# Patient Record
Sex: Female | Born: 1975
Health system: Southern US, Community
[De-identification: ages and names within clinical notes are randomized; demographics above are authoritative.]

## PROBLEM LIST (undated history)

## (undated) ENCOUNTER — Inpatient Hospital Stay (HOSPITAL_COMMUNITY): Payer: Self-pay

## (undated) DIAGNOSIS — K219 Gastro-esophageal reflux disease without esophagitis: Secondary | ICD-10-CM

## (undated) DIAGNOSIS — M797 Fibromyalgia: Secondary | ICD-10-CM

## (undated) DIAGNOSIS — D509 Iron deficiency anemia, unspecified: Secondary | ICD-10-CM

## (undated) DIAGNOSIS — K5903 Drug induced constipation: Secondary | ICD-10-CM

## (undated) DIAGNOSIS — E041 Nontoxic single thyroid nodule: Secondary | ICD-10-CM

## (undated) DIAGNOSIS — J45909 Unspecified asthma, uncomplicated: Secondary | ICD-10-CM

## (undated) DIAGNOSIS — Z8719 Personal history of other diseases of the digestive system: Secondary | ICD-10-CM

## (undated) DIAGNOSIS — G43009 Migraine without aura, not intractable, without status migrainosus: Secondary | ICD-10-CM

## (undated) DIAGNOSIS — M51369 Other intervertebral disc degeneration, lumbar region without mention of lumbar back pain or lower extremity pain: Secondary | ICD-10-CM

## (undated) DIAGNOSIS — G894 Chronic pain syndrome: Secondary | ICD-10-CM

## (undated) DIAGNOSIS — I10 Essential (primary) hypertension: Secondary | ICD-10-CM

## (undated) DIAGNOSIS — Z973 Presence of spectacles and contact lenses: Secondary | ICD-10-CM

## (undated) DIAGNOSIS — R102 Pelvic and perineal pain: Secondary | ICD-10-CM

## (undated) DIAGNOSIS — N939 Abnormal uterine and vaginal bleeding, unspecified: Secondary | ICD-10-CM

## (undated) DIAGNOSIS — M542 Cervicalgia: Secondary | ICD-10-CM

## (undated) DIAGNOSIS — M1990 Unspecified osteoarthritis, unspecified site: Secondary | ICD-10-CM

## (undated) DIAGNOSIS — G43909 Migraine, unspecified, not intractable, without status migrainosus: Secondary | ICD-10-CM

## (undated) DIAGNOSIS — D259 Leiomyoma of uterus, unspecified: Secondary | ICD-10-CM

## (undated) DIAGNOSIS — E782 Mixed hyperlipidemia: Secondary | ICD-10-CM

## (undated) DIAGNOSIS — F411 Generalized anxiety disorder: Secondary | ICD-10-CM

## (undated) DIAGNOSIS — N83201 Unspecified ovarian cyst, right side: Secondary | ICD-10-CM

## (undated) DIAGNOSIS — E119 Type 2 diabetes mellitus without complications: Secondary | ICD-10-CM

## (undated) DIAGNOSIS — F419 Anxiety disorder, unspecified: Secondary | ICD-10-CM

## (undated) HISTORY — DX: Type 2 diabetes mellitus without complications: E11.9

## (undated) HISTORY — PX: SPINE SURGERY: SHX786

## (undated) HISTORY — DX: Migraine, unspecified, not intractable, without status migrainosus: G43.909

---
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
## 1998-09-30 ENCOUNTER — Encounter: Admission: RE | Admit: 1998-09-30 | Discharge: 1998-12-29 | Payer: Self-pay | Admitting: Family Medicine

## 1998-10-15 ENCOUNTER — Ambulatory Visit (HOSPITAL_COMMUNITY): Admission: RE | Admit: 1998-10-15 | Discharge: 1998-10-15 | Payer: Self-pay | Admitting: Family Medicine

## 1998-10-15 ENCOUNTER — Encounter: Payer: Self-pay | Admitting: Family Medicine

## 1998-12-12 ENCOUNTER — Ambulatory Visit (HOSPITAL_COMMUNITY): Admission: RE | Admit: 1998-12-12 | Discharge: 1998-12-12 | Payer: Self-pay | Admitting: Neurosurgery

## 1998-12-24 ENCOUNTER — Ambulatory Visit (HOSPITAL_COMMUNITY): Admission: RE | Admit: 1998-12-24 | Discharge: 1998-12-24 | Payer: Self-pay | Admitting: Neurosurgery

## 1999-01-07 ENCOUNTER — Ambulatory Visit (HOSPITAL_COMMUNITY): Admission: RE | Admit: 1999-01-07 | Discharge: 1999-01-07 | Payer: Self-pay | Admitting: Neurosurgery

## 2002-09-10 ENCOUNTER — Encounter: Admission: RE | Admit: 2002-09-10 | Discharge: 2002-10-05 | Payer: Self-pay | Admitting: Family Medicine

## 2003-01-30 ENCOUNTER — Other Ambulatory Visit: Admission: RE | Admit: 2003-01-30 | Discharge: 2003-01-30 | Payer: Self-pay | Admitting: Obstetrics and Gynecology

## 2003-06-07 ENCOUNTER — Other Ambulatory Visit: Admission: RE | Admit: 2003-06-07 | Discharge: 2003-06-07 | Payer: Self-pay | Admitting: Obstetrics and Gynecology

## 2003-09-18 ENCOUNTER — Other Ambulatory Visit: Admission: RE | Admit: 2003-09-18 | Discharge: 2003-09-18 | Payer: Self-pay | Admitting: Obstetrics and Gynecology

## 2004-03-04 ENCOUNTER — Ambulatory Visit (HOSPITAL_COMMUNITY): Admission: RE | Admit: 2004-03-04 | Discharge: 2004-03-04 | Payer: Self-pay | Admitting: Family Medicine

## 2004-03-16 ENCOUNTER — Encounter: Admission: RE | Admit: 2004-03-16 | Discharge: 2004-06-14 | Payer: Self-pay | Admitting: Family Medicine

## 2004-03-30 ENCOUNTER — Other Ambulatory Visit: Admission: RE | Admit: 2004-03-30 | Discharge: 2004-03-30 | Payer: Self-pay | Admitting: Family Medicine

## 2004-05-07 ENCOUNTER — Ambulatory Visit (HOSPITAL_COMMUNITY): Admission: RE | Admit: 2004-05-07 | Discharge: 2004-05-07 | Payer: Self-pay | Admitting: Family Medicine

## 2004-05-26 ENCOUNTER — Ambulatory Visit (HOSPITAL_COMMUNITY): Admission: RE | Admit: 2004-05-26 | Discharge: 2004-05-26 | Payer: Self-pay | Admitting: Family Medicine

## 2004-07-15 ENCOUNTER — Encounter: Admission: RE | Admit: 2004-07-15 | Discharge: 2004-09-25 | Payer: Self-pay | Admitting: Family Medicine

## 2004-08-14 ENCOUNTER — Ambulatory Visit (HOSPITAL_COMMUNITY): Admission: RE | Admit: 2004-08-14 | Discharge: 2004-08-14 | Payer: Self-pay | Admitting: Family Medicine

## 2004-09-03 ENCOUNTER — Ambulatory Visit: Payer: Self-pay | Admitting: Obstetrics & Gynecology

## 2004-09-10 ENCOUNTER — Ambulatory Visit: Payer: Self-pay | Admitting: Obstetrics & Gynecology

## 2004-09-14 ENCOUNTER — Other Ambulatory Visit: Admission: RE | Admit: 2004-09-14 | Discharge: 2004-09-14 | Payer: Self-pay | Admitting: Family Medicine

## 2004-09-17 ENCOUNTER — Ambulatory Visit: Payer: Self-pay | Admitting: *Deleted

## 2004-09-24 ENCOUNTER — Ambulatory Visit: Payer: Self-pay | Admitting: *Deleted

## 2004-09-24 ENCOUNTER — Inpatient Hospital Stay (HOSPITAL_COMMUNITY): Admission: AD | Admit: 2004-09-24 | Discharge: 2004-09-27 | Payer: Self-pay | Admitting: Family Medicine

## 2004-09-25 DIAGNOSIS — O1493 Unspecified pre-eclampsia, third trimester: Secondary | ICD-10-CM

## 2004-11-10 ENCOUNTER — Other Ambulatory Visit: Admission: RE | Admit: 2004-11-10 | Discharge: 2004-11-10 | Payer: Self-pay | Admitting: Family Medicine

## 2005-11-08 ENCOUNTER — Encounter: Admission: RE | Admit: 2005-11-08 | Discharge: 2005-11-16 | Payer: Self-pay | Admitting: Neurosurgery

## 2005-12-08 ENCOUNTER — Encounter: Admission: RE | Admit: 2005-12-08 | Discharge: 2005-12-08 | Payer: Self-pay | Admitting: Neurosurgery

## 2006-02-17 ENCOUNTER — Other Ambulatory Visit: Admission: RE | Admit: 2006-02-17 | Discharge: 2006-02-17 | Payer: Self-pay | Admitting: Family Medicine

## 2006-07-10 ENCOUNTER — Emergency Department (HOSPITAL_COMMUNITY): Admission: EM | Admit: 2006-07-10 | Discharge: 2006-07-10 | Payer: Self-pay | Admitting: Emergency Medicine

## 2006-09-30 ENCOUNTER — Emergency Department (HOSPITAL_COMMUNITY): Admission: EM | Admit: 2006-09-30 | Discharge: 2006-09-30 | Payer: Self-pay | Admitting: Emergency Medicine

## 2006-11-22 ENCOUNTER — Inpatient Hospital Stay (HOSPITAL_COMMUNITY): Admission: RE | Admit: 2006-11-22 | Discharge: 2006-11-25 | Payer: Self-pay | Admitting: Orthopedic Surgery

## 2006-11-22 ENCOUNTER — Encounter (INDEPENDENT_AMBULATORY_CARE_PROVIDER_SITE_OTHER): Payer: Self-pay | Admitting: *Deleted

## 2006-11-22 HISTORY — PX: POSTERIOR FUSION LUMBAR SPINE: SUR632

## 2007-06-01 ENCOUNTER — Encounter: Admission: RE | Admit: 2007-06-01 | Discharge: 2007-06-01 | Payer: Self-pay | Admitting: Orthopedic Surgery

## 2007-07-03 ENCOUNTER — Encounter: Admission: RE | Admit: 2007-07-03 | Discharge: 2007-07-03 | Payer: Self-pay | Admitting: Orthopedic Surgery

## 2008-04-29 ENCOUNTER — Encounter
Admission: RE | Admit: 2008-04-29 | Discharge: 2008-06-24 | Payer: Self-pay | Admitting: Physical Medicine & Rehabilitation

## 2008-04-30 ENCOUNTER — Ambulatory Visit: Payer: Self-pay | Admitting: Physical Medicine & Rehabilitation

## 2008-05-27 ENCOUNTER — Ambulatory Visit: Payer: Self-pay | Admitting: Physical Medicine & Rehabilitation

## 2008-06-24 ENCOUNTER — Ambulatory Visit: Payer: Self-pay | Admitting: Physical Medicine & Rehabilitation

## 2008-07-16 ENCOUNTER — Encounter
Admission: RE | Admit: 2008-07-16 | Discharge: 2008-10-14 | Payer: Self-pay | Admitting: Physical Medicine & Rehabilitation

## 2008-08-01 ENCOUNTER — Ambulatory Visit: Payer: Self-pay | Admitting: Physical Medicine & Rehabilitation

## 2008-11-05 ENCOUNTER — Ambulatory Visit: Payer: Self-pay | Admitting: Physical Medicine & Rehabilitation

## 2008-11-05 ENCOUNTER — Encounter
Admission: RE | Admit: 2008-11-05 | Discharge: 2009-02-03 | Payer: Self-pay | Admitting: Physical Medicine & Rehabilitation

## 2008-11-12 ENCOUNTER — Encounter
Admission: RE | Admit: 2008-11-12 | Discharge: 2009-01-14 | Payer: Self-pay | Admitting: Physical Medicine & Rehabilitation

## 2008-12-05 ENCOUNTER — Ambulatory Visit: Payer: Self-pay | Admitting: Physical Medicine & Rehabilitation

## 2009-02-18 ENCOUNTER — Encounter: Admission: RE | Admit: 2009-02-18 | Discharge: 2009-04-24 | Payer: Self-pay | Admitting: Neurology

## 2009-02-28 ENCOUNTER — Encounter
Admission: RE | Admit: 2009-02-28 | Discharge: 2009-03-05 | Payer: Self-pay | Admitting: Physical Medicine & Rehabilitation

## 2009-03-04 ENCOUNTER — Ambulatory Visit: Payer: Self-pay | Admitting: Physical Medicine & Rehabilitation

## 2009-06-06 ENCOUNTER — Ambulatory Visit: Payer: Self-pay | Admitting: Physical Medicine & Rehabilitation

## 2009-06-06 ENCOUNTER — Encounter
Admission: RE | Admit: 2009-06-06 | Discharge: 2009-06-06 | Payer: Self-pay | Admitting: Physical Medicine & Rehabilitation

## 2009-09-22 ENCOUNTER — Encounter
Admission: RE | Admit: 2009-09-22 | Discharge: 2009-09-24 | Payer: Self-pay | Admitting: Physical Medicine & Rehabilitation

## 2009-09-23 ENCOUNTER — Ambulatory Visit: Payer: Self-pay | Admitting: Physical Medicine & Rehabilitation

## 2009-10-04 HISTORY — PX: ANTERIOR FUSION CERVICAL SPINE: SUR626

## 2009-11-05 ENCOUNTER — Encounter: Admission: RE | Admit: 2009-11-05 | Discharge: 2009-11-05 | Payer: Self-pay | Admitting: Orthopedic Surgery

## 2009-11-11 ENCOUNTER — Encounter
Admission: RE | Admit: 2009-11-11 | Discharge: 2010-02-09 | Payer: Self-pay | Admitting: Physical Medicine & Rehabilitation

## 2009-12-19 ENCOUNTER — Encounter: Admission: RE | Admit: 2009-12-19 | Discharge: 2009-12-19 | Payer: Self-pay | Admitting: Orthopedic Surgery

## 2009-12-23 ENCOUNTER — Encounter (INDEPENDENT_AMBULATORY_CARE_PROVIDER_SITE_OTHER): Payer: Self-pay | Admitting: Orthopedic Surgery

## 2009-12-23 ENCOUNTER — Inpatient Hospital Stay (HOSPITAL_COMMUNITY): Admission: RE | Admit: 2009-12-23 | Discharge: 2009-12-24 | Payer: Self-pay | Admitting: Orthopedic Surgery

## 2009-12-23 HISTORY — PX: ANTERIOR CERVICAL DECOMP/DISCECTOMY FUSION: SHX1161

## 2010-10-04 HISTORY — PX: CERVICAL SPINE SURGERY: SHX589

## 2010-10-25 ENCOUNTER — Encounter: Payer: Self-pay | Admitting: Family Medicine

## 2010-10-28 ENCOUNTER — Encounter
Admission: RE | Admit: 2010-10-28 | Discharge: 2010-10-28 | Payer: Self-pay | Source: Home / Self Care | Attending: Orthopaedic Surgery | Admitting: Orthopaedic Surgery

## 2010-12-08 ENCOUNTER — Ambulatory Visit: Payer: PRIVATE HEALTH INSURANCE | Attending: Orthopedic Surgery | Admitting: Physical Therapy

## 2010-12-08 ENCOUNTER — Other Ambulatory Visit: Payer: Self-pay | Admitting: Orthopedic Surgery

## 2010-12-08 DIAGNOSIS — R5381 Other malaise: Secondary | ICD-10-CM | POA: Insufficient documentation

## 2010-12-08 DIAGNOSIS — M542 Cervicalgia: Secondary | ICD-10-CM

## 2010-12-08 DIAGNOSIS — M25519 Pain in unspecified shoulder: Secondary | ICD-10-CM | POA: Insufficient documentation

## 2010-12-08 DIAGNOSIS — M6281 Muscle weakness (generalized): Secondary | ICD-10-CM | POA: Insufficient documentation

## 2010-12-08 DIAGNOSIS — IMO0001 Reserved for inherently not codable concepts without codable children: Secondary | ICD-10-CM | POA: Insufficient documentation

## 2010-12-10 ENCOUNTER — Ambulatory Visit
Admission: RE | Admit: 2010-12-10 | Discharge: 2010-12-10 | Disposition: A | Discharge status: Home / Self Care | Payer: PRIVATE HEALTH INSURANCE | Source: Ambulatory Visit | Attending: Orthopedic Surgery | Admitting: Orthopedic Surgery

## 2010-12-10 DIAGNOSIS — M542 Cervicalgia: Secondary | ICD-10-CM

## 2010-12-11 ENCOUNTER — Ambulatory Visit: Payer: PRIVATE HEALTH INSURANCE | Admitting: Physical Therapy

## 2010-12-14 ENCOUNTER — Ambulatory Visit: Payer: PRIVATE HEALTH INSURANCE | Admitting: Physical Therapy

## 2010-12-16 ENCOUNTER — Ambulatory Visit: Payer: PRIVATE HEALTH INSURANCE | Admitting: Physical Therapy

## 2010-12-21 ENCOUNTER — Ambulatory Visit: Payer: PRIVATE HEALTH INSURANCE | Admitting: Physical Therapy

## 2010-12-22 ENCOUNTER — Encounter: Payer: PRIVATE HEALTH INSURANCE | Admitting: Physical Therapy

## 2010-12-23 ENCOUNTER — Ambulatory Visit: Payer: PRIVATE HEALTH INSURANCE | Admitting: Physical Therapy

## 2010-12-27 LAB — COMPREHENSIVE METABOLIC PANEL
AST: 20 U/L (ref 0–37)
CO2: 24 mEq/L (ref 19–32)
Chloride: 107 mEq/L (ref 96–112)
GFR calc Af Amer: >60 mL/min (ref >60)
GFR calc non Af Amer: >60 mL/min (ref >60)
Glucose, Bld: 74 mg/dL (ref 70–99)
Sodium: 139 mEq/L (ref 135–145)

## 2010-12-27 LAB — URINALYSIS, ROUTINE W REFLEX MICROSCOPIC
Bilirubin Urine: NEGATIVE
Ketones, ur: NEGATIVE mg/dL
Nitrite: POSITIVE — AB
Specific Gravity, Urine: 1.015 (ref 1.005–1.030)
Urobilinogen, UA: 1 mg/dL (ref 0.0–1.0)

## 2010-12-27 LAB — CBC
Hemoglobin: 13.3 g/dL (ref 12.0–15.0)
MCHC: 33.8 g/dL (ref 30.0–36.0)
Platelets: 368 10*3/uL (ref 150–400)
RBC: 4.67 MIL/uL (ref 3.87–5.11)
RDW: 13.5 % (ref 11.5–15.5)
WBC: 8 10*3/uL (ref 4.0–10.5)

## 2010-12-27 LAB — DIFFERENTIAL
Eosinophils Absolute: 0.2 10*3/uL (ref 0.0–0.7)
Lymphocytes Relative: 37 % (ref 12–46)
Lymphs Abs: 2.9 10*3/uL (ref 0.7–4.0)
Neutrophils Relative %: 52 % (ref 43–77)

## 2010-12-27 LAB — BASIC METABOLIC PANEL
BUN: 7 mg/dL (ref 6–23)
CO2: 25 mEq/L (ref 19–32)
Calcium: 8.7 mg/dL (ref 8.4–10.5)
Creatinine, Ser: 0.81 mg/dL (ref 0.4–1.2)
Glucose, Bld: 162 mg/dL — ABNORMAL HIGH (ref 70–99)

## 2010-12-27 LAB — GLUCOSE, CAPILLARY
Glucose-Capillary: 85 mg/dL (ref 70–99)
Glucose-Capillary: 87 mg/dL (ref 70–99)
Glucose-Capillary: 95 mg/dL (ref 70–99)

## 2010-12-27 LAB — URINE MICROSCOPIC-ADD ON

## 2010-12-27 LAB — TYPE AND SCREEN: Antibody Screen: NEGATIVE

## 2010-12-27 LAB — APTT: aPTT: 29 seconds (ref 24–37)

## 2010-12-27 LAB — VITAMIN D 25 HYDROXY (VIT D DEFICIENCY, FRACTURES): Vit D, 25-Hydroxy: 24 ng/mL — ABNORMAL LOW (ref 30–89)

## 2010-12-27 LAB — HEMOGLOBIN A1C
Hgb A1c MFr Bld: 7.2 % — ABNORMAL HIGH (ref 4.6–6.1)
Mean Plasma Glucose: 160 mg/dL

## 2011-01-25 ENCOUNTER — Encounter: Payer: Self-pay | Admitting: Nurse Practitioner

## 2011-01-25 DIAGNOSIS — E1165 Type 2 diabetes mellitus with hyperglycemia: Secondary | ICD-10-CM

## 2011-01-25 DIAGNOSIS — M545 Low back pain: Secondary | ICD-10-CM | POA: Insufficient documentation

## 2011-01-25 DIAGNOSIS — E118 Type 2 diabetes mellitus with unspecified complications: Secondary | ICD-10-CM | POA: Insufficient documentation

## 2011-01-25 DIAGNOSIS — G43909 Migraine, unspecified, not intractable, without status migrainosus: Secondary | ICD-10-CM

## 2011-01-25 DIAGNOSIS — I10 Essential (primary) hypertension: Secondary | ICD-10-CM

## 2011-01-25 DIAGNOSIS — K589 Irritable bowel syndrome without diarrhea: Secondary | ICD-10-CM | POA: Insufficient documentation

## 2011-02-12 ENCOUNTER — Ambulatory Visit (HOSPITAL_COMMUNITY)
Admission: RE | Admit: 2011-02-12 | Discharge: 2011-02-12 | Disposition: A | Discharge status: Home / Self Care | Payer: PRIVATE HEALTH INSURANCE | Source: Ambulatory Visit | Attending: Orthopedic Surgery | Admitting: Orthopedic Surgery

## 2011-02-12 ENCOUNTER — Encounter (HOSPITAL_COMMUNITY)
Admission: RE | Admit: 2011-02-12 | Discharge: 2011-02-12 | Disposition: A | Discharge status: Home / Self Care | Payer: PRIVATE HEALTH INSURANCE | Source: Ambulatory Visit | Attending: Orthopedic Surgery | Admitting: Orthopedic Surgery

## 2011-02-12 ENCOUNTER — Other Ambulatory Visit (HOSPITAL_COMMUNITY): Payer: Self-pay | Admitting: Orthopedic Surgery

## 2011-02-12 DIAGNOSIS — Z01818 Encounter for other preprocedural examination: Secondary | ICD-10-CM | POA: Insufficient documentation

## 2011-02-12 DIAGNOSIS — M4802 Spinal stenosis, cervical region: Secondary | ICD-10-CM | POA: Insufficient documentation

## 2011-02-12 DIAGNOSIS — Z01812 Encounter for preprocedural laboratory examination: Secondary | ICD-10-CM | POA: Insufficient documentation

## 2011-02-12 DIAGNOSIS — M5020 Other cervical disc displacement, unspecified cervical region: Secondary | ICD-10-CM

## 2011-02-12 LAB — URINALYSIS, ROUTINE W REFLEX MICROSCOPIC
Ketones, ur: NEGATIVE mg/dL
Nitrite: NEGATIVE
Protein, ur: NEGATIVE mg/dL
Urobilinogen, UA: 0.2 mg/dL (ref 0.0–1.0)
pH: 5.5 (ref 5.0–8.0)

## 2011-02-12 LAB — DIFFERENTIAL
Basophils Relative: 1 % (ref 0–1)
Eosinophils Absolute: 0.2 10*3/uL (ref 0.0–0.7)
Eosinophils Relative: 2 % (ref 0–5)
Monocytes Relative: 7 % (ref 3–12)
Neutrophils Relative %: 50 % (ref 43–77)

## 2011-02-12 LAB — COMPREHENSIVE METABOLIC PANEL
AST: 36 U/L (ref 0–37)
Albumin: 4.3 g/dL (ref 3.5–5.2)
Alkaline Phosphatase: 103 U/L (ref 39–117)
Chloride: 98 mEq/L (ref 96–112)
GFR calc Af Amer: >60 mL/min (ref >60)
Potassium: 4.1 mEq/L (ref 3.5–5.1)
Sodium: 140 mEq/L (ref 135–145)
Total Bilirubin: 0.2 mg/dL — ABNORMAL LOW (ref 0.3–1.2)
Total Protein: 7.5 g/dL (ref 6.0–8.3)

## 2011-02-12 LAB — CBC
MCH: 26.2 pg (ref 26.0–34.0)
MCHC: 32.4 g/dL (ref 30.0–36.0)
Platelets: 438 10*3/uL — ABNORMAL HIGH (ref 150–400)
RBC: 4.65 MIL/uL (ref 3.87–5.11)
RDW: 13.2 % (ref 11.5–15.5)

## 2011-02-12 LAB — HCG, SERUM, QUALITATIVE: Preg, Serum: NEGATIVE

## 2011-02-16 ENCOUNTER — Inpatient Hospital Stay (HOSPITAL_COMMUNITY)
Admission: RE | Admit: 2011-02-16 | Discharge: 2011-02-17 | DRG: 491 | Disposition: A | Discharge status: Home / Self Care | Payer: PRIVATE HEALTH INSURANCE | Source: Ambulatory Visit | Attending: Orthopedic Surgery | Admitting: Orthopedic Surgery

## 2011-02-16 ENCOUNTER — Inpatient Hospital Stay (HOSPITAL_COMMUNITY): Payer: PRIVATE HEALTH INSURANCE

## 2011-02-16 DIAGNOSIS — Z880 Allergy status to penicillin: Secondary | ICD-10-CM

## 2011-02-16 DIAGNOSIS — E119 Type 2 diabetes mellitus without complications: Secondary | ICD-10-CM | POA: Diagnosis present

## 2011-02-16 DIAGNOSIS — I10 Essential (primary) hypertension: Secondary | ICD-10-CM | POA: Diagnosis present

## 2011-02-16 DIAGNOSIS — J45909 Unspecified asthma, uncomplicated: Secondary | ICD-10-CM | POA: Diagnosis present

## 2011-02-16 DIAGNOSIS — E669 Obesity, unspecified: Secondary | ICD-10-CM | POA: Diagnosis present

## 2011-02-16 DIAGNOSIS — M5020 Other cervical disc displacement, unspecified cervical region: Principal | ICD-10-CM | POA: Diagnosis present

## 2011-02-16 HISTORY — PX: CERVICAL SPINE SURGERY: SHX589

## 2011-02-16 LAB — GLUCOSE, CAPILLARY
Glucose-Capillary: 127 mg/dL — ABNORMAL HIGH (ref 70–99)
Glucose-Capillary: 187 mg/dL — ABNORMAL HIGH (ref 70–99)

## 2011-02-16 NOTE — Procedures (Signed)
NAME:  Natalie Yoder, Natalie Yoder                ACCOUNT NO.:  [id number]   MEDICAL RECORD NO.:  [id number]          PATIENT TYPE:  REC   LOCATION:  [facility]                          FACILITY:  [facility]   PHYSICIAN:  Erick Colace, M.D.DATE OF BIRTH:  1976/04/21   DATE OF PROCEDURE:  05/27/2008  DATE OF DISCHARGE:                               OPERATIVE REPORT   PROCEDURE:  Right sacroiliac injection under fluoroscopic guidance   INDICATIONS:  Lumbar post-laminectomy pain in the sacroiliac area and  history of lumbar fusion.   Informed consent obtained after describing risks and benefits of the  procedure with the patient.  These include bleeding, bruising,  infection, and loss of bowel or bladder function.  She elects to proceed  and has given written consent.  The patient does have pain that is only  partially responsive to medication management and other conservative  care.   The patient was placed prone on the fluoroscopy table.  Betadine  prepped, sterile draped.  A 25-gauge 1-1/2-inch needle was used to  anesthetize the skin and subcu tissue with 1% lidocaine x2 mL.  Then, a  25-gauge 3-1/2-inch spinal needle was inserted under the fluoroscopic  guidance, starting at the right SI joint.  The needle was, however, too  short to reach the intended target and therefore switched out to 22-  gauge 5-inch, which was inserted at the right SI joint.  AP, lateral,  and oblique imaging utilized.  Omnipaque 180 demonstrated inferior  aspect calcification, but also a capsular tear in the anterior and  inferior.  Then, a solution containing 1 mL of 2% MPF lidocaine and 1.5  mL of 40 mg/mL Depo-Medrol injected.  The patient tolerated the  procedure well.  Post-injection instructions were given.  Post-procedure  instructions were given.  I will see her back.  Pre-injection pain level  is 7/10.  Post-injection is 2-3/10.  We will repeat in 1 month.      Erick Colace, M.D.  Electronically  Signed     AEK/MEDQ  D:  05/27/2008 12:07:35  T:  05/28/2008 03:40:04  Job:  [id number]   cc:   Nelda Severe, MD  Fax: [phone]

## 2011-02-16 NOTE — Procedures (Signed)
NAME:  Natalie Yoder, Natalie Yoder                ACCOUNT NO.:  [id number]   MEDICAL RECORD NO.:  [id number]          PATIENT TYPE:  REC   LOCATION:  [facility]                          FACILITY:  [facility]   PHYSICIAN:  Erick Colace, M.D.DATE OF BIRTH:  05-04-1976   DATE OF PROCEDURE:  11/05/2008  DATE OF DISCHARGE:                               OPERATIVE REPORT   PROCEDURE:  This is a right hip trochanteric bursa injection.   INDICATION:  Trochanteric bursitis.  Pain was only partial process to  medication management and other conservative care, interferes with  sleep.   Informed consent was obtained after describing the risks and benefits of  the procedure with the patient.  These include bleeding, bruising,  infection.  She elects to proceed.  The patient placed in a left lateral  decubitus position.  The area marked and prepped with Betadine, alcohol,  entered with 25-gauge 3-inch spinal needle inserted to bone contact and  slightly withdrawn.  Solution containing 1 mL of 40 mg/mL Depo-Medrol  and 4 mL of 1% lidocaine were injected, after negative draw back of  blood.  The patient tolerated the procedure well.  Pre and post-  injection instructions given.  Return in 1 month for followup.  We will  send to Physical Therapy for tensor fascia lata stretching.      Erick Colace, M.D.  Electronically Signed     AEK/MEDQ  D:  11/05/2008 17:10:11  T:  11/06/2008 05:03:52  Job:  [id number]

## 2011-02-16 NOTE — Assessment & Plan Note (Signed)
Ms. Peraza is a 35 year old female with lumbar postlaminectomy syndrome  as well as right chronic trochanteric bursitis.  She has finished up  physical therapy at [facility].  She is quite happy with  how she is doing overall.  Her pain has been well controlled.  She has  been walking without the cane now, at times.  She still states the pain  is 8/10, but she is independent with all self-care, the only thing that  she has trouble with the shopping.  Her sleep is poor.  The pain is  described as constant aching, improves with heat and medications.  Relief from meds is fair.   REVIEW OF SYSTEMS:  Otherwise negative.   PHYSICAL EXAMINATION:  VITAL SIGNS:  Blood pressure is 117/75, pulse 99,  respirations 80, and O2 saturation is 98% room air.  GENERAL:  No acute distress.  Orientation x3.  Affect bright and alert.  Ambulation was with a cane.  EXTREMITIES:  Without edema.  Her motor strength is normal in the upper  and lower extremities.  Lower extremity range of motion is normal.  She  has mild tenderness over the right trochanteric bursa.  She has mild  tenderness in the low back area.  She has no evidence of scoliosis.   IMPRESSION:  1. Lumbar postlaminectomy syndrome.  2. Right trochanteric bursitis, improved.   PLAN:  1. Continue her current medications, which consist of tramadol 50 mg      p.o. t.i.d. during the day and Darvocet-N 100 2 p.o. at bedtime.  2. Continue Voltaren Gel to hip q.i.d.  3. Continue home exercise program.  Encouraged ambulation.   I will see her back in 3 months for evaluation.      Erick Colace, M.D.  Electronically Signed     AEK/MedQ  D:  03/04/2009 14:40:25  T:  03/05/2009 04:48:49  Job #:  [id number]

## 2011-02-16 NOTE — Assessment & Plan Note (Signed)
Natalie Yoder follows up today.  She indicates that she did have much relief  with the medial branch blocks on the right, however, immediately getting  up table, she went from 7-0, but she states that even just walking to be  checked out, her pain returned.  She has had no new medical issues in  the interval time.  Her main complaint in addition to her back pain in  this visit is problem falling asleep.  Denies any excessive caffeine  intake.  Her average pain is 8/10 to 9/10.  Her sleep is poor and it is  a problem falling asleep as well as to a lesser extent staying asleep.   Pain is worse with walking, bending.  Her ambulation is with a cane  sometimes uses no cane.  She thinks she just feels more secure with the  cane, it does not really help with her pain.  She can climb steps.  She  drives.  She needs in assist with certain household duties and shopping.  She has been on disability since June 2009.   PAST SURGICAL HISTORY:  Lumbar fusion in 2008, L5-S1.   Other past medical history, migraine headaches seen by Neurology for  this.   SOCIAL HISTORY:  Lives with her 35-year-old daughter   Review of systems is otherwise negative.   PHYSICAL EXAMINATION:  VITAL SIGNS:  Blood pressure 148/77, pulse 92,  respirations 18, O2 100% on room air.  GENERAL:  No acute stress.  Orientation x3.  Affect is tired, but  otherwise appropriate.  EXTREMITIES:  His upper and lower extremity strength is normal.  Deep  tendon reflexes are 1+ bilateral upper and lower extremities.  Upper and  lower extremity range of motion, normal.  She has some tenderness to  palpation on right hip area over the greater trochanter.  No pain with  hip internal-external rotation.   IMPRESSION:  1. Lumbar post-laminectomy syndrome.  2. Right trochanteric bursitis.   PLAN:  We will go ahead and inject her right hip today.  It her pain  does persist despite medication management that includes tramadol 50  t.i.d. and  Darvocet-N 100 two nightly.   We will continue current meds.  We discussed trying something stronger  at night such as hydrocodone; however, she states that she gets  nauseated with the stronger narcotics.   Certainly no evidence of drug-seeking behavior.  If the injection does  not help in terms of relieving pain at night.  Consider addition of  trazodone.      Erick Colace, M.D.  Electronically Signed     AEK/MedQ  D:  11/05/2008 17:08:08  T:  11/06/2008 05:06:42  Job #:  [id number]

## 2011-02-16 NOTE — Procedures (Signed)
NAME:  Natalie Yoder, Natalie Yoder                ACCOUNT NO.:  [id number]   MEDICAL RECORD NO.:  [id number]          PATIENT TYPE:  REC   LOCATION:  [facility]                          FACILITY:  [facility]   PHYSICIAN:  Erick Colace, M.D.DATE OF BIRTH:  [birth year]   DATE OF PROCEDURE:  DATE OF DISCHARGE:                               OPERATIVE REPORT   PROCEDURE:  Right sacroiliac injection under fluoroscopic guidance.   INDICATIONS:  Lumbar postlaminectomy pain in the sacroiliac area,  history of lumbar fusion.   Informed consent was obtained after describing risks and benefits of the  procedure to the patient.  These include bleeding, bruising, infection,  temporary or permanent paralysis.  She elects to proceed and has given  written consent.  Her pain was only partially responsive to medication  management and other conservative care.  She had relief of right buttock  pain following sacroiliac injection last month, went from 7 to a 3.   The patient was placed prone on the fluoroscopy table.  Betadine prep,  sterile drape.  A 25-gauge 1-1/2-inch needle was used to anesthetize the  skin and subcu tissue, 1% lidocaine x2 mL.  Then, a 22-gauge 5-inch  spinal needle was inserted under fluoroscopic guidance into the right  sacroiliac joint.  AP, lateral, and oblique imaging utilized.  Omnipaque  180 demonstrated no intravascular uptake.  There was evidence of  capsular tear anterior and inferior.  Solution containing 1 mL of 2% MPF  lidocaine and 1 mL of 40 mg/mL Depo-Medrol injected.  The patient  tolerated the procedure well.  Post injection instructions were given.  We will have the patient return in 1 month for facet injections followed  by PT.      Erick Colace, M.D.  Electronically Signed     AEK/MEDQ  D:  06/24/2008 12:42:39  T:  06/25/2008 01:39:02  Job:  [id number]

## 2011-02-16 NOTE — Assessment & Plan Note (Signed)
A 35 year old female with lumbar post laminectomy syndrome as well as  chronic right trochanteric bursitis.  She has been going through  physical therapy at [facility].  She has had 9  treatments.  She has been having fair relief with this, some increase in  mobility.  She is doing some increased exercise such as stationary  bicycling which flares up her pain, causes a little bit of shakiness in  her right hip and thigh muscles.   PAST SURGICAL HISTORY:  Lumbar fusion 2008, at L5-S1 level.   CURRENT PAIN MEDICATIONS:  1. Tramadol 50 mg p.o. t.i.d. during the day.  2. Darvocet-N 100 two p.o. nightly.  3. More recently she was started Voltaren Gel which has been helping      on her hip q.i.d.   Examination, obese female in no acute distress.  Mood and affect  appropriate.  Orientation x3.   Extremity, strength in upper and lower extremities are normal.  Her  lower extremity range of motion normal at the hip, knee, and ankle.  She  has tenderness over the right troch bursa.  She has tenderness in the  low back area.   IMPRESSION:  1. Lumbar post laminectomy syndrome.  2. Right trochanteric bursitis somewhat improved.   PLAN:  1. We will continue current medications.  2. We will go ahead and continue with some physical therapy.  She has      trialed some electrical stimulation, we will see if she can get a      home E-Stim device through PT, have her get about 4 more sessions.      I will see her back in a couple months and followup on her      medications.  Discussed the plan with the patient.  She agrees with      the plan.  She does have a form from physical therapy for me to      decide today which I did with some changes in her treatment and      plan.      Erick Colace, M.D.  Electronically Signed     AEK/MedQ  D:  12/05/2008 12:35:33  T:  12/06/2008 00:21:18  Job #:  [id number]

## 2011-02-16 NOTE — Group Therapy Note (Signed)
CONSULT REQUESTED BY:  Nelda Severe, MD   Consult requested for the evaluation of pain status post lumbosacral  fusion.   Ms. Natalie Yoder is a 35 year old African American female who had onset  of pain sometime in January 2007.  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  She came home  one day, sat down, and could not get up because of back pain.  Her pain  prior to her operation was across both the right and left sides of the  back.  Preoperatively, she tried physical therapy as well as epidural  steroid injection, neither of which was successful.  She then had  surgical consultation, initially with Dr. Lovell Sheehan, who did not feel  surgery would be helpful for her condition and then with Dr. Nelda Severe, who scheduled the patient for L5-S1 fusion, transforaminal lumbar  interbody.  She had a discogram preoperatively showing discordant pain  at L4-L5, concordant pain with a little pressure at L5-S1, and  concordant pain with high pressure at L3-L4.  Post-discectomy CT showed  diffuse disruption at L5-S1.  Postoperatively, her pain changed in terms  of location and overall reduced in intensity.  While she sometimes used  the wheelchair preoperatively, she has been able to use her cane  postoperatively.  Her pain is also now more on the right side and down  into the right thigh but not below the knee.  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  She has no plans of returning to work but has goals of being more  active to play with her 40-year-old child.  Her average pain is 6 to 7  out of 10, currently 5-6, described as sharp, stabbing, and interfering  with activity at 8/10 level, worse with walking, bending, and standing;  improves with medications.  Relief from medications is fair.  She uses a  cane.  She drives when she needs to.   She needs some assistance with  household duties and shopping.   REVIEW OF SYSTEMS:  Positive for depression and anxiety.  She also has  review of systems positive for nausea, for which she takes Reglan, and  constipation, for which uses fiber.  Her other physicians include  primary care doctor at [facility] practice, Dr. Paulene Floor.   OTHER PAST MEDICAL HISTORY:  History of headaches, question migraines,  has seen Dr. Nash Shearer but since he left will be seeing another physician  at [facility].  Dr. Nash Shearer has been prescribing Cymbalta,  alprazolam, metoclopramide, sumatriptan, and topiramate.   Other past medical history also significant for diabetes, on metformin,  Actos, and glimepiride.  She is hypertensive, takes atenolol.   For her pain through Dr. Toni Arthurs office she has been taking gabapentin  400 t.i.d., diclofenac 75 b.i.d., and hydrocodone 5/325 q.i.d.  Her last  reported dosages of hydrocodone is on April 30, 2008, which should show  up on the urine drug screen today as well as hydrocodone and  hydromorphone.  Alprazolam should also show up.   SOCIAL HISTORY:  Lives with her parents and her daughter, who is 68 years  old.   FAMILY HISTORY:  Diabetes and high blood pressure.   PHYSICAL EXAMINATION:  Her blood pressure is 130/80, pulse 76,  respirations 20, and O2 sat 100% on room air.  This is an obese female in no acute distress.  Orientation x3.  Affect  is depressed, tired appearing, favors the right lower extremity when she  ambulates using a cane, but she can ambulate without a cane as well.  Her deep tendon reflexes are 2+ bilateral biceps, triceps,  brachioradialis, patellar, and Achilles.  Her sensation is normal,  bilateral C5, C6, C7, C8, T1, L2, L3, L4, L5, and S1 dermatomes.  Her  peripheral pulses are normal.  She has no evidence of peripheral edema.  She has normal range of motion, bilateral upper and lower extremities.  She has normal neck range of motion.  Her  lumbar spine range of motion  is limited to 50% forward flexion, 25% extension, and 25% lateral  bending both to the right and to the left.  She has no new right-sided  pain with lateral bending to either direction.  Her hip has no  tenderness to palpation.  Deep tendon reflexes are normal, bilateral  biceps, triceps, brachioradialis, patellar, and Achilles.   IMPRESSION:  Lumbar postlaminectomy syndrome.  I explained the more  common causes of pain after fusion, as I do believe her pain has changed  post fusion.  She most likely has a sacroiliac arthropathy, and we will  schedule her for a sacroiliac injection to further evaluate for this.  Alternatively, she may have some pain emanating from the right side, L3-  L4 and L4-L5 facets.  She may also have some myofascial pain related to  postoperative state.  In addition, preoperatively she did have  concordant pain but with high pressures at L3-L4, although this is a  less likely pain generator given her pain appears to be lower in the  lumbar area.   Thank you for this interesting consultation.  I will keep you apprised  of her progress.  Once we have a better idea on pain generator, we will  try to tailor a rehabilitation program to maximize her functional  improvement.  We will not prescribe any narcotic analgesics until urine  drug screen is obtained and interpreted.  If it does come back  consistent with medications reported, then I will be able to resume the  prescription of her hydrocodone, with the overall goal of reducing over  time.      Erick Colace, M.D.  Electronically Signed     AEK/MedQ  D:  04/30/2008 17:22:11  T:  05/01/2008 08:16:52  Job #:  [id number]   cc:   Nelda Severe, MD  Fax: [phone]

## 2011-02-16 NOTE — Procedures (Signed)
NAME:  Natalie Yoder, Natalie Yoder                ACCOUNT NO.:  [id number]   MEDICAL RECORD NO.:  [id number]          PATIENT TYPE:  REC   LOCATION:  [facility]                          FACILITY:  [facility]   PHYSICIAN:  Erick Colace, M.D.DATE OF BIRTH:  May 23, 1976   DATE OF PROCEDURE:  DATE OF DISCHARGE:                               OPERATIVE REPORT   PROCEDURES:  Right L5 dorsal ramus injection, right L4 medial branch  block, and right L3 medial branch block.   INDICATIONS:  Post fusion pain.  No improvement after sacroiliac  injection.  L5-S1 fusion with spondylosis above the level of fusion.  Pain interferes with self-care mobility and is only partially responsive  to medication management including narcotic analgesics.    Informed consent was obtained after describing risks and benefits of the  procedure with the patient.  These include bleeding, bruising, and  infections.  She elects to proceed and has given written consent.  The  patient placed prone on the fluoroscopy table.  Betadine prep, sterilely  draped.  A 25-gauge, 1-1/2-inch needle was used to anesthetize the skin  and subcu tissue, 1% lidocaine x2 cc, and 22-gauge, 5-inch spinal needle  was inserted under fluoroscopic guidance, first starting at the right  L4, SAP, and transverse process junction, bone contact made, confirmed  with the lateral imaging.  Omnipaque 180 x0.5 mL demonstrated no  intravascular uptake, then 0.5 mL of solution containing 1 mL of 4 mg/mL  dexamethasone and 2 mL of 2% MPF lidocaine.  Then the right L3, SAP, and  transverse process junction targeted, bone contact made, confirmed with  lateral imaging.  Omnipaque 180 x 0.5 mL demonstrated no intravascular  uptake, and then 0.5 mL of dexamethasone lidocaine solution injected in  the right L2, SAP, and transverse process junction targeted, bone  contact made, confirmed with lateral imaging.  Omnipaque 180 x 0.5 mL  demonstrated no intravascular uptake and 0.5 mL  of dexamethasone  lidocaine solution was injected.  The patient tolerated the procedure  well.  Post injection vitals stable.  Post injection instructions given.      Erick Colace, M.D.  Electronically Signed     AEK/MEDQ  D:  08/01/2008 15:14:43  T:  08/02/2008 03:11:03  Job:  [id number]

## 2011-02-19 NOTE — Op Note (Signed)
NAME:  Natalie Yoder, Natalie Yoder NO.:  [id number]   MEDICAL RECORD NO.:  [id number]          PATIENT TYPE:  INP   LOCATION:  3011                         FACILITY:  [facility]   PHYSICIAN:  Nelda Severe, MD      DATE OF BIRTH:  1976-01-16   DATE OF PROCEDURE:  11/22/2006  DATE OF DISCHARGE:                               OPERATIVE REPORT   SURGEON:  Dr. Nelda Severe.   ASSISTANT:  Lianne Cure, PA-C   PREOPERATIVE DIAGNOSIS:  L5-S1 disk herniation, L5-S1 disk degeneration.   POSTOPERATIVE DIAGNOSIS:  L5-S1 disk herniation, L5-S1 disk  degeneration.   PROCEDURE:  1. L5-S1 left-sided laminectomy and disk excision.  2. Transforaminal interbody lumbar fusion with cage, local graft and      Infuse.  3. Posterolateral fusion right side, autograft and Infuse.  4. Posterior instrumentation with pedicle screws ([place]) L5-S1.   OPERATIVE PROCEDURE:  The patient was placed under general endotracheal  anesthesia.  A gram of vancomycin was infused intravenously.  A Foley  catheter was placed in the bladder.  She was positioned prone on the  [facility] table.  Care was taken to position the upper extremities so as  to avoid hyperflexion and abduction of the shoulders and hyperflexion of  the elbows.  The upper extremities were padded from axilla to hands with  foam.  The thighs, knees and feet were supported on pillows.   The lumbar area was prepped with DuraPrep and draped in rectangular  fashion.  Drapes were secured with Ioban.   A vertical midline incision was made over the lumbosacral level and  carried down through a adipose layer measuring approximately 4 inches in  thickness.  The paraspinal muscles were mobilized bilaterally from L4 to  S1 using cutting current and Cobb elevators.  The transverse process of  L5 and the ala sacrum were exposed bilaterally.   Next we made pedicle holes on the left side.  This was done by removing  a piece of the base of superior  articular process, identifying the  poster pedicle, perforating it and then drilling a hole with 3.5 mm  drill bit through the pedicle into the vertebral body.  The holes were  injected with FloSeal and radiopaque markers placed.   We next did the same thing on the right side.  Cross-table lateral  radiograph after several trials was satisfactory enough to determine  that radiopaque markers were in satisfactory position.   Next we harvested local bone graft from the left-sided lamina of L5 and  inferior articular process as the initial part of the laminectomy, also  to harvest local bone graft.  This was done with a gouge and osteotome.  Subsequently, the laminectomy was completed with a Kerrison rongeur.  The superior articular process of S1 was completely removed.   We then identified the disk space and coagulated.  Multiple epidural  veins.  The S1 nerve root was identified and retracted medially.  The  exiting L5 nerve root but was also identified and a cottonoid patty used  to protect it.  The disk which was quite  narrow was fenestrated.  We  then used curettes and rongeurs to enucleate the disk.  A large amount  of degenerate nucleus was removed from the area just anterior to the  posterior longitudinal ligament.   Next endplate preparation was carried out with various curettes.   By this time we, as mentioned above, radiographs were taken which showed  satisfactory position of the radiopaque markers.  We then placed pedicle  screws at S1 and L5 on the right side and subsequently on the left side.  The disk space was able to be distracted using a distractor on the  screws with the rod in place and some set screws provisionally set but  not tightened.  Further we then were able to impact a trial 7 mm TLIF  cage.   Next, I placed a piece of collagen sponge soaked with Infuse anteriorly  in the disk space, followed by morselized autograft followed by the cage  which had been  packed with collagen sponge soaked with the Infuse.  Cage  was countersunk satisfactorily.  The distraction let off the disk space  and this cage secured.   Next we decorticated the ala of the sacrum on the right side and the  transverse process of L5 on the right side.  The remaining autograft and  BMP were placed posterolaterally on the right side.   Cross-table lateral radiograph was taken, again at least two repeats to  get a decent x-ray to demonstrate satisfactory position of the implants.   A piece of Gelfoam was placed in the disk space posterior to the cage to  try to prevent any BMP from leaking into the laminectomy.   After torquing of the screws, screw couplings, the wound was closed in  layers.  A #15 Blake drain was placed subfascially and the lumbar fascia  closed using continuous #1 Vicryl suture.  Subcutaneous layer was  drained with an eighth inch Hemovac drain and then closed using  interrupted 2-0 Vicryl sutures.  Skin was closed with subcuticular  continuous 3-0 undyed Vicryl suture.  The skin edges were reinforced  with Steri-Strips.  The drains were secured with a #2-0 nylon suture in  basket weave fashion.  The dressing was applied with antibiotic ointment  and secured with OpSite.   Not mentioned above is that each screw was stimulated prior to attaching  the rods.  At each level the current to stimulate the distal EMG  activity was in the safe zone, meaning that it was unlikely there is any  contact between screw threads and nerve root.   The estimated blood loss 250 mL.  Sponge, needle counts were correct.  There no intraoperative complications.      Nelda Severe, MD  Electronically Signed     MT/MEDQ  D:  11/22/2006  T:  11/23/2006  Job:  [id number]

## 2011-02-19 NOTE — H&P (Signed)
NAME:  Natalie Yoder, Natalie Yoder NO.:  [id number]   MEDICAL RECORD NO.:  [id number]          PATIENT TYPE:  INP   LOCATION:  2899                         FACILITY:  [facility]   PHYSICIAN:  Nelda Severe, MD      DATE OF BIRTH:  [birth year]   DATE OF ADMISSION:  11/22/2006  DATE OF DISCHARGE:                              HISTORY & PHYSICAL   This 35 year old woman is being admitted for laminectomy and fusion at  L5-S1.  She had a history and physical done in the office yesterday by  Lianne Cure, P.A.-C.  She had an extensive informed consent process  executed by me at that time.  This included the description of the  likely hospital stay, 3-5 days; the need for a Foley catheter during and  following the surgery; the need for PCA pump; and the need for  mobilization postoperatively.  It is anticipated that could be  discharged home with a walker.  She will follow up in the office about a  month after the surgery and then at about six weekly intervals.  At  about seven months postoperatively, she will have a CT scan to make sure  that she has healed.  She understands that she will have screws and rods  to stabilize her spine and to facilitate fusion.   General complications were discussed as follows:  Death, heart attack,  stroke, phlebitis in the leg or pelvic veins, pulmonary embolus,  pneumonia, infection, hemorrhage sufficient to require transfusion with  tiny risks of HIV, hepatitis or transfusion reaction.  Specific risks  discussed include neurologic injury with increased paralysis, loss  sensation, increased and/or new pain; epidural hematoma with the need  for return to the operating room; spinal fluid leak with need for  recumbency for three days postoperatively and possible need for return  to the operating room; failure of graft healing; loosening, breakage, or  dislodgement implants; new problems at new levels in the future; the  need to remove the implants at some  future date.      Nelda Severe, MD  Electronically Signed     MT/MEDQ  D:  11/22/2006  T:  11/22/2006  Job:  [id number]

## 2011-02-19 NOTE — Discharge Summary (Signed)
NAME:  Natalie Yoder, Natalie Yoder NO.:  [id number]   MEDICAL RECORD NO.:  [id number]          PATIENT TYPE:  INP   LOCATION:  3011                         FACILITY:  [facility]   PHYSICIAN:  Nelda Severe, MD      DATE OF BIRTH:  1976-06-19   DATE OF ADMISSION:  11/22/2006  DATE OF DISCHARGE:  11/25/2006                               DISCHARGE SUMMARY   This 35 year old woman was admitted for lumbar spine problems causing  back and right leg pain.  The day of admission, she was taken to the  operating room where an L5/S1 disk excision, transforaminal interbody  fusion and posterolateral fusion was carried out.  Postoperatively her  course has been uncomplicated.  Her Foley catheter has been removed and  she is able to void.  She is tolerating a regular diet.  Her pain is  controlled on oral medication.  Her incision is clean and dry.  The  drain was removed today.  She is ambulatory independently with a walker,  and actually without the walker within her room.  Physical therapy has  discharged her.   DISCHARGE DIAGNOSIS:  L5/S1 disk herniation.   DISCUSSION:  She will be followed in the office in approximately 4  weeks.  She has been instructed to call the office for an appointment  for 4 weeks post discharge.  She is to call me for any problems with  wound drainage or persistent fever or any problem at all for that  matter.  She has been instructed to avoid bending and lifting.   She is going to get a 3-in-1 commode, a walker, and hopefully a  reacher/grabber at the time of discharge.  She has been instructed that  she may remove her dressing tomorrow and after that may shower.   She has Vicodin and Flexeril at home which she will take.  Therefore, no  prescriptions are being given to her today.   At the present time, her pain is significantly improved over her  preoperative status.      Nelda Severe, MD  Electronically Signed     MT/MEDQ  D:  11/25/2006  T:   11/25/2006  Job:  [id number]

## 2011-02-19 NOTE — Assessment & Plan Note (Signed)
Lumbar post laminectomy syndrome, chronic trochanteric bursitis.  Her  hip pain is actually better now.  Her complaints include the back pain,  but more so pain all over the body.   Her meds prescribed by this clinic include Darvocet-N 100 two p.o. at  bedtime, she has been out of that, tramadol one p.o. t.i.d., she is not  out of that now, and Voltaren gel, which she is really not using quite  as much since her hip is better.   She also takes the following medications from other physicians including  glimepiride, Actos, metformin, atenolol, gabapentin 400 t.i.d.,  diclofenac 75 b.i.d.  Question if she is taking hydrocodone, she does  have a controlled substance agreement, she will not be taking this.  Cymbalta 90 mg a day, alprazolam 0.25 mg b.i.d., metoclopramide 5 mg  b.i.d., sumatriptan 100 mg b.i.d., topiramate 200 mg a day, and Zanaflex  4 mg at bedtime.   PHYSICAL EXAMINATION:  She has mildly increased tender point sensitivity  in bilateral upper traps, bilateral upper medial scapular border,  bilateral low back, bilateral hip area.   Neck range of motion is full.  Upper extremity strength and range of  motion and sensation are full as is lower extremity.   IMPRESSION:  1. Lumbar post laminectomy syndrome.  2. Probable fibromyalgia syndrome.   PLAN:  1. Continue current medications.  We will need to clarify on the next      visit whether she is still taking the hydrocodone.  We will check      urine drug screen at next visit to help to clarify this as well.  2. Selective PT fitness focus program mainly for her fibromyalgia,      gentle range of motion, stretching, and aerobic activities.   ADDENDUM:  The patient is using a straight cane.  I really do not know  why, she really does not have any diagnosis or physical exam findings  that will make me believe that she needs one.  She indicates that it  does help with her balance.  Consider having some PT for balance  retraining.      Erick Colace, M.D.  Electronically Signed     AEK/MedQ  D:  06/06/2009 13:30:17  T:  06/07/2009 11:08:48  Job #:  [id number]   cc:   Paulene Floor, M.D.   Lianne Cure, P.A.  Fax: [phone]   Dr. Norwood Levo

## 2011-03-31 NOTE — H&P (Signed)
  NAME:  Natalie Yoder, Natalie Yoder                ACCOUNT NO.:  [id number]  MEDICAL RECORD NO.:  [id number]           PATIENT TYPE:  O  LOCATION:  XRAY                         FACILITY:  [facility]  PHYSICIAN:  Nelda Severe, MD      DATE OF BIRTH:  [birth year]  DATE OF ADMISSION:  02/12/2011 DATE OF DISCHARGE:                             HISTORY & PHYSICAL   CHIEF COMPLAINT:  Left shoulder pain, neck pain.  Past medical history includes surgeries of lumbar fusion in 2008, C5-6 fusion in the past 2 years, hypertension, diabetes, fibromyalgia, anxiety, and left shoulder surgery.  Medications to date include lisinopril, Victoza, metformin, Actos, tramadol, Tylenol No. 3, Flexeril, Voltaren, flexor patches.  Drug allergies include ROBAXIN, PENICILLIN, HYPERTENSION, and FENTANYL.  Family history includes diabetes and hypertension.  On review of systems, she reports no fever.  No chills.  No nausea, vomiting, diarrhea.  No chronic cough.  No shortness of breath.  No chest pain.  No melena.  No hemoptysis.  In general, she appears normocephalic.  Pupils equal, round, reactive to light.  Neck is about 50% of active range of motion due to pain and some due to anterior C5-6 fusion.  Chest is clear to auscultation.  No wheezes are noted.  Heart regular rate and rhythm.  No murmurs noted. Abdomen is soft, nontender to palpation.  Positive bowel sounds were auscultated.  On exam, she has decreased range of motion of left shoulder due to pain.  No point tenderness over the central cervical.  MRI shows C4-5 herniated disk to the left with foraminal stenosis.  OPERATIVE PLAN:  C4-5 posterior foraminotomy, diskectomy by Dr. Nelda Severe.     Lianne Cure, P.A.   ______________________________ Nelda Severe, MD    MC/MEDQ  D:  02/12/2011  T:  02/13/2011  Job:  [id number]  Electronically Signed by Lianne Cure P.A. on 03/24/2011 04:36:49 PM Electronically Signed by Nelda Severe MD on 03/31/2011  05:11:51 PM

## 2011-03-31 NOTE — Op Note (Signed)
NAME:  Natalie Yoder, CASEBOLT NO.:  [id number]  MEDICAL RECORD NO.:  [id number]           PATIENT TYPE:  O  LOCATION:  XRAY                         FACILITY:  [facility]  PHYSICIAN:  Nelda Severe, MD      DATE OF BIRTH:  1975-10-13  DATE OF PROCEDURE:  02/16/2011 DATE OF DISCHARGE:  02/12/2011                              OPERATIVE REPORT   SURGEON:  Nelda Severe, MD  ASSISTANT:  Lianne Cure, PA-C.  PREOPERATIVE DIAGNOSES:  Status post C5-T7 anterior cervical fusion, C4 left-sided disk herniation.  POSTOPERATIVE DIAGNOSES:  Status post C5-T7 anterior cervical fusion, C4 left-sided disk herniation.  OPERATIVE PROCEDURE:  Left C4-5 laminoforaminotomy, decompression C5 nerve root.  OPERATIVE NOTE:  The patient was placed under general endotracheal anesthesia.  Foley catheter was placed in the bladder.  Sequential compression devices were placed on both lower extremities.  Vancomycin was infusing intravenously.  A Mayfield tong was attached to the head in the usual fashion.  The patient was then positioned prone on a Jackson frame with a Mayfield head holder attachment.  The arms were positioned at the side in a adducted position and supported on arm boards.  Small amount of hair was clipped from the occipital area.  The posterior cervical area was prepped with DuraPrep and draped in rectangular fashion.  The drapes were secured with Ioban.  A time-out was held during which the usual parameters were discussed/confirmed.  A short vertical incision in the midline was made based on clinical judgment as to where C4-5 was located, based on palpating the prominent spinous process of C2.  The skin was scored initially, the subcutaneous tissue then injected with a mixture of 0.25% plain Marcaine and 1% lidocaine with epinephrine, and then the incision deepened to the level of the ligamentum nuchae using cutting current.  Ligamentum nuchae was divided longitudinally and  then the paraspinal muscles mobilized to the patient's left side only.  The lamina of what proved to be L4 was identified.  It was marked with a skin marker and a tonsil clamp attached to the trailing edge of the lamina.  A cross-table lateral radiograph was taken and it proved to be on a C4 lamina.  This was confirmed by radiologist.  We then proceeded to perform a laminar foraminotomy, first thinning out the bone with a high-speed bur and then using a 2-mm Kerrison to enlarge the area of exposure.  There was a very adherent fibrous layer over the dura which had to be carefully dissected away to get into the dural plane and to identify the take off of the C5 nerve root.  The foramen was decompressed from pedicle above to pedicle below.  Epidural bleeding was controlled bipolar coagulation and small pieces of Gelfoam which were eventually removed at the end of the procedure.  A small fine nerve hook was used to palpate the disk deep to the nerve root.  I was unable to deliver a free fragment of disk.  Unfortunately, the nerve appeared well decompressed.  At this point the pieces of Gelfoam were removed. There was really no significant bleeding.  We  did place a 10-gauge silastic drain in the wound and brought out through the skin to the left side which was secured with a nylon suture.  The paraspinal muscle and ligamentum nuchae was closed using interrupted #1 Vicryl suture, numerous sutures.  Subcutaneous tissue was closed using 2-0 undyed Vicryl in inverted fashion.  Skin was closed using subcuticular 3-0 undyed running Vicryl.  Skin edges were reinforced with Dermabond.  A Mepilex type dressing was applied.  Total blood loss less than 100 mL.  There were no intraoperative complications.  Sponge and needle count was correct.  At the time of dictation, the patient has not yet been awakened, so no neurologic examination is reported here.  Also prior to closing, we did place  approximately 30-40 mg of Depo- Medrol over the nerve root and placed FloSeal substitute over that, filling up the deep part of the wound and closing the wound over the FloSeal.     Nelda Severe, MD     MT/MEDQ  D:  02/16/2011  T:  02/16/2011  Job:  [id number]  Electronically Signed by Nelda Severe MD on 03/31/2011 05:11:53 PM

## 2011-07-22 ENCOUNTER — Other Ambulatory Visit: Payer: Self-pay | Admitting: Orthopedic Surgery

## 2011-07-22 DIAGNOSIS — M545 Low back pain: Secondary | ICD-10-CM

## 2011-07-30 ENCOUNTER — Ambulatory Visit
Admission: RE | Admit: 2011-07-30 | Discharge: 2011-07-30 | Disposition: A | Discharge status: Home / Self Care | Payer: PRIVATE HEALTH INSURANCE | Source: Ambulatory Visit | Attending: Orthopedic Surgery | Admitting: Orthopedic Surgery

## 2011-07-30 DIAGNOSIS — M545 Low back pain: Secondary | ICD-10-CM

## 2013-02-10 ENCOUNTER — Other Ambulatory Visit: Payer: Self-pay | Admitting: Nurse Practitioner

## 2013-02-12 NOTE — Telephone Encounter (Signed)
Last seen  And last A1C 07/31/12

## 2013-02-16 ENCOUNTER — Other Ambulatory Visit (INDEPENDENT_AMBULATORY_CARE_PROVIDER_SITE_OTHER): Payer: Self-pay | Admitting: Otolaryngology

## 2013-02-19 ENCOUNTER — Ambulatory Visit (HOSPITAL_COMMUNITY)
Admission: RE | Admit: 2013-02-19 | Discharge: 2013-02-19 | Disposition: A | Discharge status: Home / Self Care | Payer: PRIVATE HEALTH INSURANCE | Source: Ambulatory Visit | Attending: Otolaryngology | Admitting: Otolaryngology

## 2013-02-19 DIAGNOSIS — H9209 Otalgia, unspecified ear: Secondary | ICD-10-CM | POA: Insufficient documentation

## 2013-07-10 ENCOUNTER — Ambulatory Visit (INDEPENDENT_AMBULATORY_CARE_PROVIDER_SITE_OTHER): Payer: Medicare Other | Admitting: Family Medicine

## 2013-07-10 ENCOUNTER — Encounter: Payer: Self-pay | Admitting: Family Medicine

## 2013-07-10 ENCOUNTER — Ambulatory Visit (INDEPENDENT_AMBULATORY_CARE_PROVIDER_SITE_OTHER): Payer: Medicare Other

## 2013-07-10 VITALS — BP 130/89 | HR 81 | Temp 97.7°F | Resp 20 | Ht 65.0 in | Wt 205.0 lb

## 2013-07-10 DIAGNOSIS — R079 Chest pain, unspecified: Secondary | ICD-10-CM

## 2013-07-10 DIAGNOSIS — M940 Chondrocostal junction syndrome [Tietze]: Secondary | ICD-10-CM

## 2013-07-10 DIAGNOSIS — E119 Type 2 diabetes mellitus without complications: Secondary | ICD-10-CM

## 2013-07-10 LAB — POCT GLYCOSYLATED HEMOGLOBIN (HGB A1C): Hemoglobin A1C: 13.6

## 2013-07-10 MED ORDER — MELOXICAM 15 MG PO TABS: 15.0000 mg | ORAL_TABLET | Freq: Every day | ORAL | Status: DC

## 2013-07-10 MED ORDER — KETOROLAC TROMETHAMINE 60 MG/2ML IM SOLN
30.0000 mg | Freq: Once | INTRAMUSCULAR | Status: AC
  Administered 2013-07-10: 30 mg via INTRAMUSCULAR

## 2013-07-10 NOTE — Progress Notes (Signed)
  Subjective:    Patient ID: Natalie Yoder, female    DOB: [birth year], 37 y.o.   MRN: [id number]  HPI R sided chest pain x 1 day  Patient woke up this morning with persistent right-sided chest pain that has been constant. Pain worse with deep breathing as well as movement. Patient denies any recent strenuous activity. Patient does report that she slept on her right side. No radicular symptoms. Patient is a baseline diabetic that has been fairly poorly controlled. No shortness of breath. No cough. No fevers. Pain is not radiating to the neck or to the jaw. Pain not exertional goal is worse with movement of the arm as well as deep breathing.   Review of Systems  All other systems reviewed and are negative.       Objective:   Physical Exam  Constitutional: She appears well-developed and well-nourished.  HENT:  Head: Normocephalic and atraumatic.  Eyes: Conjunctivae are normal. Pupils are equal, round, and reactive to light.  Neck: Normal range of motion.  Cardiovascular: Normal rate, regular rhythm and normal heart sounds.   Pulmonary/Chest: Effort normal.  Positive market anterior chest wall tenderness palpation predominantly on the right side.  Abdominal: Soft.  Musculoskeletal:  Positive right-sided shoulder pain with resisted external rotation Empty can mildly positive.   Filed Vitals:   07/10/13 1510  BP: 130/89  Pulse: 81  Temp: 97.7 F (36.5 C)  Resp: 20      [facility] reading (PRIMARY) by  Dr. Alvester Morin Pulmonary chest x-ray negative for any focal infiltrate though some mild questionable bronchitic changes                                      Assessment & Plan:  Chest pain - Plan: DG Chest 2 View  Costochondritis - Plan: ketorolac (TORADOL) injection 30 mg, meloxicam (MOBIC) 15 MG tablet  Exam clinically consistent with costochondritis. Given showed a findings there may also be an element of shoulder sprain versus frozen shoulder. Toradol 30 mg IM x1. Will place  on course of Mobic for treatment. Chest pain highly atypical and reproducible with movement and palpation. Cardiac source less likely. Discussed with patient if chest pain centralizes to followup here or in the ER for reevaluation.

## 2013-07-11 ENCOUNTER — Encounter: Payer: Self-pay | Admitting: Family Medicine

## 2013-07-11 ENCOUNTER — Telehealth: Payer: Self-pay | Admitting: *Deleted

## 2013-07-11 ENCOUNTER — Ambulatory Visit (INDEPENDENT_AMBULATORY_CARE_PROVIDER_SITE_OTHER): Payer: Medicare Other | Admitting: Family Medicine

## 2013-07-11 VITALS — BP 114/77 | HR 77 | Temp 98.0°F | Ht 65.0 in | Wt 205.0 lb

## 2013-07-11 DIAGNOSIS — J069 Acute upper respiratory infection, unspecified: Secondary | ICD-10-CM

## 2013-07-11 DIAGNOSIS — E119 Type 2 diabetes mellitus without complications: Secondary | ICD-10-CM

## 2013-07-11 MED ORDER — METFORMIN HCL 1000 MG PO TABS: 1000.0000 mg | ORAL_TABLET | Freq: Two times a day (BID) | ORAL | Status: DC

## 2013-07-11 MED ORDER — INSULIN GLARGINE 100 UNIT/ML SOLOSTAR PEN: 8.0000 [IU] | PEN_INJECTOR | Freq: Every day | SUBCUTANEOUS | Status: DC

## 2013-07-11 MED ORDER — AZITHROMYCIN 250 MG PO TABS: ORAL_TABLET | ORAL | Status: DC

## 2013-07-11 NOTE — Telephone Encounter (Signed)
Patient needs to schedule an appointment with the clinical pharmacist in 1 week to discuss uncontrolled diabetes.   She also needs to return in the next few days for additional labs ordered by Dr. Alvester Morin.

## 2013-07-11 NOTE — Progress Notes (Signed)
  Subjective:    Patient ID: Natalie Yoder, female    DOB: [birth year], 37 y.o.   MRN: [id number]  HPI Patient presents today for diabetes followup. Patient was seen yesterday for costochondritis. Had A1c incidental checked and was noted to be greater than 13. Patient has checked her blood sugars over the past 24 hours with them ranging in the upper 200s to 250s. Patient denies any chest pain, shortness of breath, headache, polyuria, polydipsia. Patient does report having some intermittent blurry vision over the past 2-3 weeks. Patient states she had her eye prescription recently changed. Blurred vision has improved since then. Patient states that she eats and intermittent diet of mainly fruits and vegetables. Patient did report eating some candy corn yesterday prior to her A1c. Patient's diabetic regimen currently includes metformin, victoza, Amaryl, Actos. Patient has been intermittently checking her blood sugars at home.  Patient also reports a mile your eye symptoms including rhinorrhea, nasal congestion, cough. No shortness of breath or wheezing.    Review of Systems  All other systems reviewed and are negative.       Objective:   Physical Exam  Constitutional: She appears well-developed and well-nourished.  HENT:  Head: Normocephalic and atraumatic.  Right Ear: External ear normal.  Left Ear: External ear normal.  +nasal erythema, rhinorrhea bilaterally, + post oropharyngeal erythema    Eyes: Conjunctivae are normal. Pupils are equal, round, and reactive to light.  Neck: Normal range of motion. Neck supple.  Cardiovascular: Normal rate and regular rhythm.   Pulmonary/Chest: Effort normal and breath sounds normal. She has no wheezes. She has no rales.  Musculoskeletal: Normal range of motion.  Neurological: She is alert.  Skin: Skin is warm.   Filed Vitals:   07/11/13 1149  BP: 114/77  Pulse: 77  Temp: 98 F (36.7 C)          Assessment & Plan:  Diabetes - Plan: POCT  glucose (manual entry), Comprehensive metabolic panel, POCT UA - Microalbumin, NMR Lipoprofile with Lipids, metFORMIN (GLUCOPHAGE) 1000 MG tablet, Insulin Glargine (LANTUS SOLOSTAR) 100 UNIT/ML SOPN  URI (upper respiratory infection) - Plan: azithromycin (ZITHROMAX) 250 MG tablet  Diabetes currently very poorly controlled. Will simplify regimen to metformin and long-acting Lantus starting at 8 units daily. We'll check baseline labs including CMP, lipid profile, urine microalbumin. Plan for followup with clinical pharmacist in one week. Instructed patient to check 3 times a day blood sugars. Discussed hypoglycemic red flag.  We'll prescribe Z-Pak for your eye symptoms given concomitant diabetes. Discussed infectious and respiratory red flags. Overall reassuring exam today. Followup as needed.

## 2013-07-17 ENCOUNTER — Ambulatory Visit (INDEPENDENT_AMBULATORY_CARE_PROVIDER_SITE_OTHER): Payer: Medicare Other | Admitting: Pharmacist Clinician (PhC)/ Clinical Pharmacy Specialist

## 2013-07-17 DIAGNOSIS — E1165 Type 2 diabetes mellitus with hyperglycemia: Secondary | ICD-10-CM

## 2013-07-17 MED ORDER — LIRAGLUTIDE 18 MG/3ML ~~LOC~~ SOPN: 1.2000 mg | PEN_INJECTOR | Freq: Every day | SUBCUTANEOUS | Status: DC

## 2013-07-17 NOTE — Progress Notes (Signed)
  Subjective:    Patient ID: Natalie Yoder, female    DOB: 12-15-1975, 37 y.o.   MRN: [id number]  HPI:  Type 2 Diabetes with poor control    Review of Systems  Constitutional: Negative.   HENT: Negative.   Eyes: Negative.   Respiratory: Negative.   Endocrine: Negative.   Allergic/Immunologic: Negative.   Neurological: Negative.   Psychiatric/Behavioral: Negative.        Objective:   Physical Exam  Constitutional: She is oriented to person, place, and time. She appears well-developed and well-nourished.  Cardiovascular: Normal rate and regular rhythm.   Neurological: She is alert and oriented to person, place, and time.  Skin: Skin is warm and dry.  Psychiatric: She has a normal mood and affect. Her behavior is normal. Judgment and thought content normal.          Assessment & Plan:   Diabetes Follow-Up Visit Chief Complaint:  No chief complaint on file.    Exam Regularity:  RRR Edema:  neg  Polyuria:  neg  Polydipsia:  neg Polyphagia:  neg  BMI:  There is no weight on file to calculate BMI.   Weight changes:  Weight gain General Appearance:  alert, oriented, no acute distress and obese Mood/Affect:  normal  HPI:  Type 2 diabetes with poor control  Low fat/carbohydrate diet?  Yes Nicotine Abuse?  No Medication Compliance?  Yes Exercise?  No Alcohol Abuse?  No  Home BG Monitoring:  Checking 2 times a day. Average:  180  High: 300  Low:  95   Lab Results  Component Value Date   HGBA1C 13.6% 07/10/2013    No results found for this basename: MICROALBUR, MALB24HUR    No results found for this basename: CHOL, HDL, LDLCALC, LDLDIRECT, TRIG, CHOLHDL      Assessment: 1.  Diabetes.  Poor Control 2.  Blood Pressure.  At goal 3.  Lipids.  Pending-checked today since she is not on statin therapy 4.  Foot Care.  Daily care reviewed 5.  Dental Care.  annual 6.  Eye Care/Exam.  annual  Recommendations: 1.  Patient is counseled on appropriate foot care. 2.  BP  goal < 130/80. 3.  LDL goal of < 100, HDL > 40 and TG < 150. 4.  Eye Exam yearly and Dental Exam every 6 months. 5.  Dietary recommendations:  1500 cal ADA diet reviewed with patient 6.  Physical Activity recommendations:  20 minutes as tolerated daily 7.  Medication recommendations at this time are as follows:  Add back Victoza 1.2mg  daily.  She had an allergy listed to [place], however patient said she was no allergic to it and had done fine on Victoza but had run out of it. 8.  Return to clinic in 4-6 wks   Time spent counseling patient:  35 min  Physician time spent with patient:  0 Referring provider:  moore   PharmD:  [facility] Pharmacist

## 2013-07-18 LAB — NMR LIPOPROFILE WITH LIPIDS
Cholesterol, Total: 117 mg/dL (ref <200)
HDL Particle Number: 37.5 umol/L (ref >=30.5)
HDL-C: 52 mg/dL (ref >=40)
LDL (calc): 53 mg/dL (ref <100)
LDL Particle Number: 722 nmol/L (ref <1000)
LDL Size: 20.4 nm — ABNORMAL LOW (ref >20.5)
LP-IR Score: 44 (ref <=45)
VLDL Size: 51.2 nm — ABNORMAL HIGH (ref <=46.6)

## 2013-08-10 ENCOUNTER — Ambulatory Visit: Payer: Self-pay | Admitting: Nurse Practitioner

## 2013-10-12 ENCOUNTER — Other Ambulatory Visit: Payer: Self-pay | Admitting: Family Medicine

## 2013-11-21 ENCOUNTER — Ambulatory Visit (INDEPENDENT_AMBULATORY_CARE_PROVIDER_SITE_OTHER): Payer: Medicare Other | Admitting: Family Medicine

## 2013-11-21 ENCOUNTER — Encounter: Payer: Self-pay | Admitting: Family Medicine

## 2013-11-21 ENCOUNTER — Ambulatory Visit (INDEPENDENT_AMBULATORY_CARE_PROVIDER_SITE_OTHER): Payer: Medicare Other

## 2013-11-21 VITALS — BP 133/89 | HR 107 | Temp 101.1°F | Ht 65.0 in | Wt 200.0 lb

## 2013-11-21 DIAGNOSIS — R05 Cough: Secondary | ICD-10-CM

## 2013-11-21 DIAGNOSIS — J101 Influenza due to other identified influenza virus with other respiratory manifestations: Secondary | ICD-10-CM

## 2013-11-21 DIAGNOSIS — R059 Cough, unspecified: Secondary | ICD-10-CM

## 2013-11-21 DIAGNOSIS — J111 Influenza due to unidentified influenza virus with other respiratory manifestations: Secondary | ICD-10-CM

## 2013-11-21 DIAGNOSIS — J069 Acute upper respiratory infection, unspecified: Secondary | ICD-10-CM

## 2013-11-21 LAB — POCT CBC
Granulocyte percent: 60.9 %G (ref 37–80)
HCT, POC: 43.4 % (ref 37.7–47.9)
Hemoglobin: 13.6 g/dL (ref 12.2–16.2)
LYMPH, POC: 1.8 (ref 0.6–3.4)
MCH: 26 pg — AB (ref 27–31.2)
MCHC: 31.3 g/dL — AB (ref 31.8–35.4)
MCV: 83 fL (ref 80–97)
MPV: 8.3 fL (ref 0–99.8)
PLATELET COUNT, POC: 355 10*3/uL (ref 142–424)
POC Granulocyte: 3.3 (ref 2–6.9)
POC LYMPH PERCENT: 34.1 %L (ref 10–50)
RBC: 5.2 M/uL (ref 4.04–5.48)
RDW, POC: 13.3 %
WBC: 5.4 10*3/uL (ref 4.6–10.2)

## 2013-11-21 LAB — POCT INFLUENZA A/B
INFLUENZA B, POC: POSITIVE
Influenza A, POC: NEGATIVE

## 2013-11-21 MED ORDER — AZITHROMYCIN 250 MG PO TABS: ORAL_TABLET | ORAL | Status: DC

## 2013-11-21 MED ORDER — ALBUTEROL SULFATE HFA 108 (90 BASE) MCG/ACT IN AERS: 2.0000 | INHALATION_SPRAY | RESPIRATORY_TRACT | Status: DC | PRN

## 2013-11-21 MED ORDER — OSELTAMIVIR PHOSPHATE 75 MG PO CAPS: 75.0000 mg | ORAL_CAPSULE | Freq: Two times a day (BID) | ORAL | Status: DC

## 2013-11-21 NOTE — Progress Notes (Signed)
   Subjective:    Patient ID: Fonnie MuGeneva Lutze, female    DOB: July 19, 1976, 38 y.o.   MRN: [id number]  HPI URI Symptoms Onset: 3-4 days  Description: body aches, chills, fever, cough,  Modifying factors:  No flu shot this year, poorly controlled diabetic   Symptoms Nasal discharge: yes Fever: yes Sore throat: no Cough: yes Wheezing: faint intermittent  Ear pain: no GI symptoms: no Sick contacts: yes  Red Flags  Stiff neck: no Dyspnea: minimal  Rash: no Swallowing difficulty: no  Sinusitis Risk Factors Headache/face pain: no Double sickening: no tooth pain: no  Allergy Risk Factors Sneezing: no Itchy scratchy throat: no Seasonal symptoms: no  Flu Risk Factors Headache: yes muscle aches: yes severe fatigue: yes     Review of Systems  All other systems reviewed and are negative.       Objective:   Physical Exam  Constitutional: She is oriented to person, place, and time. She appears well-developed and well-nourished.  HENT:  Head: Normocephalic and atraumatic.  Right Ear: External ear normal.  Left Ear: External ear normal.  +nasal erythema, rhinorrhea bilaterally, + post oropharyngeal erythema    Eyes: Pupils are equal, round, and reactive to light.  Neck: Normal range of motion.  Cardiovascular: Normal rate and regular rhythm.   Pulmonary/Chest: Effort normal. She has no wheezes. She has no rales.  Abdominal: Soft. Bowel sounds are normal.  Musculoskeletal: Normal range of motion.  Neurological: She is alert and oriented to person, place, and time.  Skin: Skin is warm.    [facility] reading (PRIMARY) by  Dr. Alvester MorinNewton  CXR preliminarily negative for PNA                                        Assessment & Plan:  Cough - Plan: POCT Influenza A/B, DG Chest 2 View, POCT CBC  URI (upper respiratory infection) - Plan: azithromycin (ZITHROMAX) 250 MG tablet  Influenza B - Plan: oseltamivir (TAMIFLU) 75 MG capsule  Flu B positive Will treat with  tamiflu zpak for atpical coverage in setting of baseline DM Albuterol prn for wheezing  Consider IM vs po steroid if sxs worsen.  CXR preliminarily negative for PNA  Discussed infectious and resp red flags.  Follow up as needed.

## 2013-12-09 ENCOUNTER — Other Ambulatory Visit: Payer: Self-pay | Admitting: Family Medicine

## 2013-12-10 NOTE — Telephone Encounter (Signed)
Last seen 11/21/13  Dr Alvester MorinNewton  Last glucose 07/11/13

## 2014-03-11 ENCOUNTER — Encounter: Payer: Self-pay | Admitting: General Practice

## 2014-03-11 ENCOUNTER — Ambulatory Visit (INDEPENDENT_AMBULATORY_CARE_PROVIDER_SITE_OTHER): Payer: Medicare Other | Admitting: General Practice

## 2014-03-11 VITALS — BP 129/81 | HR 88 | Temp 98.4°F | Ht 65.0 in | Wt 201.4 lb

## 2014-03-11 DIAGNOSIS — L259 Unspecified contact dermatitis, unspecified cause: Secondary | ICD-10-CM

## 2014-03-11 MED ORDER — PREDNISONE (PAK) 10 MG PO TABS: ORAL_TABLET | ORAL | Status: DC

## 2014-03-11 MED ORDER — METHYLPREDNISOLONE ACETATE 80 MG/ML IJ SUSP
80.0000 mg | Freq: Once | INTRAMUSCULAR | Status: AC
  Administered 2014-03-11: 80 mg via INTRAMUSCULAR

## 2014-03-11 MED ORDER — TRIAMCINOLONE ACETONIDE 0.1 % EX CREA: 1.0000 "application " | TOPICAL_CREAM | Freq: Two times a day (BID) | CUTANEOUS | Status: AC

## 2014-03-11 NOTE — Progress Notes (Signed)
   Subjective:    Patient ID: Natalie Yoder, female    DOB: 17-Feb-1976, 38 y.o.   MRN: [id number]  Rash This is a new problem. The current episode started in the past 7 days. The problem has been gradually worsening since onset. The affected locations include the neck, left arm, torso and right arm. The rash is characterized by dryness, itchiness and redness. She was exposed to plant contact. Pertinent negatives include no congestion, cough, diarrhea, facial edema, fatigue, fever, joint pain, shortness of breath or sore throat. Past treatments include moisturizer.      Review of Systems  Constitutional: Negative for fever, chills and fatigue.  HENT: Negative for congestion and sore throat.   Respiratory: Negative for cough, chest tightness and shortness of breath.   Cardiovascular: Negative for chest pain and palpitations.  Gastrointestinal: Negative for diarrhea.  Musculoskeletal: Negative for joint pain.  Skin: Positive for rash.  Neurological: Negative for dizziness, weakness and headaches.       Objective:   Physical Exam  Constitutional: She is oriented to person, place, and time. She appears well-developed and well-nourished.  Cardiovascular: Normal rate, regular rhythm and normal heart sounds.   Pulmonary/Chest: Effort normal and breath sounds normal. No respiratory distress. She exhibits no tenderness.  Neurological: She is alert and oriented to person, place, and time.  Skin: Skin is warm and dry.  Maculopapular erythematous rash noted to anterior neck, bilateral arms, and torso. Negative drainage or warmth.   Psychiatric: She has a normal mood and affect.          Assessment & Plan:  1. Contact dermatitis - methylPREDNISolone acetate (DEPO-MEDROL) injection 80 mg; Inject 1 mL (80 mg total) into the muscle once. - predniSONE (STERAPRED UNI-PAK) 10 MG tablet; Start on 03/12/14  Dispense: 21 tablet; Refill: 0 - triamcinolone cream (KENALOG) 0.1 %; Apply 1 application  topically 2 (two) times daily.  Dispense: 30 g; Refill: 0 -discussed and provided educational material  -avoid irritants -RTO if symptoms worsen or unresolved -Patient verbalized understanding Coralie Keens, FNP-C

## 2014-03-11 NOTE — Patient Instructions (Signed)
Poison Ivy Poison ivy is a inflammation of the skin (contact dermatitis) caused by touching the allergens on the leaves of the ivy plant following previous exposure to the plant. The rash usually appears 48 hours after exposure. The rash is usually bumps (papules) or blisters (vesicles) in a linear pattern. Depending on your own sensitivity, the rash may simply cause redness and itching, or it may also progress to blisters which may break open. These must be well cared for to prevent secondary bacterial (germ) infection, followed by scarring. Keep any open areas dry, clean, dressed, and covered with an antibacterial ointment if needed. The eyes may also get puffy. The puffiness is worst in the morning and gets better as the day progresses. This dermatitis usually heals without scarring, within 2 to 3 weeks without treatment. HOME CARE INSTRUCTIONS  Thoroughly wash with soap and water as soon as you have been exposed to poison ivy. You have about one half hour to remove the plant resin before it will cause the rash. This washing will destroy the oil or antigen on the skin that is causing, or will cause, the rash. Be sure to wash under your fingernails as any plant resin there will continue to spread the rash. Do not rub skin vigorously when washing affected area. Poison ivy cannot spread if no oil from the plant remains on your body. A rash that has progressed to weeping sores will not spread the rash unless you have not washed thoroughly. It is also important to wash any clothes you have been wearing as these may carry active allergens. The rash will return if you wear the unwashed clothing, even several days later. Avoidance of the plant in the future is the best measure. Poison ivy plant can be recognized by the number of leaves. Generally, poison ivy has three leaves with flowering branches on a single stem. Diphenhydramine may be purchased over the counter and used as needed for itching. Do not drive with  this medication if it makes you drowsy.Ask your caregiver about medication for children. SEEK MEDICAL CARE IF:  Open sores develop.  Redness spreads beyond area of rash.  You notice purulent (pus-like) discharge.  You have increased pain.  Other signs of infection develop (such as fever). Document Released: 09/17/2000 Document Revised: 12/13/2011 Document Reviewed: 08/06/2009 Exit[employer] Patient Information 2014 [employer].  

## 2014-05-21 ENCOUNTER — Ambulatory Visit (INDEPENDENT_AMBULATORY_CARE_PROVIDER_SITE_OTHER): Payer: Medicare Other | Admitting: *Deleted

## 2014-05-21 DIAGNOSIS — Z111 Encounter for screening for respiratory tuberculosis: Secondary | ICD-10-CM

## 2014-05-21 NOTE — Patient Instructions (Signed)

## 2014-05-21 NOTE — Progress Notes (Signed)
Patient tolerated well.

## 2014-05-23 LAB — TB SKIN TEST
Induration: 0 mm
TB Skin Test: NEGATIVE

## 2014-07-05 ENCOUNTER — Other Ambulatory Visit: Payer: Self-pay | Admitting: Nurse Practitioner

## 2014-07-05 NOTE — Telephone Encounter (Signed)
Last seen 07/2013, if approved put new ICD 10 code on it

## 2014-07-07 NOTE — Telephone Encounter (Signed)
no more refills without being seen  

## 2014-08-06 ENCOUNTER — Other Ambulatory Visit: Payer: Self-pay | Admitting: Family Medicine

## 2014-08-06 NOTE — Telephone Encounter (Signed)
Last office visit 03-11-14. Last labs 07-17-13. Please advise on refill

## 2014-10-03 ENCOUNTER — Other Ambulatory Visit: Payer: Self-pay | Admitting: Nurse Practitioner

## 2015-02-16 DIAGNOSIS — K047 Periapical abscess without sinus: Secondary | ICD-10-CM | POA: Diagnosis not present

## 2015-03-26 ENCOUNTER — Ambulatory Visit: Payer: Self-pay | Admitting: *Deleted

## 2015-04-24 ENCOUNTER — Other Ambulatory Visit: Payer: Self-pay | Admitting: Obstetrics & Gynecology

## 2015-04-24 DIAGNOSIS — Z01419 Encounter for gynecological examination (general) (routine) without abnormal findings: Secondary | ICD-10-CM | POA: Diagnosis not present

## 2015-04-24 DIAGNOSIS — Z124 Encounter for screening for malignant neoplasm of cervix: Secondary | ICD-10-CM | POA: Diagnosis not present

## 2015-04-24 DIAGNOSIS — Z3480 Encounter for supervision of other normal pregnancy, unspecified trimester: Secondary | ICD-10-CM | POA: Diagnosis not present

## 2015-04-24 DIAGNOSIS — N925 Other specified irregular menstruation: Secondary | ICD-10-CM | POA: Diagnosis not present

## 2015-04-24 DIAGNOSIS — Z1329 Encounter for screening for other suspected endocrine disorder: Secondary | ICD-10-CM | POA: Diagnosis not present

## 2015-04-24 DIAGNOSIS — Z36 Encounter for antenatal screening of mother: Secondary | ICD-10-CM | POA: Diagnosis not present

## 2015-04-24 LAB — OB RESULTS CONSOLE GC/CHLAMYDIA
CHLAMYDIA, DNA PROBE: NEGATIVE
GC PROBE AMP, GENITAL: NEGATIVE

## 2015-04-24 LAB — OB RESULTS CONSOLE GBS: GBS: NEGATIVE

## 2015-04-24 LAB — OB RESULTS CONSOLE RUBELLA ANTIBODY, IGM: RUBELLA: NON-IMMUNE/NOT IMMUNE

## 2015-04-24 LAB — OB RESULTS CONSOLE HEPATITIS B SURFACE ANTIGEN: HEP B S AG: NEGATIVE

## 2015-04-24 LAB — OB RESULTS CONSOLE RPR: RPR: NONREACTIVE

## 2015-04-24 LAB — OB RESULTS CONSOLE HIV ANTIBODY (ROUTINE TESTING): HIV: NONREACTIVE

## 2015-04-25 LAB — CYTOLOGY - PAP

## 2015-04-29 DIAGNOSIS — Z3481 Encounter for supervision of other normal pregnancy, first trimester: Secondary | ICD-10-CM | POA: Diagnosis not present

## 2015-04-29 DIAGNOSIS — Z1379 Encounter for other screening for genetic and chromosomal anomalies: Secondary | ICD-10-CM | POA: Diagnosis not present

## 2015-04-29 DIAGNOSIS — Z31430 Encounter of female for testing for genetic disease carrier status for procreative management: Secondary | ICD-10-CM | POA: Diagnosis not present

## 2015-05-02 ENCOUNTER — Encounter: Payer: Medicare Other | Attending: Obstetrics & Gynecology | Admitting: *Deleted

## 2015-05-02 ENCOUNTER — Encounter: Payer: Self-pay | Admitting: *Deleted

## 2015-05-02 VITALS — Ht 65.0 in | Wt 193.7 lb

## 2015-05-02 DIAGNOSIS — E1165 Type 2 diabetes mellitus with hyperglycemia: Secondary | ICD-10-CM

## 2015-05-02 DIAGNOSIS — Z713 Dietary counseling and surveillance: Secondary | ICD-10-CM | POA: Insufficient documentation

## 2015-05-02 DIAGNOSIS — IMO0002 Reserved for concepts with insufficient information to code with codable children: Secondary | ICD-10-CM

## 2015-05-02 DIAGNOSIS — E118 Type 2 diabetes mellitus with unspecified complications: Secondary | ICD-10-CM | POA: Insufficient documentation

## 2015-05-02 DIAGNOSIS — Z794 Long term (current) use of insulin: Secondary | ICD-10-CM | POA: Diagnosis not present

## 2015-05-02 NOTE — Patient Instructions (Signed)
Plan:  Aim for 3 Carb Choices per meal (45 grams) +/- 1 either way  Aim for 2 Carbs per snack if hungry  Include protein in moderation with your meals and snacks Consider reading food labels for Total Carbohydrate of foods Continue withyour activity level daily as tolerated Continue checking BG at alternate times per day as directed by MD  Continue taking medication Glimiperide as directed by MD

## 2015-05-08 NOTE — Progress Notes (Signed)
Diabetes Self-Management Education  Visit Type: First/Initial  Appt. Start Time: 0730 Appt. End Time: 0900  05/08/2015  Ms. Natalie Yoder, identified by name and date of birth, is a 39 y.o. female with a diagnosis of Diabetes: Type 2 (with pregnancy. ).  Other people present during visit:  Patient   ASSESSMENT  Height 5\' 5"  (1.651 m), weight 193 lb 11.2 oz (87.862 kg). Body mass index is 32.23 kg/(m^2).  Initial Visit Information:  Are you currently following a meal plan?: Yes What type of meal plan do you follow?: no more sweets or sweet beverages Are you taking your medications as prescribed?: Yes Are you checking your feet?: Yes How many days per week are you checking your feet?: 7 How often do you need to have someone help you when you read instructions, pamphlets, or other written materials from your doctor or pharmacy?: 1 - Never What is the last grade level you completed in school?: Associated degree  Psychosocial:     Patient Belief/Attitude about Diabetes: Defeat/Burnout Self-care barriers: Other (comment) (greiving the loss of her Dad last year) Self-management support: Family Other persons present: Patient Patient Concerns: Nutrition/Meal planning Special Needs: None Preferred Learning Style: Auditory, Visual, Hands on Learning Readiness: Ready  Complications:   Last HgB A1C per patient/outside source: 13 % How often do you check your blood sugar?: 3-4 times/day Fasting Blood glucose range (mg/dL): 16-109 Postprandial Blood glucose range (mg/dL): 604-540, 98-119 Number of hypoglycemic episodes per month: 2 Can you tell when your blood sugar is low?: Yes What do you do if your blood sugar is low?: 1 piece of candy Have you had a dilated eye exam in the past 12 months?: No Have you had a dental exam in the past 12 months?: No  Diet Intake:  Breakfast: yogurt OR [place] bacon, egg, occasionally 1 slice bread, 1 cup coffee with creamer and Splenda Snack  (morning): trail mix clusters OR fresh fruit OR apple with PNB OR cubes of cheese Lunch: 6" sub on wheat bread with added veggies, mayo, chips, water Snack (afternoon): trail mix clusters OR fresh fruit OR apple with PNB OR cubes of cheese Dinner: starch, vegetables, lean meat, water Snack (evening): trail mix clusters OR fresh fruit OR apple with PNB OR cubes of cheese Beverage(s): coffee, water  Exercise:  Exercise: ADL's, Light (walking / raking leaves) (walks as able in addition to walkking at work)  Individualized Plan for Diabetes Self-Management Training:   Learning Objective:  Patient will have a greater understanding of diabetes self-management.  Patient education plan per assessed needs and concerns is to attend individual sessions for     Education Topics Reviewed with Patient Today:  Definition of diabetes, type 1 and 2, and the diagnosis of diabetes, Other (comment) (Effects of high BG on baby's weight and development) Role of diet in the treatment of diabetes and the relationship between the three main macronutrients and blood glucose level, Food label reading, portion sizes and measuring food., Carbohydrate counting Helped patient identify appropriate exercises in relation to his/her diabetes, diabetes complications and other health issue. Reviewed patients medication for diabetes, action, purpose, timing of dose and side effects. Identified appropriate SMBG and/or A1C goals.     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 Expect positive outcomes  Education material provided: Living Well with Diabetes, A1C conversion sheet, Meal plan card and Carbohydrate counting sheet  If problems or questions, patient to contact team via:  Phone and Email  Future DSME appointment: 4-6 wks

## 2015-06-06 DIAGNOSIS — Z3480 Encounter for supervision of other normal pregnancy, unspecified trimester: Secondary | ICD-10-CM | POA: Diagnosis not present

## 2015-07-03 DIAGNOSIS — O24919 Unspecified diabetes mellitus in pregnancy, unspecified trimester: Secondary | ICD-10-CM | POA: Diagnosis not present

## 2015-07-03 DIAGNOSIS — Z36 Encounter for antenatal screening of mother: Secondary | ICD-10-CM | POA: Diagnosis not present

## 2015-07-17 DIAGNOSIS — Z23 Encounter for immunization: Secondary | ICD-10-CM | POA: Diagnosis not present

## 2015-07-31 DIAGNOSIS — E119 Type 2 diabetes mellitus without complications: Secondary | ICD-10-CM | POA: Diagnosis not present

## 2015-07-31 DIAGNOSIS — O24112 Pre-existing diabetes mellitus, type 2, in pregnancy, second trimester: Secondary | ICD-10-CM | POA: Diagnosis not present

## 2015-07-31 DIAGNOSIS — O24319 Unspecified pre-existing diabetes mellitus in pregnancy, unspecified trimester: Secondary | ICD-10-CM | POA: Diagnosis not present

## 2015-07-31 DIAGNOSIS — Z3A23 23 weeks gestation of pregnancy: Secondary | ICD-10-CM | POA: Diagnosis not present

## 2015-07-31 DIAGNOSIS — O09522 Supervision of elderly multigravida, second trimester: Secondary | ICD-10-CM | POA: Diagnosis not present

## 2015-09-02 DIAGNOSIS — O4413 Placenta previa with hemorrhage, third trimester: Secondary | ICD-10-CM | POA: Diagnosis not present

## 2015-09-08 ENCOUNTER — Encounter (HOSPITAL_COMMUNITY): Payer: Self-pay | Admitting: Anesthesiology

## 2015-09-08 ENCOUNTER — Inpatient Hospital Stay (HOSPITAL_COMMUNITY)
Admission: AD | Admit: 2015-09-08 | Discharge: 2015-09-12 | DRG: 781 | Disposition: A | Discharge status: Home / Self Care | Payer: Medicare Other | Source: Ambulatory Visit | Attending: Obstetrics and Gynecology | Admitting: Obstetrics and Gynecology

## 2015-09-08 ENCOUNTER — Encounter (HOSPITAL_COMMUNITY): Payer: Self-pay | Admitting: General Practice

## 2015-09-08 DIAGNOSIS — E1310 Other specified diabetes mellitus with ketoacidosis without coma: Secondary | ICD-10-CM | POA: Diagnosis not present

## 2015-09-08 DIAGNOSIS — Z794 Long term (current) use of insulin: Secondary | ICD-10-CM | POA: Diagnosis not present

## 2015-09-08 DIAGNOSIS — O24913 Unspecified diabetes mellitus in pregnancy, third trimester: Secondary | ICD-10-CM | POA: Diagnosis not present

## 2015-09-08 DIAGNOSIS — Z8249 Family history of ischemic heart disease and other diseases of the circulatory system: Secondary | ICD-10-CM

## 2015-09-08 DIAGNOSIS — O09523 Supervision of elderly multigravida, third trimester: Secondary | ICD-10-CM

## 2015-09-08 DIAGNOSIS — O24919 Unspecified diabetes mellitus in pregnancy, unspecified trimester: Secondary | ICD-10-CM | POA: Diagnosis present

## 2015-09-08 DIAGNOSIS — Z36 Encounter for antenatal screening of mother: Secondary | ICD-10-CM | POA: Diagnosis not present

## 2015-09-08 DIAGNOSIS — Z833 Family history of diabetes mellitus: Secondary | ICD-10-CM

## 2015-09-08 DIAGNOSIS — O2343 Unspecified infection of urinary tract in pregnancy, third trimester: Secondary | ICD-10-CM | POA: Diagnosis present

## 2015-09-08 DIAGNOSIS — Z3A29 29 weeks gestation of pregnancy: Secondary | ICD-10-CM

## 2015-09-08 DIAGNOSIS — R0602 Shortness of breath: Secondary | ICD-10-CM | POA: Diagnosis present

## 2015-09-08 DIAGNOSIS — O24112 Pre-existing diabetes mellitus, type 2, in pregnancy, second trimester: Secondary | ICD-10-CM | POA: Diagnosis not present

## 2015-09-08 LAB — CBC
HEMATOCRIT: 44.9 % (ref 36.0–46.0)
HEMOGLOBIN: 15.9 g/dL — AB (ref 12.0–15.0)
MCH: 27.7 pg (ref 26.0–34.0)
MCHC: 35.4 g/dL (ref 30.0–36.0)
MCV: 78.2 fL (ref 78.0–100.0)
Platelets: 388 10*3/uL (ref 150–400)
RBC: 5.74 MIL/uL — ABNORMAL HIGH (ref 3.87–5.11)
RDW: 14.3 % (ref 11.5–15.5)
WBC: 9.8 10*3/uL (ref 4.0–10.5)

## 2015-09-08 LAB — COMPREHENSIVE METABOLIC PANEL
ALT: 24 U/L (ref 14–54)
ANION GAP: 19 — AB (ref 5–15)
AST: 23 U/L (ref 15–41)
Albumin: 3.8 g/dL (ref 3.5–5.0)
Alkaline Phosphatase: 107 U/L (ref 38–126)
BUN: 27 mg/dL — ABNORMAL HIGH (ref 6–20)
CHLORIDE: 97 mmol/L — AB (ref 101–111)
CO2: 11 mmol/L — ABNORMAL LOW (ref 22–32)
CREATININE: 1.27 mg/dL — AB (ref 0.44–1.00)
Calcium: 10.1 mg/dL (ref 8.9–10.3)
GFR, EST NON AFRICAN AMERICAN: 52 mL/min — AB (ref >60)
Glucose, Bld: 418 mg/dL — ABNORMAL HIGH (ref 65–99)
POTASSIUM: 4.7 mmol/L (ref 3.5–5.1)
SODIUM: 127 mmol/L — AB (ref 135–145)
Total Bilirubin: 1.3 mg/dL — ABNORMAL HIGH (ref 0.3–1.2)
Total Protein: 8.2 g/dL — ABNORMAL HIGH (ref 6.5–8.1)

## 2015-09-08 LAB — BASIC METABOLIC PANEL
Anion gap: 17 — ABNORMAL HIGH (ref 5–15)
BUN: 24 mg/dL — AB (ref 6–20)
CALCIUM: 9.5 mg/dL (ref 8.9–10.3)
CHLORIDE: 105 mmol/L (ref 101–111)
CO2: 10 mmol/L — ABNORMAL LOW (ref 22–32)
CREATININE: 1.06 mg/dL — AB (ref 0.44–1.00)
GFR calc non Af Amer: >60 mL/min (ref >60)
Glucose, Bld: 343 mg/dL — ABNORMAL HIGH (ref 65–99)
Potassium: 4.7 mmol/L (ref 3.5–5.1)
SODIUM: 132 mmol/L — AB (ref 135–145)

## 2015-09-08 LAB — URIC ACID: URIC ACID, SERUM: 12.8 mg/dL — AB (ref 2.3–6.6)

## 2015-09-08 LAB — PROTEIN / CREATININE RATIO, URINE
Creatinine, Urine: 71 mg/dL
PROTEIN CREATININE RATIO: 1.23 mg/mg{creat} — AB (ref 0.00–0.15)
Total Protein, Urine: 87 mg/dL

## 2015-09-08 LAB — GLUCOSE, CAPILLARY
GLUCOSE-CAPILLARY: 293 mg/dL — AB (ref 65–99)
GLUCOSE-CAPILLARY: 291 mg/dL — AB (ref 65–99)
GLUCOSE-CAPILLARY: 244 mg/dL — AB (ref 65–99)
Glucose-Capillary: 365 mg/dL — ABNORMAL HIGH (ref 65–99)
Glucose-Capillary: 352 mg/dL — ABNORMAL HIGH (ref 65–99)

## 2015-09-08 LAB — TROPONIN I: Troponin I: <0.03 ng/mL (ref <0.031)

## 2015-09-08 LAB — TSH: TSH: 1.053 u[IU]/mL (ref 0.350–4.500)

## 2015-09-08 LAB — BRAIN NATRIURETIC PEPTIDE: B NATRIURETIC PEPTIDE 5: 13.4 pg/mL (ref 0.0–100.0)

## 2015-09-08 LAB — TYPE AND SCREEN
ABO/RH(D): AB POS
ANTIBODY SCREEN: NEGATIVE

## 2015-09-08 LAB — T4, FREE: FREE T4: 0.77 ng/dL (ref 0.61–1.12)

## 2015-09-08 LAB — LACTATE DEHYDROGENASE: LDH: 177 U/L (ref 98–192)

## 2015-09-08 LAB — LIPASE, BLOOD: LIPASE: 117 U/L — AB (ref 11–51)

## 2015-09-08 LAB — MAGNESIUM: MAGNESIUM: 2.2 mg/dL (ref 1.7–2.4)

## 2015-09-08 MED ORDER — SODIUM CHLORIDE 0.9 % IV SOLN
INTRAVENOUS | Status: DC
  Administered 2015-09-08 – 2015-09-09 (×2): via INTRAVENOUS
  Filled 2015-09-08 (×6): qty 1000

## 2015-09-08 MED ORDER — SODIUM CHLORIDE 0.9 % IV SOLN: INTRAVENOUS | Status: DC

## 2015-09-08 MED ORDER — KCL-LACTATED RINGERS 20 MEQ/L IV SOLN: Freq: Once | INTRAVENOUS | Status: DC

## 2015-09-08 MED ORDER — SODIUM CHLORIDE 0.9 % IV SOLN
INTRAVENOUS | Status: DC
  Administered 2015-09-08: 2.9 [IU]/h via INTRAVENOUS
  Administered 2015-09-08: 6.9 [IU]/h via INTRAVENOUS
  Administered 2015-09-08: 4.7 [IU]/h via INTRAVENOUS
  Administered 2015-09-10: 1.5 [IU]/h via INTRAVENOUS
  Filled 2015-09-08: qty 2.5

## 2015-09-08 MED ORDER — SODIUM CHLORIDE 0.9 % IV SOLN: 1000.0000 mL | Freq: Once | INTRAVENOUS | Status: DC

## 2015-09-08 MED ORDER — PRENATAL MULTIVITAMIN CH
1.0000 | ORAL_TABLET | Freq: Every day | ORAL | Status: DC
  Administered 2015-09-10 – 2015-09-12 (×3): 1 via ORAL
  Filled 2015-09-08 (×3): qty 1

## 2015-09-08 MED ORDER — GI COCKTAIL ~~LOC~~
30.0000 mL | Freq: Once | ORAL | Status: AC
  Administered 2015-09-08: 30 mL via ORAL
  Filled 2015-09-08: qty 30

## 2015-09-08 MED ORDER — LACTATED RINGERS IV SOLN
INTRAVENOUS | Status: DC
  Administered 2015-09-08: 125 mL/h via INTRAVENOUS

## 2015-09-08 MED ORDER — ACETAMINOPHEN 325 MG PO TABS
650.0000 mg | ORAL_TABLET | ORAL | Status: DC | PRN
  Administered 2015-09-10: 650 mg via ORAL
  Filled 2015-09-08: qty 2

## 2015-09-08 MED ORDER — POTASSIUM CHLORIDE IN NACL 40-0.9 MEQ/L-% IV SOLN: INTRAVENOUS | Status: DC

## 2015-09-08 MED ORDER — CALCIUM CARBONATE ANTACID 500 MG PO CHEW
2.0000 | CHEWABLE_TABLET | ORAL | Status: DC | PRN
  Administered 2015-09-08 – 2015-09-10 (×4): 400 mg via ORAL
  Filled 2015-09-08 (×6): qty 2

## 2015-09-08 MED ORDER — DOCUSATE SODIUM 100 MG PO CAPS
100.0000 mg | ORAL_CAPSULE | Freq: Every day | ORAL | Status: DC
Start: 2015-09-09 — End: 2015-09-12
  Administered 2015-09-10 – 2015-09-12 (×3): 100 mg via ORAL
  Filled 2015-09-08 (×3): qty 1

## 2015-09-08 MED ORDER — ZOLPIDEM TARTRATE 5 MG PO TABS
5.0000 mg | ORAL_TABLET | Freq: Every evening | ORAL | Status: DC | PRN
Start: 2015-09-08 — End: 2015-09-12

## 2015-09-08 MED ORDER — LACTATED RINGERS IV BOLUS (SEPSIS)
1000.0000 mL | Freq: Once | INTRAVENOUS | Status: AC
  Administered 2015-09-08: 1000 mL via INTRAVENOUS

## 2015-09-08 MED ORDER — POTASSIUM CHLORIDE 2 MEQ/ML IV SOLN
Freq: Once | INTRAVENOUS | Status: AC
  Administered 2015-09-08: 20:00:00 via INTRAVENOUS
  Filled 2015-09-08: qty 1000

## 2015-09-08 MED ORDER — LACTATED RINGERS IV BOLUS (SEPSIS): 1000.0000 mL | Freq: Once | INTRAVENOUS | Status: DC

## 2015-09-08 MED ORDER — DEXTROSE-NACL 5-0.45 % IV SOLN
INTRAVENOUS | Status: DC
  Administered 2015-09-09 – 2015-09-10 (×2): via INTRAVENOUS
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
## 2015-09-08 NOTE — Progress Notes (Signed)
Attempted to start IV x2 sticks without success, catheter threads without difficulty then blows once IVF's initiated.

## 2015-09-08 NOTE — Anesthesia Preprocedure Evaluation (Deleted)
Anesthesia Evaluation  Patient identified by MRN, date of birth, ID band Patient awake    Reviewed: Allergy & Precautions, NPO status , Patient's Chart, lab work & pertinent test results  Airway Mallampati: I  TM Distance: >3 FB Neck ROM: Full    Dental no notable dental hx. (+) Teeth Intact   Pulmonary neg pulmonary ROS,    Pulmonary exam normal breath sounds clear to auscultation       Cardiovascular hypertension, Pt. on medications  Rhythm:Regular Rate:Tachycardia     Neuro/Psych  Headaches, negative psych ROS   GI/Hepatic negative GI ROS, Neg liver ROS,   Endo/Other  diabetes, Poorly Controlled, Type 2, Insulin Dependent, Oral Hypoglycemic Agents  Renal/GU negative Renal ROS  negative genitourinary   Musculoskeletal Hx/o L5-S1 Spinal fusion   Abdominal (+) + obese,   Peds  Hematology negative hematology ROS (+)   Anesthesia Other Findings   Reproductive/Obstetrics (+) Pregnancy                             Anesthesia Physical Anesthesia Plan  ASA: III  Anesthesia Plan: Spinal and Epidural   Post-op Pain Management:    Induction:   Airway Management Planned: Natural Airway  Additional Equipment:   Intra-op Plan:   Post-operative Plan:   Informed Consent: I have reviewed the patients History and Physical, chart, labs and discussed the procedure including the risks, benefits and alternatives for the proposed anesthesia with the patient or authorized representative who has indicated his/her understanding and acceptance.   Dental advisory given  Plan Discussed with: CRNA, Anesthesiologist and Surgeon  Anesthesia Plan Comments: (Will continue to monitor patient. Discussed anesthesia for Labor as well as C/Section. )        Anesthesia Quick Evaluation

## 2015-09-08 NOTE — Progress Notes (Signed)
Dr. Tenny Crawoss called- order to keep fluid at current bolus rate until next lab results in 2 hours

## 2015-09-09 ENCOUNTER — Inpatient Hospital Stay (HOSPITAL_COMMUNITY): Payer: Medicare Other

## 2015-09-09 DIAGNOSIS — O24919 Unspecified diabetes mellitus in pregnancy, unspecified trimester: Secondary | ICD-10-CM

## 2015-09-09 LAB — URINE MICROSCOPIC-ADD ON

## 2015-09-09 LAB — LIPASE, BLOOD: LIPASE: 55 U/L — AB (ref 11–51)

## 2015-09-09 LAB — BASIC METABOLIC PANEL
Anion gap: 11 (ref 5–15)
Anion gap: 6 (ref 5–15)
BUN: 21 mg/dL — AB (ref 6–20)
BUN: 21 mg/dL — AB (ref 6–20)
CHLORIDE: 110 mmol/L (ref 101–111)
CHLORIDE: 113 mmol/L — AB (ref 101–111)
CO2: 13 mmol/L — AB (ref 22–32)
CO2: 15 mmol/L — AB (ref 22–32)
Calcium: 9.2 mg/dL (ref 8.9–10.3)
Calcium: 9.3 mg/dL (ref 8.9–10.3)
Creatinine, Ser: 0.75 mg/dL (ref 0.44–1.00)
Creatinine, Ser: 0.88 mg/dL (ref 0.44–1.00)
GFR calc Af Amer: >60 mL/min (ref >60)
GFR calc Af Amer: >60 mL/min (ref >60)
GFR calc non Af Amer: >60 mL/min (ref >60)
GFR calc non Af Amer: >60 mL/min (ref >60)
GLUCOSE: 180 mg/dL — AB (ref 65–99)
GLUCOSE: 282 mg/dL — AB (ref 65–99)
POTASSIUM: 4.2 mmol/L (ref 3.5–5.1)
POTASSIUM: 4.3 mmol/L (ref 3.5–5.1)
Sodium: 134 mmol/L — ABNORMAL LOW (ref 135–145)
Sodium: 134 mmol/L — ABNORMAL LOW (ref 135–145)

## 2015-09-09 LAB — GLUCOSE, CAPILLARY
GLUCOSE-CAPILLARY: 102 mg/dL — AB (ref 65–99)
GLUCOSE-CAPILLARY: 115 mg/dL — AB (ref 65–99)
GLUCOSE-CAPILLARY: 115 mg/dL — AB (ref 65–99)
GLUCOSE-CAPILLARY: 118 mg/dL — AB (ref 65–99)
GLUCOSE-CAPILLARY: 119 mg/dL — AB (ref 65–99)
GLUCOSE-CAPILLARY: 124 mg/dL — AB (ref 65–99)
GLUCOSE-CAPILLARY: 130 mg/dL — AB (ref 65–99)
GLUCOSE-CAPILLARY: 132 mg/dL — AB (ref 65–99)
GLUCOSE-CAPILLARY: 138 mg/dL — AB (ref 65–99)
GLUCOSE-CAPILLARY: 172 mg/dL — AB (ref 65–99)
GLUCOSE-CAPILLARY: 210 mg/dL — AB (ref 65–99)
GLUCOSE-CAPILLARY: 98 mg/dL (ref 65–99)
Glucose-Capillary: 104 mg/dL — ABNORMAL HIGH (ref 65–99)
Glucose-Capillary: 105 mg/dL — ABNORMAL HIGH (ref 65–99)
Glucose-Capillary: 107 mg/dL — ABNORMAL HIGH (ref 65–99)
Glucose-Capillary: 108 mg/dL — ABNORMAL HIGH (ref 65–99)
Glucose-Capillary: 123 mg/dL — ABNORMAL HIGH (ref 65–99)
Glucose-Capillary: 132 mg/dL — ABNORMAL HIGH (ref 65–99)
Glucose-Capillary: 136 mg/dL — ABNORMAL HIGH (ref 65–99)
Glucose-Capillary: 138 mg/dL — ABNORMAL HIGH (ref 65–99)
Glucose-Capillary: 219 mg/dL — ABNORMAL HIGH (ref 65–99)
Glucose-Capillary: 83 mg/dL (ref 65–99)
Glucose-Capillary: 88 mg/dL (ref 65–99)
Glucose-Capillary: 95 mg/dL (ref 65–99)

## 2015-09-09 LAB — URINALYSIS, ROUTINE W REFLEX MICROSCOPIC
Glucose, UA: 250 mg/dL — AB
KETONES UR: 40 mg/dL — AB
NITRITE: NEGATIVE
PH: 5.5 (ref 5.0–8.0)
Protein, ur: NEGATIVE mg/dL
SPECIFIC GRAVITY, URINE: 1.025 (ref 1.005–1.030)

## 2015-09-09 LAB — ABO/RH: ABO/RH(D): AB POS

## 2015-09-09 LAB — HEMOGLOBIN A1C
HEMOGLOBIN A1C: 10.5 % — AB (ref 4.8–5.6)
MEAN PLASMA GLUCOSE: 255 mg/dL

## 2015-09-09 LAB — AMYLASE: Amylase: 87 U/L (ref 28–100)

## 2015-09-09 LAB — TROPONIN I
Troponin I: <0.03 ng/mL (ref <0.031)
Troponin I: <0.03 ng/mL (ref <0.031)

## 2015-09-09 MED ORDER — SODIUM CHLORIDE 0.9 % IV SOLN: INTRAVENOUS | Status: DC

## 2015-09-09 MED ORDER — CEFAZOLIN SODIUM-DEXTROSE 2-3 GM-% IV SOLR
2.0000 g | Freq: Three times a day (TID) | INTRAVENOUS | Status: DC
Start: 2015-09-09 — End: 2015-09-10
  Administered 2015-09-09 – 2015-09-10 (×3): 2 g via INTRAVENOUS
  Filled 2015-09-09 (×5): qty 50

## 2015-09-09 MED ORDER — PANTOPRAZOLE SODIUM 40 MG PO TBEC
40.0000 mg | DELAYED_RELEASE_TABLET | Freq: Every day | ORAL | Status: DC
Start: 2015-09-09 — End: 2015-09-12
  Administered 2015-09-09 – 2015-09-12 (×4): 40 mg via ORAL
  Filled 2015-09-09 (×4): qty 1

## 2015-09-09 MED ORDER — INSULIN NPH (HUMAN) (ISOPHANE) 100 UNIT/ML ~~LOC~~ SUSP
13.0000 [IU] | Freq: Every day | SUBCUTANEOUS | Status: DC
  Administered 2015-09-09: 13 [IU] via SUBCUTANEOUS
  Filled 2015-09-09: qty 10

## 2015-09-09 MED ORDER — INSULIN REGULAR HUMAN 100 UNIT/ML IJ SOLN
13.0000 [IU] | Freq: Every day | INTRAMUSCULAR | Status: DC
  Filled 2015-09-09: qty 0.13

## 2015-09-09 MED ORDER — INSULIN REGULAR HUMAN 100 UNIT/ML IJ SOLN
17.0000 [IU] | Freq: Every day | INTRAMUSCULAR | Status: DC
  Filled 2015-09-09: qty 0.17

## 2015-09-09 MED ORDER — INSULIN NPH (HUMAN) (ISOPHANE) 100 UNIT/ML ~~LOC~~ SUSP
35.0000 [IU] | Freq: Every day | SUBCUTANEOUS | Status: DC
  Filled 2015-09-09: qty 10

## 2015-09-09 MED ORDER — PROMETHAZINE HCL 25 MG/ML IJ SOLN
25.0000 mg | Freq: Four times a day (QID) | INTRAMUSCULAR | Status: DC | PRN
  Administered 2015-09-09: 25 mg via INTRAVENOUS
  Filled 2015-09-09: qty 1

## 2015-09-09 NOTE — Progress Notes (Signed)
CSW acknowledges consult.  CSW spoke with patient's RN who states patient is sleeping now after taking Phenergan and will continue to be inpatient tomorrow as well.  CSW will attempt to meet with patient tomorrow to complete assessment.

## 2015-09-09 NOTE — Progress Notes (Addendum)
Inpatient Diabetes Program Recommendations  Diabetes Treatment Program Recommendations  ADA Standards of Care 2016 Diabetes in Pregnancy Target Glucose Ranges:  Fasting: 60 - 90 mg/dL Preprandial: 60 - 105 mg/dL 1 hr postprandial: Less than 138m/dL (from first bite of meal) 2 hr postprandial: Less than 120 mg/dL (from first bit of meal)   Consult received per Dr DCharlies Silversregarding DKA management and glucose control follow-up.  Noted patient's glucose levels high at 29.2 weeks and HgbA1C of 10.5%, at this point in pregnancy potentially with pre-existing DM. Noted patient presently on NPH and Regular in mild ketoacidosis. CO2 still low at 0235 this am. Will need to check another B-met to determine if CO2 has normalized-typically needs the insulin drip to clear acidosis as patient is being managed presently.  Once ready to transition off the drip and the acidosis is completely cleared,  May be helpful to use the Diabetic Pregnant Order set for control based on weight and gestational age using the link on  this protocol/order set; patient would use the NPH/Novolog basal/bolus regimen. Would start with 13 units NPH in the am and 13 units at HS. Her meal coverage would be calculated at 1 unit/6 grams carbohydrate, typically ordered as 30 grams breakfast (5 units novolog) 45-60 grams at lunch and dinner (7-10 units novolog) depending on actual carbohydrate intake.  In order to better calculate the actual basal/bolus needs, the order set provides a correction scale based on 'total daily dose', which for this patient is 78 units tdd. The correction dose is given at the 2 hr pp time (from the first bite of the meal) and before the mid-meal snack. (Spoke with CNA regarding reminder for RN's to add cho coverage to glucostabilizer settings prior to cbg check if patient has just eaten).  The scale is as follows: CBG-mg/dL  Units novolog 60-90    0 units 91-120   3 units 121-160  4 units 161-200  6  units 201-240  9 units 241-280  12 units 281-320  15 units 321-360  18 units 360-400  21 units >400   24 units and call MD  Glad to assist as needed with orders and insulin adjustments for hospital and discharge. Thank you ARosita Kea RN, MSN, CDE  Diabetes Inpatient Program Office: [phone]: [phone]:00 am to 5:00 pm

## 2015-09-09 NOTE — Progress Notes (Signed)
Results for orders placed or performed during the hospital encounter of 09/08/15 (from the past 24 hour(s))  Glucose, capillary     Status: Abnormal   Collection Time: 09/08/15  6:04 PM  Result Value Ref Range   Glucose-Capillary 365 (H) 65 - 99 mg/dL  Urine culture     Status: None (Preliminary result)   Collection Time: 09/08/15  6:10 PM  Result Value Ref Range   Specimen Description URINE, CLEAN CATCH    Special Requests NONE    Culture      NO GROWTH < 12 HOURS Performed at [facility]    Report Status PENDING   Protein / creatinine ratio, urine     Status: Abnormal   Collection Time: 09/08/15  6:10 PM  Result Value Ref Range   Creatinine, Urine 71.00 mg/dL   Total Protein, Urine 87 mg/dL   Protein Creatinine Ratio 1.23 (H) 0.00 - 0.15 mg/mg[Cre]  Comprehensive metabolic panel     Status: Abnormal   Collection Time: 09/08/15  6:15 PM  Result Value Ref Range   Sodium 127 (L) 135 - 145 mmol/L   Potassium 4.7 3.5 - 5.1 mmol/L   Chloride 97 (L) 101 - 111 mmol/L   CO2 11 (L) 22 - 32 mmol/L   Glucose, Bld 418 (H) 65 - 99 mg/dL   BUN 27 (H) 6 - 20 mg/dL   Creatinine, Ser 1.02 (H) 0.44 - 1.00 mg/dL   Calcium 72.5 8.9 - 36.6 mg/dL   Total Protein 8.2 (H) 6.5 - 8.1 g/dL   Albumin 3.8 3.5 - 5.0 g/dL   AST 23 15 - 41 U/L   ALT 24 14 - 54 U/L   Alkaline Phosphatase 107 38 - 126 U/L   Total Bilirubin 1.3 (H) 0.3 - 1.2 mg/dL   GFR calc non Af Amer 52 (L) >60 mL/min   GFR calc Af Amer >60 >60 mL/min   Anion gap 19 (H) 5 - 15  Lipase, blood     Status: Abnormal   Collection Time: 09/08/15  6:15 PM  Result Value Ref Range   Lipase 117 (H) 11 - 51 U/L  Magnesium     Status: None   Collection Time: 09/08/15  6:15 PM  Result Value Ref Range   Magnesium 2.2 1.7 - 2.4 mg/dL  Uric acid     Status: Abnormal   Collection Time: 09/08/15  6:15 PM  Result Value Ref Range   Uric Acid, Serum 12.8 (H) 2.3 - 6.6 mg/dL  Lactate dehydrogenase     Status: None   Collection Time:  09/08/15  6:15 PM  Result Value Ref Range   LDH 177 98 - 192 U/L  T4, free     Status: None   Collection Time: 09/08/15  6:15 PM  Result Value Ref Range   Free T4 0.77 0.61 - 1.12 ng/dL  TSH     Status: None   Collection Time: 09/08/15  6:15 PM  Result Value Ref Range   TSH 1.053 0.350 - 4.500 uIU/mL  Hemoglobin A1c     Status: Abnormal   Collection Time: 09/08/15  6:15 PM  Result Value Ref Range   Hgb A1c MFr Bld 10.5 (H) 4.8 - 5.6 %   Mean Plasma Glucose 255 mg/dL  CBC     Status: Abnormal   Collection Time: 09/08/15  6:15 PM  Result Value Ref Range   WBC 9.8 4.0 - 10.5 K/uL   RBC 5.74 (H) 3.87 - 5.11 MIL/uL  Hemoglobin 15.9 (H) 12.0 - 15.0 g/dL   HCT 16.1 09.6 - 04.5 %   MCV 78.2 78.0 - 100.0 fL   MCH 27.7 26.0 - 34.0 pg   MCHC 35.4 30.0 - 36.0 g/dL   RDW 40.9 81.1 - 91.4 %   Platelets 388 150 - 400 K/uL  Troponin I (q 6hr x 3)     Status: None   Collection Time: 09/08/15  6:15 PM  Result Value Ref Range   Troponin I <0.03 <0.031 ng/mL  Brain natriuretic peptide     Status: None   Collection Time: 09/08/15  6:15 PM  Result Value Ref Range   B Natriuretic Peptide 13.4 0.0 - 100.0 pg/mL  Type and screen [facility] [place]     Status: None   Collection Time: 09/08/15  6:15 PM  Result Value Ref Range   ABO/RH(D) AB POS    Antibody Screen NEG    Sample Expiration 09/11/2015   ABO/Rh     Status: None   Collection Time: 09/08/15  6:15 PM  Result Value Ref Range   ABO/RH(D) AB POS   Glucose, capillary     Status: Abnormal   Collection Time: 09/08/15  8:07 PM  Result Value Ref Range   Glucose-Capillary 352 (H) 65 - 99 mg/dL  Glucose, capillary     Status: Abnormal   Collection Time: 09/08/15  9:10 PM  Result Value Ref Range   Glucose-Capillary 293 (H) 65 - 99 mg/dL  Basic metabolic panel     Status: Abnormal   Collection Time: 09/08/15  9:25 PM  Result Value Ref Range   Sodium 132 (L) 135 - 145 mmol/L   Potassium 4.7 3.5 - 5.1 mmol/L   Chloride 105  101 - 111 mmol/L   CO2 10 (L) 22 - 32 mmol/L   Glucose, Bld 343 (H) 65 - 99 mg/dL   BUN 24 (H) 6 - 20 mg/dL   Creatinine, Ser 7.82 (H) 0.44 - 1.00 mg/dL   Calcium 9.5 8.9 - 95.6 mg/dL   GFR calc non Af Amer >60 >60 mL/min   GFR calc Af Amer >60 >60 mL/min   Anion gap 17 (H) 5 - 15  Glucose, capillary     Status: Abnormal   Collection Time: 09/08/15 10:15 PM  Result Value Ref Range   Glucose-Capillary 291 (H) 65 - 99 mg/dL  Glucose, capillary     Status: Abnormal   Collection Time: 09/08/15 11:17 PM  Result Value Ref Range   Glucose-Capillary 244 (H) 65 - 99 mg/dL  Basic metabolic panel     Status: Abnormal   Collection Time: 09/08/15 11:40 PM  Result Value Ref Range   Sodium 134 (L) 135 - 145 mmol/L   Potassium 4.3 3.5 - 5.1 mmol/L   Chloride 110 101 - 111 mmol/L   CO2 13 (L) 22 - 32 mmol/L   Glucose, Bld 282 (H) 65 - 99 mg/dL   BUN 21 (H) 6 - 20 mg/dL   Creatinine, Ser 2.13 0.44 - 1.00 mg/dL   Calcium 9.3 8.9 - 08.6 mg/dL   GFR calc non Af Amer >60 >60 mL/min   GFR calc Af Amer >60 >60 mL/min   Anion gap 11 5 - 15  Glucose, capillary     Status: Abnormal   Collection Time: 09/09/15 12:19 AM  Result Value Ref Range   Glucose-Capillary 219 (H) 65 - 99 mg/dL  Glucose, capillary     Status: Abnormal   Collection Time:  09/09/15  1:20 AM  Result Value Ref Range   Glucose-Capillary 210 (H) 65 - 99 mg/dL  Glucose, capillary     Status: Abnormal   Collection Time: 09/09/15  2:20 AM  Result Value Ref Range   Glucose-Capillary 172 (H) 65 - 99 mg/dL  Troponin I (q 6hr x 3)     Status: None   Collection Time: 09/09/15  2:35 AM  Result Value Ref Range   Troponin I <0.03 <0.031 ng/mL  Basic metabolic panel     Status: Abnormal   Collection Time: 09/09/15  2:35 AM  Result Value Ref Range   Sodium 134 (L) 135 - 145 mmol/L   Potassium 4.2 3.5 - 5.1 mmol/L   Chloride 113 (H) 101 - 111 mmol/L   CO2 15 (L) 22 - 32 mmol/L   Glucose, Bld 180 (H) 65 - 99 mg/dL   BUN 21 (H) 6 - 20  mg/dL   Creatinine, Ser 9.60 0.44 - 1.00 mg/dL   Calcium 9.2 8.9 - 45.4 mg/dL   GFR calc non Af Amer >60 >60 mL/min   GFR calc Af Amer >60 >60 mL/min   Anion gap 6 5 - 15  Glucose, capillary     Status: Abnormal   Collection Time: 09/09/15  3:24 AM  Result Value Ref Range   Glucose-Capillary 132 (H) 65 - 99 mg/dL  Glucose, capillary     Status: Abnormal   Collection Time: 09/09/15  4:26 AM  Result Value Ref Range   Glucose-Capillary 130 (H) 65 - 99 mg/dL  Glucose, capillary     Status: Abnormal   Collection Time: 09/09/15  5:32 AM  Result Value Ref Range   Glucose-Capillary 107 (H) 65 - 99 mg/dL  Troponin I (q 6hr x 3)     Status: None   Collection Time: 09/09/15  6:02 AM  Result Value Ref Range   Troponin I <0.03 <0.031 ng/mL  Glucose, capillary     Status: Abnormal   Collection Time: 09/09/15  6:33 AM  Result Value Ref Range   Glucose-Capillary 115 (H) 65 - 99 mg/dL  Glucose, capillary     Status: Abnormal   Collection Time: 09/09/15  7:38 AM  Result Value Ref Range   Glucose-Capillary 138 (H) 65 - 99 mg/dL   Comment 1 Notify RN    Comment 2 Document in Chart   Glucose, capillary     Status: Abnormal   Collection Time: 09/09/15  8:38 AM  Result Value Ref Range   Glucose-Capillary 136 (H) 65 - 99 mg/dL   Comment 1 Notify RN    Comment 2 Document in Chart   Lipase, blood     Status: Abnormal   Collection Time: 09/09/15  9:27 AM  Result Value Ref Range   Lipase 55 (H) 11 - 51 U/L  Amylase     Status: None   Collection Time: 09/09/15  9:27 AM  Result Value Ref Range   Amylase 87 28 - 100 U/L  Urinalysis, Routine w reflex microscopic (not at [facility])     Status: Abnormal   Collection Time: 09/09/15  9:30 AM  Result Value Ref Range   Color, Urine YELLOW YELLOW   APPearance CLEAR CLEAR   Specific Gravity, Urine 1.025 1.005 - 1.030   pH 5.5 5.0 - 8.0   Glucose, UA 250 (A) NEGATIVE mg/dL   Hgb urine dipstick TRACE (A) NEGATIVE   Bilirubin Urine SMALL (A) NEGATIVE    Ketones, ur 40 (A) NEGATIVE mg/dL  Protein, ur NEGATIVE NEGATIVE mg/dL   Nitrite NEGATIVE NEGATIVE   Leukocytes, UA TRACE (A) NEGATIVE  Urine microscopic-add on     Status: Abnormal   Collection Time: 09/09/15  9:30 AM  Result Value Ref Range   Squamous Epithelial / LPF 0-5 (A) NONE SEEN   WBC, UA 0-5 0 - 5 WBC/hpf   RBC / HPF 0-5 0 - 5 RBC/hpf   Bacteria, UA RARE (A) NONE SEEN   Crystals URIC ACID CRYSTALS (A) NEGATIVE  Glucose, capillary     Status: Abnormal   Collection Time: 09/09/15  9:34 AM  Result Value Ref Range   Glucose-Capillary 138 (H) 65 - 99 mg/dL   Comment 1 Notify RN    Comment 2 Document in Chart   Glucose, capillary     Status: None   Collection Time: 09/09/15 10:41 AM  Result Value Ref Range   Glucose-Capillary 83 65 - 99 mg/dL   Comment 1 Notify RN    Comment 2 Document in Chart   Glucose, capillary     Status: Abnormal   Collection Time: 09/09/15 11:38 AM  Result Value Ref Range   Glucose-Capillary 108 (H) 65 - 99 mg/dL   Comment 1 Notify RN    Comment 2 Document in Chart   Glucose, capillary     Status: Abnormal   Collection Time: 09/09/15 12:41 PM  Result Value Ref Range   Glucose-Capillary 115 (H) 65 - 99 mg/dL   Comment 1 Notify RN    Comment 2 Document in Chart   Glucose, capillary     Status: Abnormal   Collection Time: 09/09/15  1:35 PM  Result Value Ref Range   Glucose-Capillary 118 (H) 65 - 99 mg/dL   Comment 1 Notify RN    Comment 2 Document in Chart   Glucose, capillary     Status: Abnormal   Collection Time: 09/09/15  2:37 PM  Result Value Ref Range   Glucose-Capillary 119 (H) 65 - 99 mg/dL   Comment 1 Notify RN    Comment 2 Document in Chart   Glucose, capillary     Status: Abnormal   Collection Time: 09/09/15  3:39 PM  Result Value Ref Range   Glucose-Capillary 105 (H) 65 - 99 mg/dL    BPP 6/8- will continue to watch per Dr. Claudean SeveranceWhitecar. UA trace LE- start antibiotics for UTI.  Will give one dose of Ancef and then transfer to PO  when able to take po. Lipase is mormalizing; amylase is normal.

## 2015-09-09 NOTE — Progress Notes (Addendum)
39 y.o. G2P1001 [id number] HD#1 admitted for DKA.  Pt currently stable but vomited small amount of sprite.  Good FM.  Filed Vitals:   09/09/15 0400 09/09/15 0500 09/09/15 0600 09/09/15 0739  BP: 133/73 109/77 125/50 118/67  Pulse: 100 104 90 101  Temp:    98.1 F (36.7 C)  TempSrc:    Oral  Resp: 16 16 16 18   Height:      Weight:      SpO2: 97% 98% 99% 98%    Lungs CTA Cor RRR Abd  Soft, gravid, nontender Ex SCDs FHTs  130s, good short term variability, rare long term accels but not reactive.  Every 30-40 minutes will have a late decel or moderate variable.  No decels in betweeen. Toco  q30-60  Results for orders placed or performed during the hospital encounter of 09/08/15 (from the past 24 hour(s))  Glucose, capillary     Status: Abnormal   Collection Time: 09/08/15  6:04 PM  Result Value Ref Range   Glucose-Capillary 365 (H) 65 - 99 mg/dL  Protein / creatinine ratio, urine     Status: Abnormal   Collection Time: 09/08/15  6:10 PM  Result Value Ref Range   Creatinine, Urine 71.00 mg/dL   Total Protein, Urine 87 mg/dL   Protein Creatinine Ratio 1.23 (H) 0.00 - 0.15 mg/mg[Cre]  Comprehensive metabolic panel     Status: Abnormal   Collection Time: 09/08/15  6:15 PM  Result Value Ref Range   Sodium 127 (L) 135 - 145 mmol/L   Potassium 4.7 3.5 - 5.1 mmol/L   Chloride 97 (L) 101 - 111 mmol/L   CO2 11 (L) 22 - 32 mmol/L   Glucose, Bld 418 (H) 65 - 99 mg/dL   BUN 27 (H) 6 - 20 mg/dL   Creatinine, Ser 2.951.27 (H) 0.44 - 1.00 mg/dL   Calcium 62.110.1 8.9 - 30.810.3 mg/dL   Total Protein 8.2 (H) 6.5 - 8.1 g/dL   Albumin 3.8 3.5 - 5.0 g/dL   AST 23 15 - 41 U/L   ALT 24 14 - 54 U/L   Alkaline Phosphatase 107 38 - 126 U/L   Total Bilirubin 1.3 (H) 0.3 - 1.2 mg/dL   GFR calc non Af Amer 52 (L) >60 mL/min   GFR calc Af Amer >60 >60 mL/min   Anion gap 19 (H) 5 - 15  Lipase, blood     Status: Abnormal   Collection Time: 09/08/15  6:15 PM  Result Value Ref Range   Lipase 117 (H) 11 - 51 U/L   Magnesium     Status: None   Collection Time: 09/08/15  6:15 PM  Result Value Ref Range   Magnesium 2.2 1.7 - 2.4 mg/dL  Uric acid     Status: Abnormal   Collection Time: 09/08/15  6:15 PM  Result Value Ref Range   Uric Acid, Serum 12.8 (H) 2.3 - 6.6 mg/dL  Lactate dehydrogenase     Status: None   Collection Time: 09/08/15  6:15 PM  Result Value Ref Range   LDH 177 98 - 192 U/L  T4, free     Status: None   Collection Time: 09/08/15  6:15 PM  Result Value Ref Range   Free T4 0.77 0.61 - 1.12 ng/dL  TSH     Status: None   Collection Time: 09/08/15  6:15 PM  Result Value Ref Range   TSH 1.053 0.350 - 4.500 uIU/mL  CBC     Status: Abnormal  Collection Time: 09/08/15  6:15 PM  Result Value Ref Range   WBC 9.8 4.0 - 10.5 K/uL   RBC 5.74 (H) 3.87 - 5.11 MIL/uL   Hemoglobin 15.9 (H) 12.0 - 15.0 g/dL   HCT 09.8 11.9 - 14.7 %   MCV 78.2 78.0 - 100.0 fL   MCH 27.7 26.0 - 34.0 pg   MCHC 35.4 30.0 - 36.0 g/dL   RDW 82.9 56.2 - 13.0 %   Platelets 388 150 - 400 K/uL  Troponin I (q 6hr x 3)     Status: None   Collection Time: 09/08/15  6:15 PM  Result Value Ref Range   Troponin I <0.03 <0.031 ng/mL  Brain natriuretic peptide     Status: None   Collection Time: 09/08/15  6:15 PM  Result Value Ref Range   B Natriuretic Peptide 13.4 0.0 - 100.0 pg/mL  Type and screen [facility] [place]     Status: None   Collection Time: 09/08/15  6:15 PM  Result Value Ref Range   ABO/RH(D) AB POS    Antibody Screen NEG    Sample Expiration 09/11/2015   ABO/Rh     Status: None   Collection Time: 09/08/15  6:15 PM  Result Value Ref Range   ABO/RH(D) AB POS   Glucose, capillary     Status: Abnormal   Collection Time: 09/08/15  8:07 PM  Result Value Ref Range   Glucose-Capillary 352 (H) 65 - 99 mg/dL  Glucose, capillary     Status: Abnormal   Collection Time: 09/08/15  9:10 PM  Result Value Ref Range   Glucose-Capillary 293 (H) 65 - 99 mg/dL  Basic metabolic panel     Status:  Abnormal   Collection Time: 09/08/15  9:25 PM  Result Value Ref Range   Sodium 132 (L) 135 - 145 mmol/L   Potassium 4.7 3.5 - 5.1 mmol/L   Chloride 105 101 - 111 mmol/L   CO2 10 (L) 22 - 32 mmol/L   Glucose, Bld 343 (H) 65 - 99 mg/dL   BUN 24 (H) 6 - 20 mg/dL   Creatinine, Ser 8.65 (H) 0.44 - 1.00 mg/dL   Calcium 9.5 8.9 - 78.4 mg/dL   GFR calc non Af Amer >60 >60 mL/min   GFR calc Af Amer >60 >60 mL/min   Anion gap 17 (H) 5 - 15  Glucose, capillary     Status: Abnormal   Collection Time: 09/08/15 10:15 PM  Result Value Ref Range   Glucose-Capillary 291 (H) 65 - 99 mg/dL  Glucose, capillary     Status: Abnormal   Collection Time: 09/08/15 11:17 PM  Result Value Ref Range   Glucose-Capillary 244 (H) 65 - 99 mg/dL  Basic metabolic panel     Status: Abnormal   Collection Time: 09/08/15 11:40 PM  Result Value Ref Range   Sodium 134 (L) 135 - 145 mmol/L   Potassium 4.3 3.5 - 5.1 mmol/L   Chloride 110 101 - 111 mmol/L   CO2 13 (L) 22 - 32 mmol/L   Glucose, Bld 282 (H) 65 - 99 mg/dL   BUN 21 (H) 6 - 20 mg/dL   Creatinine, Ser 6.96 0.44 - 1.00 mg/dL   Calcium 9.3 8.9 - 29.5 mg/dL   GFR calc non Af Amer >60 >60 mL/min   GFR calc Af Amer >60 >60 mL/min   Anion gap 11 5 - 15  Glucose, capillary     Status: Abnormal   Collection Time:  09/09/15 12:19 AM  Result Value Ref Range   Glucose-Capillary 219 (H) 65 - 99 mg/dL  Glucose, capillary     Status: Abnormal   Collection Time: 09/09/15  1:20 AM  Result Value Ref Range   Glucose-Capillary 210 (H) 65 - 99 mg/dL  Glucose, capillary     Status: Abnormal   Collection Time: 09/09/15  2:20 AM  Result Value Ref Range   Glucose-Capillary 172 (H) 65 - 99 mg/dL  Troponin I (q 6hr x 3)     Status: None   Collection Time: 09/09/15  2:35 AM  Result Value Ref Range   Troponin I <0.03 <0.031 ng/mL  Basic metabolic panel     Status: Abnormal   Collection Time: 09/09/15  2:35 AM  Result Value Ref Range   Sodium 134 (L) 135 - 145 mmol/L    Potassium 4.2 3.5 - 5.1 mmol/L   Chloride 113 (H) 101 - 111 mmol/L   CO2 15 (L) 22 - 32 mmol/L   Glucose, Bld 180 (H) 65 - 99 mg/dL   BUN 21 (H) 6 - 20 mg/dL   Creatinine, Ser 4.09 0.44 - 1.00 mg/dL   Calcium 9.2 8.9 - 81.1 mg/dL   GFR calc non Af Amer >60 >60 mL/min   GFR calc Af Amer >60 >60 mL/min   Anion gap 6 5 - 15  Glucose, capillary     Status: Abnormal   Collection Time: 09/09/15  3:24 AM  Result Value Ref Range   Glucose-Capillary 132 (H) 65 - 99 mg/dL  Glucose, capillary     Status: Abnormal   Collection Time: 09/09/15  4:26 AM  Result Value Ref Range   Glucose-Capillary 130 (H) 65 - 99 mg/dL  Glucose, capillary     Status: Abnormal   Collection Time: 09/09/15  5:32 AM  Result Value Ref Range   Glucose-Capillary 107 (H) 65 - 99 mg/dL  Glucose, capillary     Status: Abnormal   Collection Time: 09/09/15  6:33 AM  Result Value Ref Range   Glucose-Capillary 115 (H) 65 - 99 mg/dL  Glucose, capillary     Status: Abnormal   Collection Time: 09/09/15  7:38 AM  Result Value Ref Range   Glucose-Capillary 138 (H) 65 - 99 mg/dL   Comment 1 Notify RN    Comment 2 Document in Chart     A:  HD#1  [redacted]w[redacted]d with insulin dependent BDM and recent recovery from ketoacidosis.  Sugars have been returned to almost normal by glucomander.  Fetal heart tones are still not reassuring (cat 2) but not ominous yet.  Pt still unable to take po without vomiting.   P: 1.  Glucose control is better.  Anion gap is back to normal and CO2 is normalizing but still may be in DKA per Dr. Claudean Severance.  D/w with hospitalist to continue current and have diabetic coordinator help assist with insulin.  Continue Glucomander and change fluids to NS.  Will manage nausea.    2.  TFTS are normal. Will recheck Lipase.  Pt has a WBC of 9.  Will check UA/Ucx in case of UTI.    3.  Expectant management for pregnancy right now.  MFM consult pending- Dr. Claudean Severance feels that lates are due to acidosis.  Will keep on continous  monitoring.       Oluwademilade Kellett A
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
## 2015-09-09 NOTE — Progress Notes (Signed)
Called about diabetic management. Patient presented with DKA. Anion gap now closed but still slight acidosis. Pt may continue current insulin regimen. Order placed for diabetic coordinator to help with adjustment of insulin regimen if needed.  [facility]oder [facility] [phone] or [phone] Please call for questions. Thank you

## 2015-09-09 NOTE — Progress Notes (Signed)
Nutrition  Acknowledgement of Diet Education order Pt sound asleep due to phenergan, left diet education materials at bedside RD's next opportunity for education will be Wednesday afternoon. [facility]   [facility] Stefhanie Yoder M.Odis LusterEd. R.D. LDN Neonatal Nutrition Support Specialist/RD III Pager [phone]      Phone [phone]

## 2015-09-10 ENCOUNTER — Encounter (HOSPITAL_COMMUNITY): Payer: Self-pay | Admitting: General Surgery

## 2015-09-10 LAB — GLUCOSE, CAPILLARY
GLUCOSE-CAPILLARY: 101 mg/dL — AB (ref 65–99)
GLUCOSE-CAPILLARY: 110 mg/dL — AB (ref 65–99)
GLUCOSE-CAPILLARY: 125 mg/dL — AB (ref 65–99)
GLUCOSE-CAPILLARY: 145 mg/dL — AB (ref 65–99)
GLUCOSE-CAPILLARY: 147 mg/dL — AB (ref 65–99)
GLUCOSE-CAPILLARY: 84 mg/dL (ref 65–99)
GLUCOSE-CAPILLARY: 87 mg/dL (ref 65–99)
Glucose-Capillary: 99 mg/dL (ref 65–99)
Glucose-Capillary: 117 mg/dL — ABNORMAL HIGH (ref 65–99)
Glucose-Capillary: 119 mg/dL — ABNORMAL HIGH (ref 65–99)
Glucose-Capillary: 96 mg/dL (ref 65–99)
Glucose-Capillary: 110 mg/dL — ABNORMAL HIGH (ref 65–99)
Glucose-Capillary: 144 mg/dL — ABNORMAL HIGH (ref 65–99)

## 2015-09-10 LAB — BASIC METABOLIC PANEL
ANION GAP: 5 (ref 5–15)
BUN: 15 mg/dL (ref 6–20)
CO2: 20 mmol/L — ABNORMAL LOW (ref 22–32)
Calcium: 9.1 mg/dL (ref 8.9–10.3)
Chloride: 110 mmol/L (ref 101–111)
Creatinine, Ser: 0.9 mg/dL (ref 0.44–1.00)
GFR calc Af Amer: >60 mL/min (ref >60)
Glucose, Bld: 132 mg/dL — ABNORMAL HIGH (ref 65–99)
POTASSIUM: 4.1 mmol/L (ref 3.5–5.1)
SODIUM: 135 mmol/L (ref 135–145)

## 2015-09-10 LAB — CULTURE, OB URINE

## 2015-09-10 LAB — URINE CULTURE

## 2015-09-10 MED ORDER — INSULIN NPH (HUMAN) (ISOPHANE) 100 UNIT/ML ~~LOC~~ SUSP
18.0000 [IU] | Freq: Every day | SUBCUTANEOUS | Status: DC
  Administered 2015-09-10: 18 [IU] via SUBCUTANEOUS

## 2015-09-10 MED ORDER — SODIUM CHLORIDE 0.9 % IJ SOLN
3.0000 mL | Freq: Two times a day (BID) | INTRAMUSCULAR | Status: DC
  Administered 2015-09-10 – 2015-09-11 (×3): 3 mL via INTRAVENOUS

## 2015-09-10 MED ORDER — INSULIN STARTER KIT- SYRINGES (ENGLISH)
1.0000 | Freq: Once | Status: AC
  Administered 2015-09-10: 1
  Filled 2015-09-10: qty 1

## 2015-09-10 MED ORDER — INSULIN ASPART 100 UNIT/ML ~~LOC~~ SOLN
8.0000 [IU] | Freq: Every day | SUBCUTANEOUS | Status: DC
  Administered 2015-09-10: 8 [IU] via SUBCUTANEOUS

## 2015-09-10 MED ORDER — INSULIN ASPART 100 UNIT/ML ~~LOC~~ SOLN
5.0000 [IU] | Freq: Every day | SUBCUTANEOUS | Status: DC
  Administered 2015-09-11 – 2015-09-12 (×2): 5 [IU] via SUBCUTANEOUS

## 2015-09-10 MED ORDER — INSULIN ASPART 100 UNIT/ML ~~LOC~~ SOLN
0.0000 [IU] | Freq: Four times a day (QID) | SUBCUTANEOUS | Status: DC
  Administered 2015-09-10: 4 [IU] via SUBCUTANEOUS
  Administered 2015-09-10: 3 [IU] via SUBCUTANEOUS
  Administered 2015-09-10 – 2015-09-11 (×5): 4 [IU] via SUBCUTANEOUS

## 2015-09-10 NOTE — Progress Notes (Addendum)
Inpatient Diabetes Program Recommendations  Diabetes Treatment Program Recommendations  ADA Standards of Care 2016 Diabetes in Pregnancy Target Glucose Ranges:  Fasting: 60 - 90 mg/dL Preprandial: 60 - 161105 mg/dL 1 hr postprandial: Less than 140mg /dL (from first bite of meal) 2 hr postprandial: Less than 120 mg/dL (from first bite of meal)   Results for Natalie Yoder, Natalie Yoder (MRN [id number]) as of 09/10/2015 08:07  Ref. Range 09/09/2015 23:44 09/10/2015 00:45 09/10/2015 01:43 09/10/2015 02:47 09/10/2015 03:51 09/10/2015 04:40 09/10/2015 05:42 09/10/2015 06:51 09/10/2015 07:49  Glucose-Capillary Latest Ref Range: 65-99 mg/dL 811102 (H) 914101 (H) 99 87 84 117 (H) 147 (H) 145 (H) 119 (H)   Review of Glycemic Control   Current orders for Inpatient glycemic control: Novolin R insulin drip, NPH 35 units QAM, NPH 13 units QHS, Novolog 17 units with breakfast, Novolog 13 units with supper  Inpatient Diabetes Program Recommendations: Insulin - Basal: Please discontinue current NPH orders and consider ordering NPH 18 units BID before breakfast and at bedtime  (starting this morning). Then discontinue insulin drip 1 hour after giving first dose of NPH. Correction (SSI): Please use the Diabetic Pregnant Patient order set and order Novolog 0-24 units QID (fasting and 2 hour post prandial). This correction scale is based on a total daily dose of insulin of 74 units using the weight and gestational age protocol. Insulin - Meal Coverage: Please discontinue current Novolog meal coverage orders and consider ordering Novolog 5 units with breakfast and Novolog 8 units with lunch and supper.  Thanks, Orlando PennerMarie Diar Berkel, RN, MSN, CDE Diabetes Coordinator Inpatient Diabetes Program [phone] (Team Pager from 8am to 5pm) [phone] ([facility] office) [phone] [facility] office) [phone] [facility] office)

## 2015-09-10 NOTE — Clinical Documentation Improvement (Signed)
OB/GYN  Abnormal Lab/Test Results:  Sodium 135 - 145 mmol/L 134 (L) 134 (L) 132 (L) 127 (L)          Possible Clinical Conditions associated with below indicators:   Hyponatremia  Other Condition  Cannot Clinically Determine   Supporting Information: Labs noted above    Treatment Provided:  NS @ 125 ml/hr after 1000ml bolus   Evaluation : BMET  Please exercise your independent, professional judgment when responding. A specific answer is not anticipated or expected.   Thank You,  Lavonda JumboLawanda J Declan Mier Health Information Management [place] [phone]

## 2015-09-10 NOTE — Progress Notes (Signed)
Inpatient Diabetes Program Recommendations  Diabetes Treatment Program Recommendations  ADA Standards of Care 2016 Diabetes in Pregnancy Target Glucose Ranges:  Fasting: 60 - 90 mg/dL Preprandial: 60 - 105 mg/dL 1 hr postprandial: Less than 121m/dL (from first bite of meal) 2 hr postprandial: Less than 120 mg/dL (from first bit of meal)Review of Glycemic Control  Spoke with RN regarding patient teaching how to draw up and administer insulins, NPH and Novolog. Starter kit was ordered with teaching materials. . RN states she will be teaching the patient how to draw up, mix and administer insulin's). Spoke also with patient as to her comfort level using insulin.  She states that she has given injections using a pen at one time using a GLP-1 agent. Patient stated she would prefer using insulin pens, however the NPH does not pesently come in a pen and in addition, she will need to mix her NPH and Novolog in the mornings unless she gives 2 different injections. She must give her NPH no later than 0800 in the am and if to give correction and/or meal coverage, it would be at the same time. Patient stated that she understood.   Will follow glucose levels and glad to assist with dosing adjustments if needed.  Thank you ARosita Kea RN, MSN, CDE  Diabetes Inpatient Program Office: [phone]: [phone]:00 am to 5:00 pm
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
## 2015-09-10 NOTE — Progress Notes (Signed)
Inpatient Diabetes Program Recommendations  Diabetes Treatment Program Recommendations  ADA Standards of Care 2016 Diabetes in Pregnancy Target Glucose Ranges:  Fasting: 60 - 90 mg/dL Preprandial: 60 - 161105 mg/dL 1 hr postprandial: Less than 140mg /dL (from first bite of meal) 2 hr postprandial: Less than 120 mg/dL (from first bit of meal)   Review of Glycemic Control Noted patient to be transitioned off the IV insulin drip. Please give the NPH am dose at least 1 hr prior to discontinuation of the drip, cover breakfast with meal coverage and start correction scale 2 hrs after breakfast. Will follow throughout the day/hospital stay and glad to assist with recommendations throughout until discharge.  Thank you Lenor CoffinAnn Dailyn Reith, RN, MSN, CDE  Diabetes Inpatient Program Office: [phone] Pager: [phone] 8:00 am to 5:00 pm

## 2015-09-10 NOTE — Progress Notes (Signed)
Initial Nutrition Assessment  DOCUMENTATION CODES:   Obesity unspecified  INTERVENTION:  Carbohydrate modified gestational diet Review of GDM diet parameters  NUTRITION DIAGNOSIS:   Limited adherence to nutrition-related recommendations related to social / environmental circumstances as evidenced by other (see comment) (Pt reports of life stress).  GOAL:   Other (Comment) (adherence to GDM diet parameters)  MONITOR:   Labs  REASON FOR ASSESSMENT:   Gestational Diabetes, Consult, Antenatal Diet education  ASSESSMENT:   29 4/7 weeks IUP adm in ketoacidosis. Awake alert and eating today. significant heartburn, but minimal nausea. No weight gain this preg. weight gain goal 11-20 lbs   Diet Order:  Diet gestational carb mod Room service appropriate?: Yes; Fluid consistency:: Thin  Skin:  Reviewed, no issues  Height:   Ht Readings from Last 1 Encounters:  09/08/15 5\' 6"  (1.676 m)    Weight:   Wt Readings from Last 1 Encounters:  09/08/15 191 lb (86.637 kg)    Ideal Body Weight:  59 kg  BMI:  Body mass index is 30.84 kg/(m^2).  Estimated Nutritional Needs:   Kcal:  2000-2200  Protein:  89-99 g  Fluid:  2.3 L  EDUCATION NEEDS:   Education needs addressed. Pt participated in outpt diet education on 05/02/15. Pt has understanding of how to count her CHO's. Acknowledged not following diet plan since mid November.  "Meal  plan for gestational diabetics" handout given to patient.  Basic concepts reviewed. Reviewed timing of meals w/ pt. Reviewed CHO limits per meal and snack.   Questions answered.  Patient verbalizes understanding and demonstrated ability to plan out a meal and snacks   Elisabeth CaraKatherine Rebecah Dangerfield M.Odis LusterEd. R.D. LDN Neonatal Nutrition Support Specialist/RD III Pager [phone]      Phone [phone]

## 2015-09-10 NOTE — Progress Notes (Signed)
39 y.o. G2P1001 4522w4d HD#2 admitted for DKA.  Pt feels much better this AM. .  Nausea has improved--tolerated breakfast this AM. Anion gap has closed and bicarb now 20.    Occ ctx on toco--pt does not feel the ctxs.   Yesterday, fetal monitoring w minimal variability and occ decels.  BPP was 6/8--reviewed by MFM and felt to be secondary to DKA.    Filed Vitals:   09/10/15 0052 09/10/15 0615 09/10/15 0810 09/10/15 0814  BP: 130/78 115/69 115/83   Pulse: 82 76 96 96  Temp:  97.9 F (36.6 C) 98.1 F (36.7 C)   TempSrc:   Oral   Resp:  20 18   Height:      Weight:      SpO2:    98%    NAD Abd  Soft, gravid, nontender Ex SCDs FHTs  140s, moderate variability, currently Cat 1.  Rare decel overnight--variability and tracing much improved since yesterday Toco  Rare ctx   MFM US:  1417g (55%)  A:  HD#2  3622w4d with insulin dependent DM and recent recovery from DKA.  Has been on insulin gtt for >24 hours and BG has normalized.  As anion gap has closed and bicarb now 20, per guidelines, can transition off of insulin gtt.   P: 1.  DKA: Glucose control is better.  Will transition off of insulin gtt.  Recommended regimen per diabetes management: NPH 18/18, Novolog 5/8/8.  Will d/c all fluids at this time as patient is now tolerating PO.  Long discussion w patient regarding insulin regimen/dosing/etc  2.  Possible UTI: on IV antibioitc--UCx is pending    3.  Fetal status has improved as DKA has resolved.  If fetal monitoring continues to be reassuring this AM, will transition to BID NSTs.        [facility] Inc[facility]ANNA GEFFEL [employer] [facility] [employer]CL[facility]

## 2015-09-11 LAB — TYPE AND SCREEN
ABO/RH(D): AB POS
Antibody Screen: NEGATIVE

## 2015-09-11 LAB — GLUCOSE, CAPILLARY
GLUCOSE-CAPILLARY: 154 mg/dL — AB (ref 65–99)
GLUCOSE-CAPILLARY: 160 mg/dL — AB (ref 65–99)
Glucose-Capillary: 140 mg/dL — ABNORMAL HIGH (ref 65–99)
Glucose-Capillary: 134 mg/dL — ABNORMAL HIGH (ref 65–99)

## 2015-09-11 LAB — CULTURE, OB URINE

## 2015-09-11 MED ORDER — INSULIN ASPART 100 UNIT/ML ~~LOC~~ SOLN
10.0000 [IU] | Freq: Every day | SUBCUTANEOUS | Status: DC
  Administered 2015-09-11: 10 [IU] via SUBCUTANEOUS

## 2015-09-11 MED ORDER — INSULIN NPH (HUMAN) (ISOPHANE) 100 UNIT/ML ~~LOC~~ SUSP
20.0000 [IU] | Freq: Every day | SUBCUTANEOUS | Status: DC
  Administered 2015-09-11 – 2015-09-12 (×2): 20 [IU] via SUBCUTANEOUS

## 2015-09-11 MED ORDER — INSULIN NPH (HUMAN) (ISOPHANE) 100 UNIT/ML ~~LOC~~ SUSP
20.0000 [IU] | Freq: Every day | SUBCUTANEOUS | Status: DC
  Administered 2015-09-11: 20 [IU] via SUBCUTANEOUS

## 2015-09-11 NOTE — Care Management Important Message (Signed)
Important Message  Patient Details  Name: Natalie Yoder MRN: [id number] Date of Birth: 02-03-1976   Medicare Important Message Given:  Yes    Renie OraHawkins, Herberta Pickron Smith 09/11/2015, 11:17 AMImportant Message  Patient Details  Name: Natalie Yoder MRN: [id number] Date of Birth: 02-03-1976   Medicare Important Message Given:  Yes    Renie OraHawkins, Hisao Doo Smith 09/11/2015, 11:17 AM

## 2015-09-11 NOTE — Progress Notes (Signed)
Inpatient Diabetes Program Recommendations  Diabetes Treatment Program Recommendations  ADA Standards of Care 2016 Diabetes in Pregnancy Target Glucose Ranges:  Fasting: 60 - 90 mg/dL Preprandial: 60 - 161105 mg/dL 1 hr postprandial: Less than 140mg /dL (from first bite of meal) 2 hr postprandial: Less than 120 mg/dL (from first bite of meal)    Results for Natalie Yoder, Natalie Yoder (MRN [id number]) as of 09/11/2015 07:46  Ref. Range 09/10/2015 08:51 09/10/2015 10:01 09/10/2015 10:51 09/10/2015 14:36 09/10/2015 17:58  Glucose-Capillary Latest Ref Range: 65-99 mg/dL 96 811110 (H) 914110 (H) 782125 (H) 144 (H)   Review of Glycemic Control  Current orders for Inpatient glycemic control: NPH 18 units BID, Novolog 5 units with breakfast, Novolog 8 units with lunch and supper   Inpatient Diabetes Program Recommendations: Insulin - Basal: Please consider increasing NPH to 20 units BID. Insulin - Meal Coverage: Please consider increasing Novolog lunch and supper meal coverage to 10 units with lunch and supper.  Thanks, Orlando PennerMarie Irisa Grimsley, RN, MSN, CDE Diabetes Coordinator Inpatient Diabetes Program [phone] (Team Pager from 8am to 5pm) [phone] ([facility] office) [phone] [facility] office) [phone] [facility] office)

## 2015-09-11 NOTE — Progress Notes (Signed)
Pt has no c/o today. Her BSs are improving but not ideal yet. More changes to insulin made this am. Diabetic counselor will see pt in am.  Urine culture is pending Plan/ Culture should be ready in am          Will hopefully discharge pt in am after counselor talks to pt and make further changes to insulin on outpt basis.

## 2015-09-12 LAB — GLUCOSE, CAPILLARY
Glucose-Capillary: 76 mg/dL (ref 65–99)
Glucose-Capillary: 90 mg/dL (ref 65–99)

## 2015-09-12 MED ORDER — INSULIN ASPART 100 UNIT/ML ~~LOC~~ SOLN: 8.0000 [IU] | Freq: Every day | SUBCUTANEOUS | Status: DC

## 2015-09-12 MED ORDER — PANTOPRAZOLE SODIUM 40 MG PO TBEC: 40.0000 mg | DELAYED_RELEASE_TABLET | Freq: Every day | ORAL | Status: DC

## 2015-09-12 MED ORDER — INSULIN ASPART 100 UNIT/ML ~~LOC~~ SOLN: 12.0000 [IU] | Freq: Every day | SUBCUTANEOUS | Status: DC

## 2015-09-12 MED ORDER — INSULIN NPH (HUMAN) (ISOPHANE) 100 UNIT/ML ~~LOC~~ SUSP: 22.0000 [IU] | Freq: Every day | SUBCUTANEOUS | Status: DC

## 2015-09-12 MED ORDER — INSULIN ASPART 100 UNIT/ML ~~LOC~~ SOLN
12.0000 [IU] | Freq: Every day | SUBCUTANEOUS | Status: DC
  Administered 2015-09-12: 12 [IU] via SUBCUTANEOUS

## 2015-09-12 MED ORDER — INSULIN NPH (HUMAN) (ISOPHANE) 100 UNIT/ML ~~LOC~~ SUSP: 20.0000 [IU] | Freq: Every day | SUBCUTANEOUS | Status: DC

## 2015-09-12 NOTE — Care Management Note (Signed)
Case Management Note  Patient Details  Name: Natalie Yoder MRN: [id number] Date of Birth: 10-07-1975  Subjective/Objective:  39 year old female admitted 09/08/15 with Diabetes                  Action/Plan:D/C when medically stable                    Additional Comments:CM received pc from pt's nurse regarding pt having trouble affording medications.  Pt has 2 types of insurance and is not eligible for medication assistance from the hospital.  Suggestion made for [facility] to [facility] as [facility] are usually the [facility].  [facility] another option.  Kalika Smay RNC-MNN, BSN 09/12/2015, 11:04 AM
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
## 2015-09-12 NOTE — OB Triage Note (Signed)
Pt given discharge instructions and appointment recommendations. Pt had no questions and voiced concerns about getting medications. After speaking with case management nurse advised pt if she has difficulty getting medications through [facility] or has trouble paying for them that she can contact her insurance company and/or contact [facility] office to [facility]. Pt voiced understanding and agreement.

## 2015-09-12 NOTE — Discharge Instructions (Signed)
Diabetic Ketoacidosis Diabetic ketoacidosis is a life-threatening complication of diabetes. If it is not treated, it can cause severe dehydration and organ damage and can lead to a coma or death. CAUSES This condition develops when there is not enough of the hormone insulin in the body. Insulin helps the body to break down sugar for energy. Without insulin, the body cannot break down sugar, so it breaks down fats instead. This leads to the production of acids that are called ketones. Ketones are poisonous at high levels. This condition can be triggered by:  Stress on the body that is brought on by an illness.  Medicines that raise blood glucose levels.  Not taking diabetes medicine. SYMPTOMS Symptoms of this condition include: 1. Fatigue. 2. Weight loss. 3. Excessive thirst. 4. Light-headedness. 5. Fruity or sweet-smelling breath. 6. Excessive urination. 7. Vision changes. 8. Confusion or irritability. 9. Nausea. 10. Vomiting. 11. Rapid breathing. 12. Abdominal pain. 13. Feeling flushed. DIAGNOSIS This condition is diagnosed based on a medical history, a physical exam, and blood tests. You may also have a urine test that checks for ketones. TREATMENT This condition may be treated with: 1. Fluid replacement. This may be done to correct dehydration. 2. Insulin injections. These may be given through the skin or through an IV tube. 3. Electrolyte replacement. Electrolytes, such as potassium and sodium, may be given in pill form or through an IV tube. 4. Antibiotic medicines. These may be prescribed if your condition was caused by an infection. HOME CARE INSTRUCTIONS Eating and Drinking 1. Drink enough fluids to keep your urine clear or pale yellow. 2. If you cannot eat, alternate between drinking fluids with sugar (such as juice) and salty fluids (such as broth or bouillon). 3. If you can eat, follow your usual diet and drink sugar-free liquids, such as water. Other  Instructions  Take insulin as directed by your health care provider. Do not skip insulin injections. Do not use expired insulin.  If your blood sugar is over 240 mg/dL, monitor your urine ketones every 4-6 hours.  If you were prescribed an antibiotic medicine, finish all of it even if you start to feel better.  Rest and exercise only as directed by your health care provider.  If you get sick, call your health care provider and begin treatment quickly. Your body often needs extra insulin to fight an illness.  Check your blood glucose levels regularly. If your blood glucose is high, drink plenty of fluids. This helps to flush out ketones. SEEK MEDICAL CARE IF:  Your blood glucose level is too high or too low.  You have ketones in your urine.  You have a fever.  You cannot eat.  You cannot tolerate fluids.  You have been vomiting for more than 2 hours.  You continue to have symptoms of this condition.  You develop new symptoms. SEEK IMMEDIATE MEDICAL CARE IF:  Your blood glucose levels continue to be high (elevated).  Your monitor reads "high" even when you are taking insulin.  You faint.  You have chest pain.  You have trouble breathing.  You have a sudden, severe headache.  You have sudden weakness in one arm or one leg.  You have sudden trouble speaking or swallowing.  You have vomiting or diarrhea that gets worse after 3 hours.  You feel severely fatigued.  You have trouble thinking.  You have abdominal pain.  You are severely dehydrated. Symptoms of severe dehydration include:  Extreme thirst.  Dry mouth.  Blue lips.  Cold hands and feet.  Rapid breathing.   This information is not intended to replace advice given to you by your health care provider. Make sure you discuss any questions you have with your health care provider.   Document Released: 09/17/2000 Document Revised: 02/04/2015 Document Reviewed: 08/28/2014 Elsevier Interactive Patient  Education 2016 [employer]  How and Where to Give Subcutaneous Insulin Injections, Adult People with type 1 diabetes must take insulin since their bodies do not make it. People with type 2 diabetes may require insulin. There are many different types of insulin as well as other injectable diabetes medicines that are meant to be injected into the fat layer under your skin. The type of insulin or injectable diabetes medicine you take may determine how many injections you give yourself and when to take the injections.  CHOOSING A SITE FOR INJECTION Insulin absorption varies from site to site. As with any injectable medication it is best for the insulin to be injected within the same body region. However, do not inject the insulin in the same spot each time. Rotating the spots you give your injections will prevent inflammation or tissue breakdown. There are four main regions that can be used for injections. The regions include the:  Abdomen (preferred region, especially for non-insulin injectable diabetes medicine).  Front and upper outer sides of thighs.  Back of upper arm.  Buttocks. USING A SYRINGE AND VIAL Drawing up insulin: single insulin dose 14. Wash your hands with soap and water. 15. Gently roll the insulin bottle (vial) between your hands to mix it. Do not shake the vial. 16. Clean the top rubber part of the vial with an alcohol wipe. Be sure that the plastic pop-top has been removed on newer vials. 17. Remove the plastic cover from the needle on the syringe. Do not let the needle touch anything. 18. Pull the plunger back to draw air into the syringe. The air should be the same amount as the insulin dose. 19. Push the needle through the rubber on the top of the vial. Do not turn the vial over. 20. Push the plunger in all the way to put the air into the vial. 21. Leave the needle in the vial and turn the vial and syringe upside down. 22. Pull down slowly on the plunger, drawing the  amount of insulin you need into the syringe. 23. Look for air bubbles in the syringe. You may need to push the plunger up and down 2 to 3 times to slowly get rid of any air bubbles in the syringe. 24. Pull back the plunger to get your correct dose. 25. Remove the needle from the vial. 26. Use an alcohol wipe to clean the area of the body to be injected. 27. Pinch up 1 inch of skin and hold it. 28. Put the needle straight into the skin (90-degree angle). Put the needle in as far as it will go (to the hub). The needle may need to be injected at a 45-degree angle in small adults with little fat. 29. When the needle is in, you can let go of your skin. 30. Push the plunger down all the way to inject the insulin. 31. Pull the needle straight out of the skin. 32. Press the alcohol wipe over the spot where you gave your injection. Keep it there for a few seconds. Do not rub the area. 33. Do not put the plastic cover back on the needle. Drawing up insulin: mixing 2 insulins 5. Wash  your hands with soap and water. 6. Gently roll the vial of "cloudy" insulin between your hands or rotate the vial from top to bottom to mix. 7. Clean the top of both vials with an alcohol wipe. Be sure that the plastic pop-top lid has been removed on newer vials. 8. Pull air into the syringe to equal the dose of "cloudy" insulin. 9. Stick the needle into the "cloudy" insulin vial and inject the air. Be sure to keep the vial upright. 10. Remove the needle from the "cloudy" insulin vial. 11. Pull air into the syringe to equal the dose of "clear" insulin. 12. Stick the needle into the "clear" insulin vial and inject the air. 13. Leave the needle in the "clear" insulin vial and turn the vial upside down. 14. Pull down on the plunger and slowly draw into the syringe the number of units of "clear" insulin desired. 15. Look for air bubbles in the syringe. You may need to push the plunger up and down 2 to 3 times to slowly get rid  of any air bubbles in the syringe. 16. Remove the needle from the "clear" insulin vial. 17. Stick the needle into the "cloudy" insulin vial. Do not inject any of the "clear" insulin into the "cloudy" vial. 18. Turn the "cloudy" vial upside down and pull the plunger down to the number of units that equals the total number of units of "clear" and "cloudy" insulins. 19. Remove the needle from the "cloudy" insulin vial. 20. Use an alcohol wipe to clean the area of the body to be injected. 21. Put the needle straight into the skin (90-degree angle). Put the needle in as far as it will go (to the hub). The needle may need to be injected at a 45-degree angle in small adults with little fat. 22. When the needle is in, you can let go of your skin. 23. Push the plunger down all the way to inject the insulin. 24. Pull the needle straight out of the skin. 25. Press the alcohol wipe over the spot where you gave your injection. Keep it there for a few seconds. Do not rub the area. 26. Do not put the plastic cover back on the needle. USING INSULIN PENS 4. Wash your hands with soap and water. 5. If you are using the "cloudy" insulin, roll the pen between your palms several times or rotate the pen top to bottom several times. 6. Remove the insulin pen cap. 7. Clean the rubber stopper of the cartridge with an alcohol wipe. 8. Remove the protective paper tab from the disposable needle. 9. Screw the needle onto the pen. 10. Remove the outer plastic needle cover. 11. Remove the inner plastic needle cover. 12. Prime the insulin pen by turning the button (dial) to 2 units. Hold the pen with the needle pointing up, and push the dial on the opposite end until a drop of insulin appears at the needle tip. If no insulin appears, repeat this step. 13. Dial the number of units of insulin you will inject. 14. Use an alcohol wipe to clean the area of the body to be injected. 15. Pinch up 1 inch of skin and hold it. 16. Put  the needle straight into the skin (90-degree angle). 17. Push the dial down to push the insulin into the fat tissue. 18. Count to 10 slowly. Then, remove the needle from the fat tissue. 19. Carefully replace the larger outer plastic needle cover over the needle and unscrew the  capped needle. THROWING AWAY SUPPLIES  Discard used needles in a puncture proof sharps disposal container. Follow disposal regulations for the area where you live.  Vials and empty disposable pens may be thrown away in the regular trash.   This information is not intended to replace advice given to you by your health care provider. Make sure you discuss any questions you have with your health care provider.   Document Released: 12/11/2003 Document Revised: 10/11/2014 Document Reviewed: 02/27/2013 Elsevier Interactive Patient Education [employer]

## 2015-09-12 NOTE — Progress Notes (Signed)
Pt feeling well, no complaints, good FM and no other issues. Reviewed insulin mgmt, slight increase today; [22] 05-16-11- [20] Pt has lancets and test strips, will order insulin and protonix to pharmacy Urine culture negative Pt to f/u 1 week for review of sugar control, earlier with any problems.

## 2015-09-30 DIAGNOSIS — O24419 Gestational diabetes mellitus in pregnancy, unspecified control: Secondary | ICD-10-CM | POA: Diagnosis not present

## 2015-10-05 NOTE — L&D Delivery Note (Signed)
FHTs with two lates and then bradycardia to 90s for 8 minutes.  Pt checked and was complete.  FHTs returned to baseline before I arrived.  Patient was C/C/+3 and pushed for 2 minutes with epidural.   NSVD  female infant, Apgars 9,9, weight P.   The patient had a second degree midline perineal laceration repaired with 2-0 vicryl R. Fundus was firm. EBL was expected amount. Placenta was delivered intact. Vagina was clear.  Baby was vigorous and doing skin to skin with mother.  Marietta Sikkema A

## 2015-10-08 DIAGNOSIS — O24113 Pre-existing diabetes mellitus, type 2, in pregnancy, third trimester: Secondary | ICD-10-CM | POA: Diagnosis not present

## 2015-10-10 DIAGNOSIS — O24113 Pre-existing diabetes mellitus, type 2, in pregnancy, third trimester: Secondary | ICD-10-CM | POA: Diagnosis not present

## 2015-10-13 DIAGNOSIS — O24113 Pre-existing diabetes mellitus, type 2, in pregnancy, third trimester: Secondary | ICD-10-CM | POA: Diagnosis not present

## 2015-10-14 ENCOUNTER — Other Ambulatory Visit: Payer: Self-pay | Admitting: Nurse Practitioner

## 2015-10-14 DIAGNOSIS — IMO0002 Reserved for concepts with insufficient information to code with codable children: Secondary | ICD-10-CM

## 2015-10-16 DIAGNOSIS — O24113 Pre-existing diabetes mellitus, type 2, in pregnancy, third trimester: Secondary | ICD-10-CM | POA: Diagnosis not present

## 2015-10-22 ENCOUNTER — Other Ambulatory Visit: Payer: Self-pay | Admitting: Obstetrics and Gynecology

## 2015-10-22 DIAGNOSIS — Z36 Encounter for antenatal screening of mother: Secondary | ICD-10-CM | POA: Diagnosis not present

## 2015-10-24 ENCOUNTER — Encounter (HOSPITAL_COMMUNITY): Payer: Self-pay

## 2015-10-24 ENCOUNTER — Ambulatory Visit (HOSPITAL_COMMUNITY): Payer: Medicare Other

## 2015-10-28 ENCOUNTER — Inpatient Hospital Stay (HOSPITAL_COMMUNITY): Payer: Medicare Other | Admitting: Anesthesiology

## 2015-10-28 ENCOUNTER — Encounter (HOSPITAL_COMMUNITY): Payer: Self-pay

## 2015-10-28 ENCOUNTER — Other Ambulatory Visit (HOSPITAL_COMMUNITY): Payer: Self-pay | Admitting: Obstetrics and Gynecology

## 2015-10-28 ENCOUNTER — Ambulatory Visit (HOSPITAL_COMMUNITY)
Admission: RE | Admit: 2015-10-28 | Discharge: 2015-10-28 | Disposition: A | Discharge status: Home / Self Care | Payer: Medicare Other | Source: Ambulatory Visit | Attending: Nurse Practitioner | Admitting: Nurse Practitioner

## 2015-10-28 ENCOUNTER — Inpatient Hospital Stay (HOSPITAL_COMMUNITY)
Admission: AD | Admit: 2015-10-28 | Discharge: 2015-11-01 | DRG: 774 | Disposition: A | Discharge status: Home / Self Care | Payer: Medicare Other | Source: Ambulatory Visit | Attending: Obstetrics and Gynecology | Admitting: Obstetrics and Gynecology

## 2015-10-28 ENCOUNTER — Ambulatory Visit (HOSPITAL_COMMUNITY)
Admission: RE | Admit: 2015-10-28 | Discharge: 2015-10-28 | Disposition: A | Discharge status: Home / Self Care | Payer: Medicare Other | Source: Ambulatory Visit | Attending: Obstetrics and Gynecology | Admitting: Obstetrics and Gynecology

## 2015-10-28 ENCOUNTER — Other Ambulatory Visit: Payer: Self-pay | Admitting: Nurse Practitioner

## 2015-10-28 DIAGNOSIS — IMO0002 Reserved for concepts with insufficient information to code with codable children: Secondary | ICD-10-CM

## 2015-10-28 DIAGNOSIS — Z3A36 36 weeks gestation of pregnancy: Secondary | ICD-10-CM

## 2015-10-28 DIAGNOSIS — O24313 Unspecified pre-existing diabetes mellitus in pregnancy, third trimester: Secondary | ICD-10-CM

## 2015-10-28 DIAGNOSIS — O4103X0 Oligohydramnios, third trimester, not applicable or unspecified: Secondary | ICD-10-CM

## 2015-10-28 DIAGNOSIS — O365930 Maternal care for other known or suspected poor fetal growth, third trimester, not applicable or unspecified: Principal | ICD-10-CM | POA: Diagnosis present

## 2015-10-28 DIAGNOSIS — O43813 Placental infarction, third trimester: Secondary | ICD-10-CM | POA: Diagnosis not present

## 2015-10-28 DIAGNOSIS — K219 Gastro-esophageal reflux disease without esophagitis: Secondary | ICD-10-CM | POA: Diagnosis present

## 2015-10-28 DIAGNOSIS — O163 Unspecified maternal hypertension, third trimester: Secondary | ICD-10-CM

## 2015-10-28 DIAGNOSIS — O24913 Unspecified diabetes mellitus in pregnancy, third trimester: Secondary | ICD-10-CM

## 2015-10-28 DIAGNOSIS — O09523 Supervision of elderly multigravida, third trimester: Secondary | ICD-10-CM

## 2015-10-28 DIAGNOSIS — Z794 Long term (current) use of insulin: Secondary | ICD-10-CM | POA: Diagnosis not present

## 2015-10-28 DIAGNOSIS — O4100X0 Oligohydramnios, unspecified trimester, not applicable or unspecified: Secondary | ICD-10-CM | POA: Diagnosis not present

## 2015-10-28 DIAGNOSIS — Z0489 Encounter for examination and observation for other specified reasons: Secondary | ICD-10-CM

## 2015-10-28 DIAGNOSIS — O09529 Supervision of elderly multigravida, unspecified trimester: Secondary | ICD-10-CM | POA: Diagnosis not present

## 2015-10-28 DIAGNOSIS — E1165 Type 2 diabetes mellitus with hyperglycemia: Secondary | ICD-10-CM | POA: Diagnosis present

## 2015-10-28 DIAGNOSIS — O2432 Unspecified pre-existing diabetes mellitus in childbirth: Secondary | ICD-10-CM | POA: Diagnosis present

## 2015-10-28 DIAGNOSIS — O9962 Diseases of the digestive system complicating childbirth: Secondary | ICD-10-CM | POA: Diagnosis not present

## 2015-10-28 DIAGNOSIS — Z3490 Encounter for supervision of normal pregnancy, unspecified, unspecified trimester: Secondary | ICD-10-CM

## 2015-10-28 DIAGNOSIS — O1493 Unspecified pre-eclampsia, third trimester: Secondary | ICD-10-CM | POA: Diagnosis not present

## 2015-10-28 DIAGNOSIS — Z36 Encounter for antenatal screening of mother: Secondary | ICD-10-CM

## 2015-10-28 HISTORY — DX: Personal history of other diseases of the digestive system: Z87.19

## 2015-10-28 LAB — CBC
HCT: 37.4 % (ref 36.0–46.0)
Hemoglobin: 12.8 g/dL (ref 12.0–15.0)
MCH: 27.8 pg (ref 26.0–34.0)
MCHC: 34.2 g/dL (ref 30.0–36.0)
MCV: 81.1 fL (ref 78.0–100.0)
Platelets: 379 10*3/uL (ref 150–400)
RBC: 4.61 MIL/uL (ref 3.87–5.11)
RDW: 14.9 % (ref 11.5–15.5)
WBC: 9.4 10*3/uL (ref 4.0–10.5)

## 2015-10-28 LAB — GLUCOSE, CAPILLARY
Glucose-Capillary: 68 mg/dL (ref 65–99)
Glucose-Capillary: 65 mg/dL (ref 65–99)
Glucose-Capillary: 75 mg/dL (ref 65–99)
Glucose-Capillary: 115 mg/dL — ABNORMAL HIGH (ref 65–99)
Glucose-Capillary: 104 mg/dL — ABNORMAL HIGH (ref 65–99)
Glucose-Capillary: 100 mg/dL — ABNORMAL HIGH (ref 65–99)

## 2015-10-28 LAB — TYPE AND SCREEN
ABO/RH(D): AB POS
ANTIBODY SCREEN: NEGATIVE

## 2015-10-28 MED ORDER — ONDANSETRON HCL 4 MG/2ML IJ SOLN: 4.0000 mg | Freq: Four times a day (QID) | INTRAMUSCULAR | Status: DC | PRN

## 2015-10-28 MED ORDER — ACETAMINOPHEN 325 MG PO TABS
650.0000 mg | ORAL_TABLET | ORAL | Status: DC | PRN
Start: 2015-10-28 — End: 2015-10-29
  Administered 2015-10-28: 650 mg via ORAL
  Filled 2015-10-28: qty 2

## 2015-10-28 MED ORDER — OXYTOCIN BOLUS FROM INFUSION: 500.0000 mL | INTRAVENOUS | Status: DC

## 2015-10-28 MED ORDER — SODIUM CHLORIDE 0.9 % IV SOLN
INTRAVENOUS | Status: DC
  Filled 2015-10-28: qty 2.5

## 2015-10-28 MED ORDER — TERBUTALINE SULFATE 1 MG/ML IJ SOLN
0.2500 mg | Freq: Once | INTRAMUSCULAR | Status: DC | PRN
  Filled 2015-10-28: qty 1

## 2015-10-28 MED ORDER — OXYTOCIN 10 UNIT/ML IJ SOLN
1.0000 m[IU]/min | INTRAMUSCULAR | Status: DC
  Administered 2015-10-28: 2 m[IU]/min via INTRAVENOUS
  Filled 2015-10-28: qty 4

## 2015-10-28 MED ORDER — LACTATED RINGERS IV SOLN
500.0000 mL | INTRAVENOUS | Status: DC | PRN
  Administered 2015-10-28: 500 mL via INTRAVENOUS

## 2015-10-28 MED ORDER — DEXTROSE IN LACTATED RINGERS 5 % IV SOLN
INTRAVENOUS | Status: DC
  Administered 2015-10-28: 17:00:00 via INTRAVENOUS

## 2015-10-28 MED ORDER — DIPHENHYDRAMINE HCL 50 MG/ML IJ SOLN: 12.5000 mg | INTRAMUSCULAR | Status: DC | PRN

## 2015-10-28 MED ORDER — OXYTOCIN 10 UNIT/ML IJ SOLN
2.5000 [IU]/h | INTRAVENOUS | Status: DC
  Administered 2015-10-29: 500 m[IU]/h via INTRAVENOUS

## 2015-10-28 MED ORDER — EPHEDRINE 5 MG/ML INJ
10.0000 mg | INTRAVENOUS | Status: DC | PRN
  Filled 2015-10-28: qty 2

## 2015-10-28 MED ORDER — FENTANYL 2.5 MCG/ML BUPIVACAINE 1/10 % EPIDURAL INFUSION (WH - ANES)
14.0000 mL/h | INTRAMUSCULAR | Status: DC | PRN
  Administered 2015-10-29: 14 mL/h via EPIDURAL
  Filled 2015-10-28: qty 125

## 2015-10-28 MED ORDER — PHENYLEPHRINE 40 MCG/ML (10ML) SYRINGE FOR IV PUSH (FOR BLOOD PRESSURE SUPPORT)
80.0000 ug | PREFILLED_SYRINGE | INTRAVENOUS | Status: DC | PRN
  Filled 2015-10-28: qty 20
  Filled 2015-10-28: qty 2

## 2015-10-28 MED ORDER — CITRIC ACID-SODIUM CITRATE 334-500 MG/5ML PO SOLN: 30.0000 mL | ORAL | Status: DC | PRN

## 2015-10-28 MED ORDER — LACTATED RINGERS IV SOLN
INTRAVENOUS | Status: DC
  Administered 2015-10-28: 23:00:00 via INTRAVENOUS

## 2015-10-28 MED ORDER — MISOPROSTOL 25 MCG QUARTER TABLET
25.0000 ug | ORAL_TABLET | ORAL | Status: DC | PRN
  Administered 2015-10-28: 25 ug via VAGINAL
  Filled 2015-10-28: qty 0.25
  Filled 2015-10-28: qty 1

## 2015-10-28 MED ORDER — LIDOCAINE HCL (PF) 1 % IJ SOLN
30.0000 mL | INTRAMUSCULAR | Status: DC | PRN
  Filled 2015-10-28: qty 30

## 2015-10-28 NOTE — Progress Notes (Signed)
MFM Consult, Staff Note:  Impressions: SIUP at [redacted]w[redacted]d EFW <10th UA Dopplers are normal A few loops of prominent small and large bowel are seen but are not technically dilated as caliber is less than 10mm placenta has a number of internal echogenicities, likely calcifications AFI is consistent with oligohydramnios BPP 6/10 (-2 no breathing, nonreactive NST) Fasting glycemic measures:  59-108;  2 hour postprandial measures:  60-120.    Discussion:  I spoke to your patient regarding her pregnancy with IUGR complicated now by oligohydramnios and poorly controlled DM.  Given that BPP 6/10 and that is is >36 weeks she no longer meets criteria for expectant management of uncomplicated IUGR.  She requires delivery.  Recommendations: 1. CEFM  --> move towards delivery  2. Insulin gtt intrapartum 3. Would restart insulin regimen at 40% of gestational regimen postpartum but continue insulin gtt until 2 hours after first dose of NPH (whether am/pm doesn't matter, the insulin gtt needs to be bridged with a 2 hour overlap) 4. Postpartum insulin regimen: NPH 10 units in AM and 8 units qhs.  Novolog 4/6/6 for breakfast/lunch/dinner immediately prior to meals.   5. Consider anesthesia consult at admission.  Time Spent: I spent in excess of 40 minutes in consultation with this patient to review records, evaluate her case, and provide her with an adequate discussion and education.  More than 50% of this time was spent in direct face-to-face counseling. It was a pleasure seeing your patient in the office today.  Thank you for consultation. Please do not hesitate to contact our service for any further questions.   Thank you,  Julie Nay Morgan Inman Fettig   Sinthia Karabin Morgan, MD, MS, FACOG Assistant Professor Section of Maternal-Fetal Medicine [employer]    

## 2015-10-28 NOTE — H&P (Addendum)
40 y.o. [redacted]w[redacted]d  G2P1001 transfered from MFM clinic for IUGR and oligo.  Otherwise has good fetal movement and no bleeding. Per Dr. Marjo Bicker:  Impressions: SIUP at [redacted]w[redacted]d EFW <10th UA Dopplers are normal A few loops of prominent small and large bowel are seen but are not technically dilated as caliber is less than 10mm placenta has a number of internal echogenicities, likely calcifications AFI is consistent with oligohydramnios BPP 6/10 (-2 no breathing, nonreactive NST) Fasting glycemic measures: 59-108;  2 hour postprandial measures: 60-120.   I spoke to your patient regarding her pregnancy with IUGR complicated now by oligohydramnios and poorly controlled DM. Given that BPP 6/10 and that is is >36 weeks she no longer meets criteria for expectant management of uncomplicated IUGR. She requires delivery.  Recommendations: 1. CEFM --> move towards delivery  2. Insulin gtt intrapartum 3. Would restart insulin regimen at 40% of gestational regimen postpartum but continue insulin gtt until 2 hours after first dose of NPH (whether am/pm doesn't matter, the insulin gtt needs to be bridged with a 2 hour overlap) 4. Postpartum insulin regimen: NPH 10 units in AM and 8 units qhs. Novolog 4/6/6 for breakfast/lunch/dinner immediately prior to meals.  5. Consider anesthesia consult at admission.    Past Medical History  Diagnosis Date  . Migraines   . Diabetes mellitus without complication [facility])     Past Surgical History  Procedure Laterality Date  . Spine surgery      2008, 2011, 2012    OB History  Gravida Para Term Preterm AB SAB TAB Ectopic Multiple Living  # Outcome Date GA Lbr Len/2nd Weight Sex Delivery Anes PTL Lv  2 Current           1 Term 09/25/04 [redacted]w[redacted]d   F Vag-Spont  N Y     Complications: Pre-eclampsia during pregnancy in third trimester, antepartum      Social History   Social History  . Marital Status: Single    Spouse Name: N/A  . Number of  Children: N/A  . Years of Education: N/A   Occupational History  . Not on file.   Social History Main Topics  . Smoking status: Never Smoker   . Smokeless tobacco: Never Used  . Alcohol Use: No     Comment: rarely  . Drug Use: No  . Sexual Activity: Yes    Birth Control/ Protection: None   Other Topics Concern  . Not on file   Social History Narrative   Fentanyl; Hydrocodone; Penicillins; Robaxin; and Exenatide    Prenatal Transfer Tool  Maternal Diabetes: Yes:  Diabetes Type:  Insulin/Medication controlled- poor control  Genetic Screening: Normal Maternal Ultrasounds/Referrals: Abnormal:  Findings:   IUGR Fetal Ultrasounds or other Referrals:  Referred to Materal Fetal Medicine and echo normal; today a few loops of bowel appear dilated Maternal Substance Abuse:  No Significant Maternal Medications:  Meds include: Other: insulin Significant Maternal Lab Results: Lab values include: Other:   Other PNC: uncomplicated.    Filed Vitals:   10/28/15 1555  BP: 128/84  Pulse: 85  Temp: 98.5 F (36.9 C)  Resp: 18     Lungs/Cor:  NAD Abdomen:  soft, gravid Ex:  no cords, erythema SVE:  1/20/-3, VTX FHTs:  120s, good STV, NST R Toco:  q 10   A/P   [redacted]w[redacted]d AMA with IUGR, oligo and BPP 6/8  GBS neg  Natalie Yoder A

## 2015-10-28 NOTE — H&P (Signed)
FHTs 130s, gSTV, NST R but now has had several late decels with contractions. Cat 2 tracing. Toco q4-5 SVE 1-2/50/-2, AROM clear, IUPC placed.  Will reevaluate in 30 minutes.  Given fetal evidence of compromise and long induction expected, may proceed with c/s if tracing does not improve.

## 2015-10-28 NOTE — Consult Note (Signed)
MFM Consult, Staff Note:  Impressions: SIUP at [redacted]w[redacted]d EFW <10th UA Dopplers are normal A few loops of prominent small and large bowel are seen but are not technically dilated as caliber is less than 10mm placenta has a number of internal echogenicities, likely calcifications AFI is consistent with oligohydramnios BPP 6/10 (-2 no breathing, nonreactive NST) Fasting glycemic measures:  59-108;  2 hour postprandial measures:  60-120.    Discussion:  I spoke to your patient regarding her pregnancy with IUGR complicated now by oligohydramnios and poorly controlled DM.  Given that BPP 6/10 and that is is >36 weeks she no longer meets criteria for expectant management of uncomplicated IUGR.  She requires delivery.  Recommendations: 1. CEFM  --> move towards delivery  2. Insulin gtt intrapartum 3. Would restart insulin regimen at 40% of gestational regimen postpartum but continue insulin gtt until 2 hours after first dose of NPH (whether am/pm doesn't matter, the insulin gtt needs to be bridged with a 2 hour overlap) 4. Postpartum insulin regimen: NPH 10 units in AM and 8 units qhs.  Novolog 4/6/6 for breakfast/lunch/dinner immediately prior to meals.   5. Consider anesthesia consult at admission.  Time Spent: I spent in excess of 40 minutes in consultation with this patient to review records, evaluate her case, and provide her with an adequate discussion and education.  More than 50% of this time was spent in direct face-to-face counseling. It was a pleasure seeing your patient in the office today.  Thank you for consultation. Please do not hesitate to contact our service for any further questions.   Thank you,  Louann Sjogren Gaynelle Arabian, Louann Sjogren, MD, MS, FACOG Assistant Professor Section of Maternal-Fetal Medicine [facility]

## 2015-10-28 NOTE — Progress Notes (Signed)
Late decels are gone- FHTs NST R.  Continue induction.

## 2015-10-29 ENCOUNTER — Encounter (HOSPITAL_COMMUNITY): Payer: Self-pay | Admitting: *Deleted

## 2015-10-29 ENCOUNTER — Encounter (HOSPITAL_COMMUNITY): Payer: Self-pay

## 2015-10-29 LAB — GLUCOSE, CAPILLARY
GLUCOSE-CAPILLARY: 81 mg/dL (ref 65–99)
GLUCOSE-CAPILLARY: 46 mg/dL — AB (ref 65–99)
Glucose-Capillary: 79 mg/dL (ref 65–99)
Glucose-Capillary: 83 mg/dL (ref 65–99)
Glucose-Capillary: 128 mg/dL — ABNORMAL HIGH (ref 65–99)
Glucose-Capillary: 41 mg/dL — CL (ref 65–99)
Glucose-Capillary: 72 mg/dL (ref 65–99)
Glucose-Capillary: 72 mg/dL (ref 65–99)

## 2015-10-29 LAB — CBC
HCT: 34.8 % — ABNORMAL LOW (ref 36.0–46.0)
Hemoglobin: 11.7 g/dL — ABNORMAL LOW (ref 12.0–15.0)
MCH: 27.3 pg (ref 26.0–34.0)
MCHC: 33.6 g/dL (ref 30.0–36.0)
MCV: 81.3 fL (ref 78.0–100.0)
PLATELETS: 314 10*3/uL (ref 150–400)
RBC: 4.28 MIL/uL (ref 3.87–5.11)
RDW: 14.9 % (ref 11.5–15.5)
WBC: 11.8 10*3/uL — ABNORMAL HIGH (ref 4.0–10.5)

## 2015-10-29 LAB — RPR: RPR: NONREACTIVE

## 2015-10-29 MED ORDER — MAGNESIUM HYDROXIDE 400 MG/5ML PO SUSP: 30.0000 mL | ORAL | Status: DC | PRN

## 2015-10-29 MED ORDER — INSULIN NPH (HUMAN) (ISOPHANE) 100 UNIT/ML ~~LOC~~ SUSP: 8.0000 [IU] | Freq: Every day | SUBCUTANEOUS | Status: DC

## 2015-10-29 MED ORDER — LANOLIN HYDROUS EX OINT: TOPICAL_OINTMENT | CUTANEOUS | Status: DC | PRN

## 2015-10-29 MED ORDER — TETANUS-DIPHTH-ACELL PERTUSSIS 5-2.5-18.5 LF-MCG/0.5 IM SUSP: 0.5000 mL | Freq: Once | INTRAMUSCULAR | Status: DC

## 2015-10-29 MED ORDER — FERROUS SULFATE 325 (65 FE) MG PO TABS
325.0000 mg | ORAL_TABLET | Freq: Two times a day (BID) | ORAL | Status: DC
  Administered 2015-10-29 – 2015-11-01 (×6): 325 mg via ORAL
  Filled 2015-10-29 (×7): qty 1

## 2015-10-29 MED ORDER — INSULIN NPH (HUMAN) (ISOPHANE) 100 UNIT/ML ~~LOC~~ SUSP
10.0000 [IU] | Freq: Every day | SUBCUTANEOUS | Status: DC
  Administered 2015-10-29: 10 [IU] via SUBCUTANEOUS
  Filled 2015-10-29: qty 10

## 2015-10-29 MED ORDER — INSULIN ASPART 100 UNIT/ML ~~LOC~~ SOLN: 6.0000 [IU] | Freq: Every day | SUBCUTANEOUS | Status: DC

## 2015-10-29 MED ORDER — INSULIN ASPART 100 UNIT/ML ~~LOC~~ SOLN
4.0000 [IU] | Freq: Every day | SUBCUTANEOUS | Status: DC
  Administered 2015-10-29: 4 [IU] via SUBCUTANEOUS

## 2015-10-29 MED ORDER — DIPHENHYDRAMINE HCL 25 MG PO CAPS
25.0000 mg | ORAL_CAPSULE | Freq: Four times a day (QID) | ORAL | Status: DC | PRN
  Administered 2015-10-29: 25 mg via ORAL
  Filled 2015-10-29: qty 1

## 2015-10-29 MED ORDER — MEASLES, MUMPS & RUBELLA VAC ~~LOC~~ INJ
0.5000 mL | INJECTION | Freq: Once | SUBCUTANEOUS | Status: DC
  Filled 2015-10-29: qty 0.5

## 2015-10-29 MED ORDER — WITCH HAZEL-GLYCERIN EX PADS: 1.0000 "application " | MEDICATED_PAD | CUTANEOUS | Status: DC | PRN

## 2015-10-29 MED ORDER — SODIUM CHLORIDE 0.9% FLUSH: 3.0000 mL | Freq: Two times a day (BID) | INTRAVENOUS | Status: DC

## 2015-10-29 MED ORDER — ACETAMINOPHEN 325 MG PO TABS: 650.0000 mg | ORAL_TABLET | ORAL | Status: DC | PRN

## 2015-10-29 MED ORDER — PRENATAL MULTIVITAMIN CH
1.0000 | ORAL_TABLET | Freq: Every day | ORAL | Status: DC
  Administered 2015-10-29 – 2015-11-01 (×4): 1 via ORAL
  Filled 2015-10-29 (×3): qty 1

## 2015-10-29 MED ORDER — METHYLERGONOVINE MALEATE 0.2 MG/ML IJ SOLN: 0.2000 mg | INTRAMUSCULAR | Status: DC | PRN

## 2015-10-29 MED ORDER — SIMETHICONE 80 MG PO CHEW: 80.0000 mg | CHEWABLE_TABLET | ORAL | Status: DC | PRN

## 2015-10-29 MED ORDER — SENNOSIDES-DOCUSATE SODIUM 8.6-50 MG PO TABS
2.0000 | ORAL_TABLET | ORAL | Status: DC
  Administered 2015-10-30 – 2015-11-01 (×3): 2 via ORAL
  Filled 2015-10-29 (×3): qty 2

## 2015-10-29 MED ORDER — ALBUTEROL SULFATE (2.5 MG/3ML) 0.083% IN NEBU: 3.0000 mL | INHALATION_SOLUTION | RESPIRATORY_TRACT | Status: DC | PRN

## 2015-10-29 MED ORDER — METHYLERGONOVINE MALEATE 0.2 MG PO TABS: 0.2000 mg | ORAL_TABLET | ORAL | Status: DC | PRN

## 2015-10-29 MED ORDER — ZOLPIDEM TARTRATE 5 MG PO TABS: 5.0000 mg | ORAL_TABLET | Freq: Every evening | ORAL | Status: DC | PRN

## 2015-10-29 MED ORDER — DIBUCAINE 1 % RE OINT: 1.0000 "application " | TOPICAL_OINTMENT | RECTAL | Status: DC | PRN

## 2015-10-29 MED ORDER — ONDANSETRON HCL 4 MG/2ML IJ SOLN: 4.0000 mg | INTRAMUSCULAR | Status: DC | PRN

## 2015-10-29 MED ORDER — BENZOCAINE-MENTHOL 20-0.5 % EX AERO
1.0000 "application " | INHALATION_SPRAY | CUTANEOUS | Status: DC | PRN
  Administered 2015-10-29: 1 via TOPICAL
  Filled 2015-10-29: qty 56

## 2015-10-29 MED ORDER — IBUPROFEN 800 MG PO TABS
800.0000 mg | ORAL_TABLET | Freq: Three times a day (TID) | ORAL | Status: DC
  Administered 2015-10-29 – 2015-11-01 (×9): 800 mg via ORAL
  Filled 2015-10-29 (×9): qty 1

## 2015-10-29 MED ORDER — ONDANSETRON HCL 4 MG PO TABS: 4.0000 mg | ORAL_TABLET | ORAL | Status: DC | PRN

## 2015-10-29 MED ORDER — SODIUM CHLORIDE 0.9% FLUSH: 3.0000 mL | INTRAVENOUS | Status: DC | PRN

## 2015-10-29 MED ORDER — INSULIN ASPART 100 UNIT/ML ~~LOC~~ SOLN: 0.0000 [IU] | Freq: Three times a day (TID) | SUBCUTANEOUS | Status: DC

## 2015-10-29 MED ORDER — SODIUM CHLORIDE 0.9 % IV SOLN: 250.0000 mL | INTRAVENOUS | Status: DC | PRN

## 2015-10-29 MED ORDER — LIDOCAINE HCL (PF) 1 % IJ SOLN
INTRAMUSCULAR | Status: DC | PRN
  Administered 2015-10-29: 2 mL via EPIDURAL
  Administered 2015-10-29: 5 mL via EPIDURAL
  Administered 2015-10-29: 3 mL via EPIDURAL

## 2015-10-29 NOTE — Progress Notes (Addendum)
Inpatient Diabetes Program Recommendations  AACE/ADA: New Consensus Statement on Inpatient Glycemic Control (2015)  Target Ranges:  Prepandial:   less than 140 mg/dL      Peak postprandial:   less than 180 mg/dL (1-2 hours)      Critically ill patients:  140 - 180 mg/dL   Review of Glycemic Control: Paged by RN regarding insulin needs following delivery per Dr Dareen Piano Diabetes history: Pre-existing Diabetes and diabetes during pregnancy Outpatient Diabetes medications: Has been on NPH bid and novolog meal coverage tidwc during pregnancy:  NPH 22 units in the am and 20 units at HS and novolog 8 units breakfast, 12 units lunch and 12 units for dinner.  Patient was taking Victoza prior to pregnancy Current orders for Inpatient glycemic control: Inpatient Diabetes Program Recommendations:    Spoke with patient on the phone who states she was not on insulin prior to her pregnancy. She does not appear to need much if any insulin at this time. Post-partum sensitivity to insulin is typically increased in the first 24-48 hours of longer. Recommend to use the Glycemic Control Order using the sensitive correction scale tidwc and HS correction scales only at this point. Will call RN to give her this recommendation as Dr Dareen Piano requested.  I am glad to follow and assist in any way.  Thank you Lenor Coffin, RN, MSN, CDE  Diabetes Inpatient Program Office: [phone] Pager: [phone] 8:00 am to 5:00 pm

## 2015-10-29 NOTE — Progress Notes (Signed)
Hypoglycemic Event  CBG:46  Treatment: 4 oz juice  Symptoms: numb tongue,hot  Follow-up CBG: Time1820 CBG Result:72  Possible Reasons for Event: she didn't eat her 1400 snacker  Comments/MD notified:DC paged for ord      [place], Corrie Dandy

## 2015-10-29 NOTE — Anesthesia Preprocedure Evaluation (Signed)
Anesthesia Evaluation  Patient identified by MRN, date of birth, ID band Patient awake    Reviewed: Allergy & Precautions, NPO status , Patient's Chart, lab work & pertinent test results  Airway Mallampati: II  TM Distance: >3 FB Neck ROM: Full    Dental  (+) Teeth Intact, Dental Advisory Given   Pulmonary neg pulmonary ROS,    Pulmonary exam normal breath sounds clear to auscultation       Cardiovascular negative cardio ROS Normal cardiovascular exam Rhythm:Regular Rate:Normal     Neuro/Psych  Headaches, L5-S1 fusion negative psych ROS   GI/Hepatic Neg liver ROS, GERD  Medicated,  Endo/Other  diabetes, Insulin Dependentobesity  Renal/GU negative Renal ROS     Musculoskeletal negative musculoskeletal ROS (+)   Abdominal   Peds  Hematology negative hematology ROS (+) Plt 379k   Anesthesia Other Findings Day of surgery medications reviewed with the patient.  Reproductive/Obstetrics (+) Pregnancy                             Anesthesia Physical Anesthesia Plan  ASA: III  Anesthesia Plan: Epidural   Post-op Pain Management:    Induction:   Airway Management Planned:   Additional Equipment:   Intra-op Plan:   Post-operative Plan:   Informed Consent: I have reviewed the patients History and Physical, chart, labs and discussed the procedure including the risks, benefits and alternatives for the proposed anesthesia with the patient or authorized representative who has indicated his/her understanding and acceptance.   Dental advisory given  Plan Discussed with:   Anesthesia Plan Comments: (Patient identified. Risks/Benefits/Options discussed with patient including but not limited to bleeding, infection, nerve damage, paralysis, failed block, incomplete pain control, headache, blood pressure changes, nausea, vomiting, reactions to medication both or allergic, itching and postpartum  back pain. Confirmed with bedside nurse the patient's most recent platelet count. Confirmed with patient that they are not currently taking any anticoagulation, have any bleeding history or any family history of bleeding disorders. Patient expressed understanding and wished to proceed. All questions were answered. )        Anesthesia Quick Evaluation

## 2015-10-29 NOTE — Anesthesia Postprocedure Evaluation (Signed)
Anesthesia Post Note  Patient: Natalie Yoder  Procedure(s) Performed: * No procedures listed *  Patient location during evaluation: Mother Baby Anesthesia Type: Epidural Level of consciousness: awake and alert Pain management: pain level controlled Vital Signs Assessment: post-procedure vital signs reviewed and stable Respiratory status: spontaneous breathing Cardiovascular status: stable Postop Assessment: no headache, no backache, no signs of nausea or vomiting and adequate PO intake Anesthetic complications: no    Last Vitals:  Filed Vitals:   10/29/15 0605 10/29/15 0650  BP: 113/70 110/62  Pulse: 83 79  Temp: 36.8 C 36.6 C  Resp: 20 20    Last Pain:  Filed Vitals:   10/29/15 0707  PainSc: 6                  Semaj Coburn Hristova

## 2015-10-29 NOTE — Lactation Note (Signed)
This note was copied from the chart of Natalie Yoder. Lactation Consultation Note  Initial visit made.  Breastfeeding consultation services and support information, late preterm handout given to patient.  Mom did not breastfeed her first baby.  Baby is 10 hours old and not latching yet but given formula supplementation x 2.  Reviewed volume parameters and late preterm behavior with mom.  Instructed to watch for feeding cues and to call out for latch assist.  Discussed importance of post pumping every 3 hours and supplementing with expressed milk/formula.  Explained to mom that she will need to pump the first few weeks at home to maintain milk supply while the baby is becoming more effective at breast.  She has WIC in [place].  Encouraged her to call [facility] and inform them she has delivered a preterm baby and will need a pump after discharge.  Patient Name: Natalie Yoder ZOXWR'U Date: 10/29/2015 Reason for consult: Initial assessment;Infant < 6lbs;Late preterm infant   Maternal Data Formula Feeding for Exclusion: Yes Reason for exclusion: Mother's choice to formula and breast feed on admission Has patient been taught Hand Expression?: Yes Does the patient have breastfeeding experience prior to this delivery?: No  Feeding Feeding Type: Formula Nipple Type: Slow - flow  LATCH Score/Interventions                      Lactation Tools Discussed/Used WIC Program: Yes Pump Review: Setup, frequency, and cleaning;Milk Storage Initiated by:: LC Date initiated:: 10/29/15   Consult Status Consult Status: Follow-up Date: 10/30/15 Follow-up type: In-patient    Huston Foley 10/29/2015, 1:30 PM

## 2015-10-29 NOTE — Anesthesia Procedure Notes (Signed)
Epidural Patient location during procedure: OB  Staffing Anesthesiologist: Justene Jensen EDWARD Performed by: anesthesiologist   Preanesthetic Checklist Completed: patient identified, pre-op evaluation, timeout performed, IV checked, risks and benefits discussed and monitors and equipment checked  Epidural Patient position: sitting Prep: DuraPrep Patient monitoring: blood pressure and continuous pulse ox Approach: midline Location: L3-L4 Injection technique: LOR air  Needle:  Needle type: Tuohy  Needle gauge: 17 G Needle length: 9 cm Needle insertion depth: 7 cm Catheter size: 19 Gauge Catheter at skin depth: 12 cm Test dose: negative and Other (1% Lidocaine)  Additional Notes Patient identified.  Risk benefits discussed including failed block, incomplete pain control, headache, nerve damage, paralysis, blood pressure changes, nausea, vomiting, reactions to medication both toxic or allergic, and postpartum back pain.  Patient expressed understanding and wished to proceed.  All questions were answered.  Sterile technique used throughout procedure and epidural site dressed with sterile barrier dressing. No paresthesia or other complications noted. The patient did not experience any signs of intravascular injection such as tinnitus or metallic taste in mouth nor signs of intrathecal spread such as rapid motor block. Please see nursing notes for vital signs. Reason for block:procedure for pain   

## 2015-10-29 NOTE — Progress Notes (Signed)
Diabetic Coordinator paged twice with no response. Report given to [place] Noble,RN

## 2015-10-29 NOTE — Progress Notes (Signed)
Hypoglycemic Event  CBG: 41   Treatment: 4 oz apple juice  Symptoms: Sweaty and Shaky  Follow-up CBG: Time:1235 CBG Result:72  Possible Reasons for Event: Medication regimen: given too much insulin in am  Comments/MD notified:Dr. Dareen Piano notified at 1254. Told to call Diabetes Coordinator.  Spoke to [employer].     [name]

## 2015-10-30 LAB — GLUCOSE, CAPILLARY
GLUCOSE-CAPILLARY: 184 mg/dL — AB (ref 65–99)
GLUCOSE-CAPILLARY: 84 mg/dL (ref 65–99)
GLUCOSE-CAPILLARY: 102 mg/dL — AB (ref 65–99)
Glucose-Capillary: 99 mg/dL (ref 65–99)
Glucose-Capillary: 158 mg/dL — ABNORMAL HIGH (ref 65–99)

## 2015-10-30 MED ORDER — MENTHOL 3 MG MT LOZG
1.0000 | LOZENGE | OROMUCOSAL | Status: DC | PRN
  Filled 2015-10-30: qty 9

## 2015-10-30 NOTE — Lactation Note (Signed)
This note was copied from the chart of Natalie Fonnie Mu. Lactation Consultation Note  Patient Name: Natalie Yoder ZOXWR'U Date: 10/30/2015 Reason for consult: Follow-up assessment   With this mom of a LPI, now 36 5/7 weeks CGa, and 37 hours old. She weighed 4 lb 10.6 oz last night. Mom is doing well with pumping and supplementing with EBm and then formula, but she did not realize she needs to pump at night also. I also reinforced that mom has to supplement, ater breastfeeding, at least every 3 hours, and to continue with this once home, because Tyra is so small. Mom very receptive to teaching, and holding baby skin to skin. I advised mom to put baby to brest if both she and Tyra have the energy to do so, otherwise bottle feed and pump. Mom knows to call for questions/concerns.    Maternal Data    Feeding Feeding Type: Formula Nipple Type: Slow - flow  LATCH Score/Interventions                      Lactation Tools Discussed/Used     Consult Status Consult Status: Follow-up Date: 10/31/15 Follow-up type: In-patient    Alfred Levins 10/30/2015, 4:52 PM

## 2015-10-30 NOTE — Progress Notes (Signed)
Inpatient Diabetes Program Recommendations  AACE/ADA: New Consensus Statement on Inpatient Glycemic Control (2015)  Target Ranges:  Prepandial:   less than 140 mg/dL      Peak postprandial:   less than 180 mg/dL (1-2 hours)      Critically ill patients:  140 - 180 mg/dL   Review of Glycemic Control Noted Diabetes Coordinator paged X 2 after 1700 yesterday evening.  Our department in on call from 0800 to 1700,  7 days a week. Diabetes Coordinator Lenor Coffin)  had spoken with RN previously yesterday mid-day after page from RN requesting recommendations on glucose control post-partum.  Please see previous note from 10/29/15. However, patient had already been given 10 units NPH in the am and 4 units meal coverage per MD order. Explained to the RN that after delivery (previous note) and after speaking with patient over the phone regarding her dm history, the sensitive correction insulin tidwc could be used and the HS correction scale (which begins at 201 mg/dL with 2 units correction novolog). Half-life of NPH is 10-12 hours (varying somewhat from patient to patient). The NPH most probably was still active and was the primary cause of the hypoglycemia at 1800). NPH and novolog meal coverage was discontinued per recommendation in note when paged and spoke with RN to confirm with MD. Will follow and glad to assist in any way.  Thank you Lenor Coffin, RN, MSN, CDE  Diabetes Inpatient Program Office: [phone] Pager: [phone] 8:00 am to 5:00 pm

## 2015-10-30 NOTE — Progress Notes (Signed)
Patient is doing well.  She is ambulating, voiding, tolerating PO.  Pain control is good.  Lochia is appropriate  Received 10U NPH yesterday AM--had low BG at lunch/dinner Seen by diabetes coordinator yesterday who recommended only sliding scale insulin prn w meals and qhs  Filed Vitals:   10/29/15 0650 10/29/15 1120 10/29/15 1840 10/30/15 0656  BP: 110/62 112/65 129/70 116/68  Pulse: 79 82 80 83  Temp: 97.8 F (36.6 C) 98 F (36.7 C) 97.8 F (36.6 C) 97.8 F (36.6 C)  TempSrc: Oral Oral Oral Oral  Resp: _0 Height:      Weight:      SpO2:   100%     NAD Fundus firm Ext: no edema  Lab Results  Component Value Date   WBC 11.8* 10/29/2015   HGB 11.7* 10/29/2015   HCT 34.8* 10/29/2015   MCV 81.3 10/29/2015   PLT 314 10/29/2015    --/--/AB POS (01/24 1630)/RNI  A/P 40 y.o. [id number] PPD#1 s/p SVD at 36 wks for IUGR. Routine care.   Will monitor BG with sliding scale insulin today, adjust as needed RNI--MMR prior to d/c Expect d/c tomorrow.    [place]

## 2015-10-31 LAB — GLUCOSE, CAPILLARY
GLUCOSE-CAPILLARY: 102 mg/dL — AB (ref 65–99)
GLUCOSE-CAPILLARY: 83 mg/dL (ref 65–99)
GLUCOSE-CAPILLARY: 111 mg/dL — AB (ref 65–99)
Glucose-Capillary: 92 mg/dL (ref 65–99)
Glucose-Capillary: 100 mg/dL — ABNORMAL HIGH (ref 65–99)
Glucose-Capillary: 115 mg/dL — ABNORMAL HIGH (ref 65–99)
Glucose-Capillary: 116 mg/dL — ABNORMAL HIGH (ref 65–99)

## 2015-10-31 NOTE — Lactation Note (Addendum)
This note was copied from the chart of Natalie Yoder. Lactation Consultation Note  Patient Name: Natalie Yoder ZOXWR'U Date: 10/31/2015 Reason for consult: Follow-up assessment   Maternal Data  Baby 4lb10oz and [redacted]w[redacted]d CGA. Mom reports that she has just finished giving 21 ml of formula by bottle and now needs to pump. Mom also states that baby sleepy at breat and not wanting to latch well. Enc mom to put baby to breast first with cues, and at least every 3 hours. Enc mom to call for assistance with latching as needed to work on getting a deeper latch. Enc mom to supplement with EBM/formula according to supplementation guidelines, which mom has at bedside. Enc mom to postpump after baby fed, followed by hand expression and enc keeping EBM at bedside for next feeding. Discussed EBM storage guidelines as well.   Mom did not nurse her first child and does not remember her milk "coming in" at all. Enc mom to call insurance company to get her DEBP and mom aware of 2-week hospital DEBP rental. Discussed limiting the total feeding time to 30, and reviewed LPI guidelines.   Feeding Feeding Type: Bottle Fed - Formula Nipple Type: Slow - flow  LATCH Score/Interventions                      Lactation Tools Discussed/Used     Consult Status      Nancy Nordmann, Natalie Yoder 10/31/2015, 9:20 AM

## 2015-11-01 LAB — GLUCOSE, CAPILLARY: GLUCOSE-CAPILLARY: 100 mg/dL — AB (ref 65–99)

## 2015-11-01 MED ORDER — IBUPROFEN 800 MG PO TABS: 800.0000 mg | ORAL_TABLET | Freq: Three times a day (TID) | ORAL | Status: DC

## 2015-11-01 NOTE — Lactation Note (Signed)
This note was copied from the chart of Natalie Yoder. Lactation Consultation Note  Mother and baby being discharged. Discussed the importance of pumping every 3 hours w/ DEBP. Mother states she will use hand pump until she gets her DEBP from [facility] tomorrow.  Offered her [facility] loaner and she refused. Provided her with an extra hand pump. Reviewed the importance of feeding baby every 3 hours, volume guidelines and hand expression. Reviewed engorgement care and monitoring voids/stools.   Patient Name: Natalie Jodi Criscuolo ZOXWR'U Date: 11/01/2015 Reason for consult: Follow-up assessment   Maternal Data    Feeding    LATCH Score/Interventions                      Lactation Tools Discussed/Used     Consult Status Consult Status: Complete    Hardie Pulley 11/01/2015, 12:00 PM

## 2015-11-01 NOTE — Discharge Summary (Signed)
Obstetric Discharge Summary Reason for Admission: induction of labor Prenatal Procedures: NST and ultrasound Intrapartum Procedures: spontaneous vaginal delivery Postpartum Procedures: none Complications-Operative and Postpartum: none HEMOGLOBIN  Date Value Ref Range Status  10/29/2015 11.7* 12.0 - 15.0 g/dL Final  16/07/9603 54.0 12.2 - 16.2 g/dL Final   HCT  Date Value Ref Range Status  10/29/2015 34.8* 36.0 - 46.0 % Final   HCT, POC  Date Value Ref Range Status  11/21/2013 43.4 37.7 - 47.9 % Final    Physical Exam:  General: alert Lochia: appropriate Uterine Fundus: firm   Discharge Diagnoses: IUP and diabetes  Discharge Information: Date: 11/01/2015 Activity: Diabetes and IUP Diet: routine Medications: PNV and Ibuprofen Condition: stable Instructions: refer to practice specific booklet Discharge to: home Follow-up Information    Follow up with HORVATH,MICHELLE A, MD. Schedule an appointment as soon as possible for a visit in 1 month.   Specialty:  Obstetrics and Gynecology   Contact information:   [address] [place] [address] [phone]       Newborn Data: Live born female  Birth Weight: 4 lb 13.3 oz (2190 g) APGAR: 9, 9  Home with mother.  ANDERSON,MARK E 11/01/2015, 9:48 AM

## 2015-11-01 NOTE — Progress Notes (Signed)
MOB given hand pump. Education provided on maintenance, use, and cleaning. MOB demonstrated teach back. Encouraged to call out with questions. Sherald Barge

## 2015-11-01 NOTE — Progress Notes (Signed)
Pt without complaints. Blood sugars well controlled. VSSAF IMP/ Stable  Plan/Discharge.

## 2015-11-04 ENCOUNTER — Telehealth: Payer: Self-pay | Admitting: Nurse Practitioner

## 2015-12-04 DIAGNOSIS — Z0289 Encounter for other administrative examinations: Secondary | ICD-10-CM

## 2015-12-05 DIAGNOSIS — R8271 Bacteriuria: Secondary | ICD-10-CM | POA: Diagnosis not present

## 2016-04-23 ENCOUNTER — Ambulatory Visit (INDEPENDENT_AMBULATORY_CARE_PROVIDER_SITE_OTHER): Payer: Medicare Other | Admitting: Pediatrics

## 2016-04-23 ENCOUNTER — Encounter: Payer: Self-pay | Admitting: Pediatrics

## 2016-04-23 VITALS — BP 131/79 | HR 89 | Temp 97.1°F | Ht 66.0 in | Wt 226.2 lb

## 2016-04-23 DIAGNOSIS — E118 Type 2 diabetes mellitus with unspecified complications: Secondary | ICD-10-CM | POA: Diagnosis not present

## 2016-04-23 DIAGNOSIS — Z794 Long term (current) use of insulin: Secondary | ICD-10-CM

## 2016-04-23 DIAGNOSIS — M545 Low back pain, unspecified: Secondary | ICD-10-CM

## 2016-04-23 DIAGNOSIS — M4322 Fusion of spine, cervical region: Secondary | ICD-10-CM | POA: Diagnosis not present

## 2016-04-23 DIAGNOSIS — E1165 Type 2 diabetes mellitus with hyperglycemia: Secondary | ICD-10-CM | POA: Diagnosis not present

## 2016-04-23 DIAGNOSIS — IMO0002 Reserved for concepts with insufficient information to code with codable children: Secondary | ICD-10-CM

## 2016-04-23 DIAGNOSIS — M62830 Muscle spasm of back: Secondary | ICD-10-CM | POA: Diagnosis not present

## 2016-04-23 LAB — BAYER DCA HB A1C WAIVED

## 2016-04-23 MED ORDER — CYCLOBENZAPRINE HCL 5 MG PO TABS: 5.0000 mg | ORAL_TABLET | Freq: Three times a day (TID) | ORAL | Status: DC | PRN

## 2016-04-23 MED ORDER — IBUPROFEN 800 MG PO TABS: 800.0000 mg | ORAL_TABLET | Freq: Three times a day (TID) | ORAL | Status: DC

## 2016-04-23 NOTE — Progress Notes (Signed)
Subjective:    Patient ID: Natalie Yoder, female    DOB: 1976-04-10, 40 y.o.   MRN: [id number]  CC: Back Pain; Hip Pain; and Disability form   HPI: Natalie Yoder is a 40 y.o. female presenting for Back Pain; Hip Pain; and Disability form  Had a L5-S1 fusion in 2008 Had a baby 5 months ago Since then has had to pull herself up the steps Had a neck fusion in 2011 Now has a catch in L shoulder starting last few months since baby born, medial L shoulder blade, pain shooting down L arm at times, also with numbness Feels weaker in L arm, needs help unscrewing baby bottles Not taking anything for pain now  Prior to back pain limiting her mobility she worked as Lawyer[employer]NA. She says she can sit for 5 min before needing to get up and move around, stand for about 5 min before needing to sit, walk or change position. Walking she did ok walking in from parkin glot to back of clinic today, says she needs to take it slow when walking longer distances.  DM2: not takin glantus, not following up with anyone for DM2 regularly since delivery in January.  [facility] 04/23/2016 10/28/2015 05/02/2015  Decreased Interest 0 0 1  Down, Depressed, Hopeless 1 0 1  PHQ - 2 Score 1 0 2     Relevant past medical, surgical, family and social history reviewed and updated as indicated.  Interim medical history since our last visit reviewed. Allergies and medications reviewed and updated.  ROS: Per HPI unless specifically indicated above  History  Smoking status  . Never Smoker   Smokeless tobacco  . Never Used       Objective:    BP 131/79 mmHg  Pulse 89  Temp(Src) 97.1 F (36.2 C) (Oral)  Ht 5\' 6"  (1.676 m)  Wt 226 lb 3.2 oz (102.604 kg)  BMI 36.53 kg/m2  LMP 04/02/2016  Breastfeeding? No  Wt Readings from Last 3 Encounters:  04/23/16 226 lb 3.2 oz (102.604 kg)  10/28/15 218 lb (98.884 kg)  10/28/15 218 lb (98.884 kg)     Gen: NAD, alert, cooperative with exam, NCAT EYES: EOMI,  no scleral injection or icterus CV: NRRR, normal S1/S2, no murmur, distal pulses 2+ b/l Resp: CTABL, no wheezes, normal WOB Abd: +BS, soft, NTND.  Ext: No edema, warm Neuro: Alert and oriented, strength equal b/l UE and LE, coordination grossly normal MSK: Tender with deep palpation over greater trochanters b/l, normal ROM hips b/l with internal and external rotation. Sensation intact b/l upper ext, TTP muscle bodies around scapula.     Assessment & Plan:    Natalie Yoder was seen today for back pain, hip pain and disability form. Last seen in our clinic over two years ago for a rash and [facility]   Diagnoses and all orders for this visit:  Cervical vertebral fusion H/o moderate central stenosis, has had C5-6 laminectomy and C4-5 fusion. Symptoms started with L shoulder and neck pain. Improved after surgery, now pain and symptoms have returned since delivering baby in January. Has some numbness, sharp shooting pains down L arm. -     ibuprofen (ADVIL,MOTRIN) 800 MG tablet; Take 1 tablet (800 mg total) by mouth 3 (three) times daily. -     Ambulatory referral to Orthopedic Surgery  Bilateral low back pain without sciatica From review of the imaging that I have access to, pt with  a h/o spinal stenosis, underwent L5-S1 fusion in 2008. Pt walked into clinic today, not using any assist devices Unclear to me how limited her ADLs are by her pain. Discussed with her I am not able to sign disability paperwork with the information I have available to me. -     ibuprofen (ADVIL,MOTRIN) 800 MG tablet; Take 1 tablet (800 mg total) by mouth 3 (three) times daily as needed -     Ambulatory referral to Orthopedic Surgery  B/l greater trochanteric bursitis  Discussed options including injections, exercises and stretches, pt opted for home exercise/stretches for now. Will let me know how pain progresses.  Muscle spasm of back L mid-upper back paraspinous muscles tight. Flexeril  has helped in past -     cyclobenzaprine (FLEXERIL) 5 MG tablet; Take 1 tablet (5 mg total) by mouth 3 (three) times daily as needed for muscle spasms.  Uncontrolled type 2 diabetes mellitus with complication, with long-term current use of insulin (HCC) Taking fast acting insulin 8 u TID Has not taken lantus since delivery in January. Plan at discharge was SSI as needed with meals and QHS Most recent hga1c from Dec 2016 was 10 -     Bayer DCA Hb A1c Waived    Follow up plan: 1-2 weeks for DM2 follow up  Rex Kras, MD [facility] 04/23/2016, 4:34 PM

## 2016-04-26 ENCOUNTER — Telehealth: Payer: Self-pay | Admitting: Nurse Practitioner

## 2016-04-26 NOTE — Telephone Encounter (Signed)
Reviewed results and pt given appt with [facility] 7/26 at 2:40 to discuss Hgb A1C being greater than 14.

## 2016-04-28 ENCOUNTER — Ambulatory Visit (INDEPENDENT_AMBULATORY_CARE_PROVIDER_SITE_OTHER): Payer: Medicare Other | Admitting: Pediatrics

## 2016-04-28 ENCOUNTER — Ambulatory Visit: Payer: Self-pay | Admitting: Family

## 2016-04-28 ENCOUNTER — Encounter: Payer: Self-pay | Admitting: Pediatrics

## 2016-04-28 VITALS — BP 136/88 | HR 85 | Temp 97.9°F | Ht 66.0 in | Wt 230.8 lb

## 2016-04-28 DIAGNOSIS — Z794 Long term (current) use of insulin: Secondary | ICD-10-CM

## 2016-04-28 DIAGNOSIS — E1165 Type 2 diabetes mellitus with hyperglycemia: Secondary | ICD-10-CM | POA: Diagnosis not present

## 2016-04-28 DIAGNOSIS — M4322 Fusion of spine, cervical region: Secondary | ICD-10-CM

## 2016-04-28 DIAGNOSIS — IMO0002 Reserved for concepts with insufficient information to code with codable children: Secondary | ICD-10-CM

## 2016-04-28 DIAGNOSIS — Z6837 Body mass index (BMI) 37.0-37.9, adult: Secondary | ICD-10-CM

## 2016-04-28 DIAGNOSIS — I10 Essential (primary) hypertension: Secondary | ICD-10-CM

## 2016-04-28 DIAGNOSIS — E118 Type 2 diabetes mellitus with unspecified complications: Secondary | ICD-10-CM

## 2016-04-28 MED ORDER — INSULIN GLARGINE 100 UNIT/ML SOLOSTAR PEN: 10.0000 [IU] | PEN_INJECTOR | Freq: Every day | SUBCUTANEOUS | 11 refills | Status: DC

## 2016-04-28 MED ORDER — INSULIN LISPRO 100 UNIT/ML (KWIKPEN): 8.0000 [IU] | PEN_INJECTOR | Freq: Three times a day (TID) | SUBCUTANEOUS | 11 refills | Status: DC

## 2016-04-28 MED ORDER — INSULIN PEN NEEDLE 31G X 6 MM MISC: 1.0000 "pen " | Freq: Every day | 3 refills | Status: DC

## 2016-04-28 MED ORDER — ONETOUCH ULTRASOFT LANCETS MISC: 12 refills | Status: AC

## 2016-04-28 MED ORDER — GLUCOSE BLOOD VI STRP: ORAL_STRIP | 12 refills | Status: AC

## 2016-04-28 NOTE — Patient Instructions (Addendum)
Lantus/glargine 10 units every night Check blood sugar every morning before eating and at night before dinner Continue to take 8 units of humalog with meals

## 2016-04-28 NOTE — Progress Notes (Signed)
Subjective:    Patient ID: Natalie Yoder, female    DOB: 09-30-1976, 40 y.o.   MRN: [id number]  CC: Follow-up (A1C greater than 14)   HPI: Natalie Yoder is a 40 y.o. female presenting for Follow-up (A1C greater than 14)  Diet: unsweet tea Water Avoiding sweets Eating veg, fruit, minimal carbs  L arm pain improved Still feels bad at times Older child 11yo carrying baby for her Not able to to pick up 6 mo or hold for long periods of time Can move around home alright Mother lives at home with her, helping out with baby  DM2 dx in apprx 2002 On metformin and glimep. Then switched to victoza  During recent pregnancy  Taking 10u NPH BID 8u humalog qAC   Depression screen [facility] 2/9 04/28/2016 04/23/2016 10/28/2015 05/02/2015  Decreased Interest 0 0 0 1  Down, Depressed, Hopeless 0 1 0 1  PHQ - 2 Score 0 1 0 2     Relevant past medical, surgical, family and social history reviewed and updated as indicated.  Interim medical history since our last visit reviewed. Allergies and medications reviewed and updated.  ROS: Per HPI unless specifically indicated above  History  Smoking Status  . Never Smoker  Smokeless Tobacco  . Never Used       Objective:    BP 136/88 (BP Location: Right Arm, Patient Position: Sitting, Cuff Size: Large)   Pulse 85   Temp 97.9 F (36.6 C) (Oral)   Ht  (1.676 m)   Wt 230 lb 12.8 oz (104.7 kg)   LMP 04/02/2016   BMI 37.25 kg/m   Wt Readings from Last 3 Encounters:  04/28/16 230 lb 12.8 oz (104.7 kg)  04/23/16 226 lb 3.2 oz (102.6 kg)  10/28/15 218 lb (98.9 kg)     Gen: NAD, alert, cooperative with exam, NCAT EYES: EOMI, no scleral injection or icterus CV: NRRR, normal S1/S2, no murmur, distal pulses 2+ b/l Resp: CTABL, no wheezes, normal WOB Ext: No edema, warm, normal foot exam Neuro: Alert and oriented, decreased sensation to touch L arm from shoulder to hand, normal sensation LE Hand grip 5/5 b/l 5/5 strength b/l knee  flex/ext MSK: full rom with turning neck to R, limited to apprx 15 degrees to L     Assessment & Plan:    Natalie Yoder was seen today for follow-up DM2 and med problems.  Diagnoses and all orders for this visit:  Uncontrolled type 2 diabetes mellitus with complication, with long-term current use of insulin (HCC) Start lantus Continue fast acting insulin with meals Let me know if BGLs remain elevated, right down and bring to clinic visit in 3 weeks -     Insulin Glargine (LANTUS) 100 UNIT/ML Solostar Pen; Inject 10 Units into the skin daily at 10 pm. -     Insulin Pen Needle (EXEL COMFORT POINT PEN NEEDLE) 31G X 6 MM MISC; 1 pen by Does not apply route daily. -     glucose blood (ONE TOUCH ULTRA TEST) test strip; Use as instructed -     Lancets (ONETOUCH ULTRASOFT) lancets; Use as instructed -     insulin lispro (HUMALOG KWIKPEN) 100 UNIT/ML KiwkPen; Inject 0.08 mLs (8 Units total) into the skin 3 (three) times daily.  Cervical vertebral fusion Numbness L arm slightly improved form last visit, still bothering her daily Needing assistance from family to help with care for 56mo  Essential hypertension Cont diet/lufestyle changes Slightly elevated today  BMI 37.0-37.9,  adult Continue lifestyle changes    Follow up plan: Return in about 3 weeks (around 05/19/2016).  Rex Kras, MD [facility] 04/28/2016, 3:45 PM

## 2016-04-29 DIAGNOSIS — Z6837 Body mass index (BMI) 37.0-37.9, adult: Secondary | ICD-10-CM | POA: Insufficient documentation

## 2016-05-12 ENCOUNTER — Telehealth: Payer: Self-pay | Admitting: Nurse Practitioner

## 2016-05-12 DIAGNOSIS — M5126 Other intervertebral disc displacement, lumbar region: Secondary | ICD-10-CM | POA: Diagnosis not present

## 2016-05-12 DIAGNOSIS — R200 Anesthesia of skin: Secondary | ICD-10-CM

## 2016-05-12 DIAGNOSIS — R202 Paresthesia of skin: Principal | ICD-10-CM

## 2016-05-12 DIAGNOSIS — M549 Dorsalgia, unspecified: Secondary | ICD-10-CM | POA: Diagnosis not present

## 2016-05-12 DIAGNOSIS — M5020 Other cervical disc displacement, unspecified cervical region: Secondary | ICD-10-CM | POA: Diagnosis not present

## 2016-05-12 NOTE — Telephone Encounter (Signed)
Patient of Natalie Yoder. Please advise

## 2016-05-12 NOTE — Telephone Encounter (Signed)
I believe she has had previous surgery on her neck. She will need referral to the neurosurgeon that took care of her in the past. Please confirm this with the patient.

## 2016-05-13 NOTE — Telephone Encounter (Signed)
Her neurosurgeon is no longer practicing in this area. She has a referral in to [facility], referral ordered 7/21, I can't see if there has been an appt scheduled with them yet. Eunice BlaseDebbie, can you tell if she has an appt there yet?

## 2016-05-13 NOTE — Telephone Encounter (Signed)
She had an appt at [facility] yesterday

## 2016-05-13 NOTE — Telephone Encounter (Signed)
Pt was [facility]  [place] [facility] [place] but needs referral to them for L arm numbness and pain also.  Referral entered in Epic

## 2016-05-13 NOTE — Telephone Encounter (Signed)
Please review and advise.

## 2016-05-13 NOTE — Telephone Encounter (Signed)
Please advise 

## 2016-05-20 ENCOUNTER — Ambulatory Visit (INDEPENDENT_AMBULATORY_CARE_PROVIDER_SITE_OTHER): Payer: Medicare Other | Admitting: Pediatrics

## 2016-05-20 ENCOUNTER — Encounter: Payer: Self-pay | Admitting: Pediatrics

## 2016-05-20 VITALS — BP 122/74 | HR 90 | Temp 97.2°F | Ht 66.0 in | Wt 236.0 lb

## 2016-05-20 DIAGNOSIS — Z794 Long term (current) use of insulin: Secondary | ICD-10-CM

## 2016-05-20 DIAGNOSIS — M4322 Fusion of spine, cervical region: Secondary | ICD-10-CM | POA: Diagnosis not present

## 2016-05-20 DIAGNOSIS — E1165 Type 2 diabetes mellitus with hyperglycemia: Secondary | ICD-10-CM

## 2016-05-20 DIAGNOSIS — M62830 Muscle spasm of back: Secondary | ICD-10-CM

## 2016-05-20 DIAGNOSIS — I10 Essential (primary) hypertension: Secondary | ICD-10-CM

## 2016-05-20 DIAGNOSIS — M545 Low back pain, unspecified: Secondary | ICD-10-CM

## 2016-05-20 DIAGNOSIS — IMO0002 Reserved for concepts with insufficient information to code with codable children: Secondary | ICD-10-CM

## 2016-05-20 DIAGNOSIS — E118 Type 2 diabetes mellitus with unspecified complications: Secondary | ICD-10-CM | POA: Diagnosis not present

## 2016-05-20 MED ORDER — INSULIN PEN NEEDLE 31G X 6 MM MISC: 1.0000 "pen " | Freq: Every day | 3 refills | Status: DC

## 2016-05-20 MED ORDER — CYCLOBENZAPRINE HCL 5 MG PO TABS: 5.0000 mg | ORAL_TABLET | Freq: Three times a day (TID) | ORAL | 0 refills | Status: DC | PRN

## 2016-05-20 NOTE — Patient Instructions (Signed)
Lantus: 8 units at night If morning glucose is regularly <100 let me know  Humalog (fast-acting): 5 units with meals as long as eating carbohydrates

## 2016-05-20 NOTE — Progress Notes (Signed)
    Subjective:    Patient ID: Natalie Yoder, female    DOB: 1976-06-23, 40 y.o.   MRN: [id number]  CC: Follow-up and Nausea   HPI: Natalie Yoder is a 40 y.o. female presenting for Follow-up and Nausea  DM2: AM BGLs 80s-120s Being very careful about what she eats Taking lantus 10u at night 8 units with meals Several lows before dinner to 50s  LBP: on gabapentin, helping some Starting PT Going to get MRI of spine  Has referral in for numbness/tingling in neck  Relevant past medical, surgical, family and social history reviewed. Interim medical history since our last visit reviewed. Allergies and medications reviewed and updated.  History  Smoking Status  . Never Smoker  Smokeless Tobacco  . Never Used    ROS: Per HPI      Objective:    BP 122/74   Pulse 90   Temp 97.2 F (36.2 C) (Oral)   Ht 5\' 6"  (1.676 m)   Wt 236 lb (107 kg)   LMP 04/02/2016   BMI 38.09 kg/m   Wt Readings from Last 3 Encounters:  05/20/16 236 lb (107 kg)  04/28/16 230 lb 12.8 oz (104.7 kg)  04/23/16 226 lb 3.2 oz (102.6 kg)     Gen: NAD, alert, cooperative with exam, NCAT EYES: EOMI, no conjunctival injection, or no icterus CV: NRRR, normal S1/S2, no murmur, distal pulses 2+ b/l Resp: CTABL, no wheezes, normal WOB Abd: +BS, soft, NTND. no guarding or organomegaly Ext: No edema, warm Neuro: Alert and oriented, hand grip 4+/5 L hand, 5/5 R han MSK: normal muscle bulk     Assessment & Plan:  Natalie Yoder was seen today for follow-up and nausea.  Diagnoses and all orders for this visit:  Essential hypertension Well controlled, off of meds now  Uncontrolled type 2 diabetes mellitus with complication, with long-term current use of insulin (HCC) A1c was 14 last visit Now with some lows Take 8 u lantus at night 5u humalog with meals as long as she is eating carbs with meal Cont avoiding sugar, carbs, sweet drinks as she has been -     Insulin Pen Needle (EXEL COMFORT POINT PEN NEEDLE)  31G X 6 MM MISC; 1 pen by Does not apply route daily.  Cervical vertebral fusion F/u neurosurgery Bilateral low back pain without sciatica F/u with neurosurgery, going to get MRI Morbid obesity, unspecified obesity type (HCC) Cont lifestyle changes Muscle spasm of back -     cyclobenzaprine (FLEXERIL) 5 MG tablet; Take 1 tablet (5 mg total) by mouth 3 (three) times daily as needed for muscle spasms.    Follow up plan: 4 weeks  Rex Krasarol Vincent, MD [facility] 05/20/2016, 2:04 PM

## 2016-05-25 ENCOUNTER — Ambulatory Visit: Payer: Medicare Other | Attending: Rehabilitation | Admitting: Physical Therapy

## 2016-05-25 DIAGNOSIS — M545 Low back pain: Secondary | ICD-10-CM | POA: Diagnosis not present

## 2016-05-25 NOTE — Therapy (Signed)
[facility] [address] [place], [place], [address] Phone: [phone]   Fax:  [phone]  Physical Therapy Evaluation  Patient Details  Name: Natalie Yoder MRN: [id number] Date of Birth: 1976-04-05 Referring Provider: Letta Kocher MD.  Encounter Date: 05/25/2016      PT End of Session - 05/25/16 1405    Visit Number 1   Number of Visits 12   Date for PT Re-Evaluation 07/20/16   PT Start Time 0145   PT Stop Time 0233   PT Time Calculation (min) 48 min   Activity Tolerance Patient tolerated treatment well   Behavior During Therapy [facility] for tasks assessed/performed      Past Medical History:  Diagnosis Date  . Diabetes mellitus without complication (HCC)    Type 2  . History of IBS   . Migraines     Past Surgical History:  Procedure Laterality Date  . SPINE SURGERY     2008, 2011, 2012    There were no vitals filed for this visit.       Subjective Assessment - 05/25/16 1408    Subjective I had a lumbar fusion in 2008 but my pain has returned and is worsening.  Patient reports an X-ray revealed a L4-5 spondylolthesis.  She reports pain across her low back into both hips and into right thigh.  She also had a cervical fusion in 2012 and is experiencing pain and numbness into her left UE.  She is seeing her doctor for this in September (2017).   Limitations Sitting;Walking;Standing   How long can you sit comfortably? 15 minutes.   How long can you stand comfortably? 10-15 minutes.   How long can you walk comfortably? Short distances.   Patient Stated Goals Get out of pain and be able to hold my 72 month old.   Currently in Pain? Yes   Pain Score 8    Pain Location Back   Pain Orientation Right;Left   Pain Descriptors / Indicators Aching;Sharp   Pain Type Chronic pain   Pain Onset More than a month ago   Pain Frequency Constant   Aggravating Factors  Certain movements.   Pain Relieving Factors Rest.             [facility] PT Assessment - 05/25/16 0001      Assessment   Medical Diagnosis L4-5 spondylolisthesis.   Referring Provider Letta Kocher MD.   Onset Date/Surgical Date --  Ongoing.     Precautions   Precautions --  L5-S1 fusion; L4-5 spondylolisthesis. No ext past neutral.     Restrictions   Weight Bearing Restrictions No     Balance Screen   Has the patient fallen in the past 6 months No   Has the patient had a decrease in activity level because of a fear of falling?  No   Is the patient reluctant to leave their home because of a fear of falling?  No     Home [employer]ist information centre manager residence     Prior Function   Level of Independence Independent     Posture/Postural Control   Posture/Postural Control No significant limitations     ROM / Strength   AROM / PROM / Strength AROM;Strength     AROM   Overall AROM Comments Deferred spianal ROM testing due to spondylolisthesis.     Strength   Overall Strength Comments Normal bilateral LE strength.     Palpation   Palpation comment tender diffusely bilaterally  from L4 to S1.     Special Tests    Special Tests --  Diminished bilateral Achilles reflexes.     Ambulation/Gait   Gait Comments WNL.                   [facility] Adult PT Treatment/Exercise - 05/25/16 0001      Modalities   Modalities Electrical Stimulation;Moist Heat     Moist Heat Therapy   Number Minutes Moist Heat 20 Minutes   Moist Heat Location --  Lumbar.     Programme researcher, broadcasting/film/videolectrical Stimulation   Electrical Stimulation Location --  Lumbar.   Electrical Stimulation Action IFC   Electrical Stimulation Parameters 80-150 Hz at 100% scan x 20 minutes.   Electrical Stimulation Goals Pain                  PT Short Term Goals - 05/25/16 1418      PT SHORT TERM GOAL #1   Title Independent with a HEP.   Time 3   Period Weeks   Status New           PT Long Term Goals - 05/25/16 1419      PT LONG TERM GOAL #1   Title Sit  30 minutes with pain not > 3/10.   Time 8   Period Weeks   Status New     PT LONG TERM GOAL #2   Title Stand 20 minutes with pain not > 3/10.   Time 8   Period Weeks   Status New     PT LONG TERM GOAL #3   Title Perform ADL's with pain not > 3/10.   Time 8   Period Weeks   Status New               Plan - 05/25/16 1420    Clinical Impression Statement The patient has worsenening low back pain with radiation into both hips and into right thigh.  She is diffusely tender to palpation from L4 to S1.  Many movements produce pain at levels of 8/10.  Sitting; standing and walking increase pain as well.   Rehab Potential Fair   PT Frequency 2x / week   PT Duration 6 weeks   PT Treatment/Interventions ADLs/Self Care Home Management;Electrical Stimulation;Moist Heat;Ultrasound;Patient/family education;Therapeutic activities;Therapeutic exercise;Manual techniques   PT Next Visit Plan Modalities to decrease pain and STW/M.  Low-level core exercises.  No extension past neutral.      Patient will benefit from skilled therapeutic intervention in order to improve the following deficits and impairments:  Pain, Decreased activity tolerance  Visit Diagnosis: Right low back pain, with sciatica presence unspecified - Plan: PT plan of care cert/re-cert      G-Codes - 05/25/16 1405    Functional Assessment Tool Used FOTO....72% limitation.   Functional Limitation Mobility: Walking and moving around   Mobility: Walking and Moving Around Current Status [phone]([id number]) At least 60 percent but less than 80 percent impaired, limited or restricted   Mobility: Walking and Moving Around Goal Status [phone](G8979) At least 20 percent but less than 40 percent impaired, limited or restricted       Problem List Patient Active Problem List   Diagnosis Date Noted  . Morbid obesity (HCC) 05/20/2016  . BMI 37.0-37.9, adult 04/29/2016  . Cervical vertebral fusion 04/23/2016  . Postpartum state 10/29/2015  . IBS  (irritable bowel syndrome) 01/25/2011  . Hypertension 01/25/2011  . Diabetes mellitus type 2 with complications, uncontrolled (HCC) 01/25/2011  .  Migraines 01/25/2011  . LBP (low back pain) 01/25/2011    Jayleena Stille, [place] MPT 05/25/2016, 2:34 PM  [facility] [address] [place], [place], [address] Phone: [phone]   Fax:  [phone]  Name: Natalie Yoder MRN: [id number] Date of Birth: [birth year]

## 2016-05-31 ENCOUNTER — Ambulatory Visit: Payer: Medicare Other | Admitting: *Deleted

## 2016-05-31 ENCOUNTER — Other Ambulatory Visit: Payer: Self-pay | Admitting: Rehabilitation

## 2016-05-31 DIAGNOSIS — M545 Low back pain: Secondary | ICD-10-CM | POA: Diagnosis not present

## 2016-05-31 DIAGNOSIS — M5126 Other intervertebral disc displacement, lumbar region: Secondary | ICD-10-CM

## 2016-05-31 NOTE — Therapy (Signed)
[facility] [address] [place], [place], [address] Phone: [phone]   Fax:  [phone]  Physical Therapy Treatment  Patient Details  Name: Natalie Yoder MRN: [id number] Date of Birth: 07/22/1976 Referring Provider: Letta Kocherhomas Saullo MD.  Encounter Date: 05/31/2016      PT End of Session - 05/31/16 1729    Visit Number 2   Number of Visits 12   Date for PT Re-Evaluation 07/20/16   PT Start Time 1300   PT Stop Time 1350   PT Time Calculation (min) 50 min      Past Medical History:  Diagnosis Date  . Diabetes mellitus without complication (HCC)    Type 2  . History of IBS   . Migraines     Past Surgical History:  Procedure Laterality Date  . SPINE SURGERY     2008, 2011, 2012    There were no vitals filed for this visit.      Subjective Assessment - 05/31/16 1304    Subjective I had a lumbar fusion in 2008 but my pain has returned and is worsening.  Patient reports an X-ray revealed a L4-5 spondylolthesis.  She reports pain across her low back into both hips and into right thigh.  She also had a cervical fusion in 2012 and is experiencing pain and numbness into her left UE.  She is seeing her doctor for this in September (2017).   Limitations Sitting;Walking;Standing   How long can you sit comfortably? 15 minutes.   How long can you stand comfortably? 10-15 minutes.   How long can you walk comfortably? Short distances.   Patient Stated Goals Get out of pain and be able to hold my 387 month old.   Currently in Pain? Yes   Pain Score 8    Pain Location Back   Pain Orientation Right;Left   Pain Descriptors / Indicators Aching   Pain Type Chronic pain   Pain Onset More than a month ago   Pain Frequency Constant                         [facility] Adult PT Treatment/Exercise - 05/31/16 0001      Exercises   Exercises Lumbar     Lumbar Exercises: Supine   Ab Set 15 reps;5 seconds  Draw-in core activation      Modalities   Modalities Electrical Stimulation;Moist Heat;Ultrasound     Moist Heat Therapy   Number Minutes Moist Heat 20 Minutes   Moist Heat Location --  Lumbar.     Programme researcher, broadcasting/film/videolectrical Stimulation   Electrical Stimulation Location Bil LB paras IFC x 15 mins 80-150 hz. with pton inclined plinth and Roll at knees   Electrical Stimulation Goals Pain     Ultrasound   Ultrasound Location Bil. LB paras 1.5 w/cm2 x 10 mins with pt prone over a pillow as perferred by Pt   Ultrasound Goals Pain     Manual Therapy   Manual Therapy Soft tissue mobilization   Soft tissue mobilization STW to Bil. LB paras in prone over a pillow                  PT Short Term Goals - 05/25/16 1418      PT SHORT TERM GOAL #1   Title Independent with a HEP.   Time 3   Period Weeks   Status New           PT Long Term Goals -  05/25/16 1419      PT LONG TERM GOAL #1   Title Sit 30 minutes with pain not > 3/10.   Time 8   Period Weeks   Status New     PT LONG TERM GOAL #2   Title Stand 20 minutes with pain not > 3/10.   Time 8   Period Weeks   Status New     PT LONG TERM GOAL #3   Title Perform ADL's with pain not > 3/10.   Time 8   Period Weeks   Status New               Plan - 05/31/16 1730    Clinical Impression Statement Pt did fair with Rx today. She is in constant pain with pain radiating down her LE being her CC. She was able to tolerate [place], some STW and heat and Estim today with some relief. She needs to be reclined at a  45 degree angle and a roll under both knees during Estim and heat to be comfortable. She preferred the prone position to sidelying during [place] and STW.   Rehab Potential Fair   PT Frequency 2x / week   PT Duration 6 weeks   PT Treatment/Interventions ADLs/Self Care Home Management;Electrical Stimulation;Moist Heat;Ultrasound;Patient/family education;Therapeutic activities;Therapeutic exercise;Manual techniques   PT Next Visit Plan Modalities to  decrease pain and STW/M.  Low-level core exercises.  No extension past neutral.   Consulted and Agree with Plan of Care Patient      Patient will benefit from skilled therapeutic intervention in order to improve the following deficits and impairments:  Pain, Decreased activity tolerance  Visit Diagnosis: Right low back pain, with sciatica presence unspecified     Problem List Patient Active Problem List   Diagnosis Date Noted  . Morbid obesity (HCC) 05/20/2016  . BMI 37.0-37.9, adult 04/29/2016  . Cervical vertebral fusion 04/23/2016  . Postpartum state 10/29/2015  . IBS (irritable bowel syndrome) 01/25/2011  . Hypertension 01/25/2011  . Diabetes mellitus type 2 with complications, uncontrolled (HCC) 01/25/2011  . Migraines 01/25/2011  . LBP (low back pain) 01/25/2011    Anaih Brander,CHRIS, PTA 05/31/2016, 5:37 PM  [facility] [address] [place], [place], [address] Phone: [phone]   Fax:  [phone]  Name: Natalie Yoder MRN: [id number] Date of Birth: 1976/01/18

## 2016-05-31 NOTE — Patient Instructions (Signed)

## 2016-06-01 ENCOUNTER — Encounter: Payer: Self-pay | Admitting: Physical Therapy

## 2016-06-01 ENCOUNTER — Ambulatory Visit: Payer: Medicare Other | Admitting: Physical Therapy

## 2016-06-01 DIAGNOSIS — M545 Low back pain: Secondary | ICD-10-CM

## 2016-06-01 NOTE — Therapy (Signed)
[facility] [address] [place], [place], [address] Phone: [phone]   Fax:  [phone]  Physical Therapy Treatment  Patient Details  Name: Natalie Yoder MRN: [id number] Date of Birth: 03-01-1976 Referring Provider: Letta Kocher MD.  Encounter Date: 06/01/2016      PT End of Session - 06/01/16 1302    Visit Number 3   Number of Visits 12   Date for PT Re-Evaluation 07/20/16   PT Start Time 1302   PT Stop Time 1341   PT Time Calculation (min) 39 min   Activity Tolerance Patient tolerated treatment well   Behavior During Therapy [facility] for tasks assessed/performed      Past Medical History:  Diagnosis Date  . Diabetes mellitus without complication (HCC)    Type 2  . History of IBS   . Migraines     Past Surgical History:  Procedure Laterality Date  . SPINE SURGERY     2008, 2011, 2012    There were no vitals filed for this visit.      Subjective Assessment - 06/01/16 1257    Subjective Has MRI 06/11/2016 and had increased pain last night with tightness in B legs and feet.   Limitations Sitting;Walking;Standing   How long can you sit comfortably? 15 minutes.   How long can you stand comfortably? 10-15 minutes.   How long can you walk comfortably? Short distances.   Patient Stated Goals Get out of pain and be able to hold my 59 month old.   Currently in Pain? Yes   Pain Score 5    Pain Location Back   Pain Orientation Right;Left;Lower   Pain Descriptors / Indicators Aching;Burning   Pain Type Chronic pain   Pain Onset More than a month ago   Pain Frequency Constant            [facility] PT Assessment - 06/01/16 0001      Assessment   Medical Diagnosis L4-5 spondylolisthesis.     Restrictions   Weight Bearing Restrictions No                     [facility] Adult PT Treatment/Exercise - 06/01/16 0001      Modalities   Modalities Electrical Stimulation;Moist Heat;Ultrasound     Moist Heat Therapy    Number Minutes Moist Heat 15 Minutes   Moist Heat Location Lumbar Spine     Electrical Stimulation   Electrical Stimulation Location B lumbar paraspinals   Electrical Stimulation Action IFC   Electrical Stimulation Parameters 1-10 hz x15 min   Electrical Stimulation Goals Pain     Ultrasound   Ultrasound Location B lumbar paraspinals   Ultrasound Parameters 1.5 w/cm2, 100%, 1 mhz x10 min  in prone over 2 pillows   Ultrasound Goals Pain     Manual Therapy   Manual Therapy Soft tissue mobilization   Soft tissue mobilization STW to B lumbar paraspinals/ QL to decrease tightness and pain  in prone over 2 pillows                  PT Short Term Goals - 06/01/16 1329      PT SHORT TERM GOAL #1   Title Independent with a HEP.   Time 3   Period Weeks   Status Achieved           PT Long Term Goals - 05/25/16 1419      PT LONG TERM GOAL #1   Title Sit 30  minutes with pain not > 3/10.   Time 8   Period Weeks   Status New     PT LONG TERM GOAL #2   Title Stand 20 minutes with pain not > 3/10.   Time 8   Period Weeks   Status New     PT LONG TERM GOAL #3   Title Perform ADL's with pain not > 3/10.   Time 8   Period Weeks   Status New               Plan - 06/01/16 1333    Clinical Impression Statement Patient tolerated today's treatment fairly well as patient arrived with mid level B low back pain today. US and STW completed in prone over pillows per previous treatment instructions. Patient experienced pain down RLE with manual therapy over R lumbar paraspinals and QL. As manual therapy progressed patient reported experiencing RLE throbbing and manual therapy was discontinued to allow patient to reposition herself. Electrical stimulation and moist heat completed in reclined position with wedge for LE support with normal response. Goals remain on-going at this time secondary to pain experienced and patient reported compliance with HEP. Patient experienced  decreased intensity of RLE throbbing following end of PT and said she would take pain medication following today's treatment.   Rehab Potential Fair   PT Frequency 2x / week   PT Duration 6 weeks   PT Treatment/Interventions ADLs/Self Care Home Management;Electrical Stimulation;Moist Heat;Ultrasound;Patient/family education;Therapeutic activities;Therapeutic exercise;Manual techniques   PT Next Visit Plan Modalities to decrease pain and STW/M.  Low-level core exercises.  No extension past neutral.   Consulted and Agree with Plan of Care Patient      Patient will benefit from skilled therapeutic intervention in order to improve the following deficits and impairments:  Pain, Decreased activity tolerance  Visit Diagnosis: Right low back pain, with sciatica presence unspecified     Problem List Patient Active Problem List   Diagnosis Date Noted  . Morbid obesity (HCC) 05/20/2016  . BMI 37.0-37.9, adult 04/29/2016  . Cervical vertebral fusion 04/23/2016  . Postpartum state 10/29/2015  . IBS (irritable bowel syndrome) 01/25/2011  . Hypertension 01/25/2011  . Diabetes mellitus type 2 with complications, uncontrolled (HCC) 01/25/2011  . Migraines 01/25/2011  . LBP (low back pain) 01/25/2011    Evelene CroonKelsey M Parsons, PTA 06/01/2016, 1:48 PM  [facility] [address] [place], [place], [address] Phone: [phone]   Fax:  [phone]  Name: Natalie Yoder MRN: [id number] Date of Birth: 1976/07/22

## 2016-06-08 ENCOUNTER — Ambulatory Visit: Payer: Medicare Other | Admitting: *Deleted

## 2016-06-10 ENCOUNTER — Ambulatory Visit: Payer: Medicare Other | Attending: Rehabilitation | Admitting: *Deleted

## 2016-06-10 DIAGNOSIS — M545 Low back pain: Secondary | ICD-10-CM | POA: Insufficient documentation

## 2016-06-10 NOTE — Therapy (Addendum)
[facility] [place], [place], [address] Phone: [phone]   Fax:  [phone]  Physical Therapy Treatment  Patient Details  Name: Natalie Yoder MRN: [id number] Date of Birth: [birth year] Referring Provider: Zonia Kief MD.  Encounter Date: 06/10/2016      PT End of Session - 06/10/16 1202    Visit Number 4   Number of Visits 12   Date for PT Re-Evaluation 07/20/16   PT Start Time 1115   PT Stop Time 8144  Rx shortened as per Pt   PT Time Calculation (min) 40 min      Past Medical History:  Diagnosis Date  . Diabetes mellitus without complication (HCC)    Type 2  . History of IBS   . Migraines     Past Surgical History:  Procedure Laterality Date  . SPINE SURGERY     2008, 2011, 2012    There were no vitals filed for this visit.      Subjective Assessment - 06/10/16 1121    Subjective Has MRI 06/11/2016 and had increased pain last night with tightness in B legs and feet.   My neck and LT arm are killing me also   Limitations Sitting;Walking;Standing   How long can you sit comfortably? 15 minutes.   How long can you stand comfortably? 10-15 minutes.   How long can you walk comfortably? Short distances.   Patient Stated Goals Get out of pain and be able to hold my 69 month old.   Currently in Pain? Yes   Pain Score 5    Pain Location Back   Pain Orientation Right;Left;Lower   Pain Descriptors / Indicators Aching   Pain Onset More than a month ago                         [facility] Adult PT Treatment/Exercise - 06/10/16 0001      Modalities   Modalities Electrical Stimulation;Moist Heat;Ultrasound     Moist Heat Therapy   Number Minutes Moist Heat 15 Minutes   Moist Heat Location Lumbar Spine     Electrical Stimulation   Electrical Stimulation Location Bil LB paras IFC x 15 mins 80-150 hz. with pton inclined plinth and Roll at knees   Electrical Stimulation Goals Pain     Ultrasound    Ultrasound Location Bil lumbar paras   Ultrasound Parameters 1.5 w/cm2 x 15 mins total   Ultrasound Goals Pain                  PT Short Term Goals - 06/01/16 1329      PT SHORT TERM GOAL #1   Title Independent with a HEP.   Time 3   Period Weeks   Status Achieved           PT Long Term Goals - 05/25/16 1419      PT LONG TERM GOAL #1   Title Sit 30 minutes with pain not > 3/10.   Time 8   Period Weeks   Status New     PT LONG TERM GOAL #2   Title Stand 20 minutes with pain not > 3/10.   Time 8   Period Weeks   Status New     PT LONG TERM GOAL #3   Title Perform ADL's with pain not > 3/10.   Time 8   Period Weeks   Status New  Plan - 06/10/16 1202    Clinical Impression Statement Pt did fair with Rx today. She continues to have shooting pain down her RT LE in the front and in the back. She is having an MRI on Saturday for her LB and requested just [place], heat and Estim today and await MRI results. No New goals were met due to pain. She also continues to have neck and UE pain   Rehab Potential Fair   PT Frequency 2x / week   PT Duration 6 weeks   PT Treatment/Interventions ADLs/Self Care Home Management;Electrical Stimulation;Moist Heat;Ultrasound;Patient/family education;Therapeutic activities;Therapeutic exercise;Manual techniques   PT Next Visit Plan Modalities to decrease pain and STW/M.  Low-level core exercises.  No extension past neutral.   Consulted and Agree with Plan of Care Patient      Patient will benefit from skilled therapeutic intervention in order to improve the following deficits and impairments:  Pain, Decreased activity tolerance  Visit Diagnosis: Right low back pain, with sciatica presence unspecified     Problem List Patient Active Problem List   Diagnosis Date Noted  . Morbid obesity ([place]) 05/20/2016  . BMI 37.0-37.9, adult 04/29/2016  . Cervical vertebral fusion 04/23/2016  . Postpartum state  10/29/2015  . IBS (irritable bowel syndrome) 01/25/2011  . Hypertension 01/25/2011  . Diabetes mellitus type 2 with complications, uncontrolled ([place]) 01/25/2011  . Migraines 01/25/2011  . LBP (low back pain) 01/25/2011    RAMSEUR,CHRIS, PTA 06/10/2016, 3:54 PM  [facility] [place], [place], [address] Phone: [phone]   Fax:  [phone]  Name: Natalie Yoder MRN: [id number] Date of Birth: 04-18-1976  PHYSICAL THERAPY DISCHARGE SUMMARY  Visits from Start of Care: 4.  Current functional level related to goals / functional outcomes: See above.   Remaining deficits: Continued pain.   Education / Equipment: HEP. Plan: Patient agrees to discharge.  Patient goals were not met. Patient is being discharged due to not returning since the last visit.  ?????         Mali Applegate MPT

## 2016-06-11 ENCOUNTER — Ambulatory Visit
Admission: RE | Admit: 2016-06-11 | Discharge: 2016-06-11 | Disposition: A | Discharge status: Home / Self Care | Payer: Medicare Other | Source: Ambulatory Visit | Attending: Rehabilitation | Admitting: Rehabilitation

## 2016-06-11 DIAGNOSIS — M5126 Other intervertebral disc displacement, lumbar region: Secondary | ICD-10-CM | POA: Diagnosis not present

## 2016-06-11 MED ORDER — GADOBENATE DIMEGLUMINE 529 MG/ML IV SOLN: 20.0000 mL | Freq: Once | INTRAVENOUS | Status: DC | PRN

## 2016-06-16 DIAGNOSIS — M5020 Other cervical disc displacement, unspecified cervical region: Secondary | ICD-10-CM | POA: Diagnosis not present

## 2016-06-16 DIAGNOSIS — M5126 Other intervertebral disc displacement, lumbar region: Secondary | ICD-10-CM | POA: Diagnosis not present

## 2016-06-17 ENCOUNTER — Encounter: Payer: Self-pay | Admitting: *Deleted

## 2016-06-18 ENCOUNTER — Ambulatory Visit (INDEPENDENT_AMBULATORY_CARE_PROVIDER_SITE_OTHER): Payer: Medicare Other | Admitting: Pediatrics

## 2016-06-18 ENCOUNTER — Encounter: Payer: Self-pay | Admitting: Pediatrics

## 2016-06-18 VITALS — BP 136/87 | HR 91 | Temp 97.2°F | Ht 66.0 in | Wt 241.6 lb

## 2016-06-18 DIAGNOSIS — M4322 Fusion of spine, cervical region: Secondary | ICD-10-CM

## 2016-06-18 DIAGNOSIS — Z794 Long term (current) use of insulin: Secondary | ICD-10-CM | POA: Diagnosis not present

## 2016-06-18 DIAGNOSIS — I10 Essential (primary) hypertension: Secondary | ICD-10-CM

## 2016-06-18 DIAGNOSIS — E118 Type 2 diabetes mellitus with unspecified complications: Secondary | ICD-10-CM

## 2016-06-18 DIAGNOSIS — G43809 Other migraine, not intractable, without status migrainosus: Secondary | ICD-10-CM | POA: Diagnosis not present

## 2016-06-18 DIAGNOSIS — IMO0002 Reserved for concepts with insufficient information to code with codable children: Secondary | ICD-10-CM

## 2016-06-18 DIAGNOSIS — E1165 Type 2 diabetes mellitus with hyperglycemia: Secondary | ICD-10-CM

## 2016-06-18 MED ORDER — EMPAGLIFLOZIN 10 MG PO TABS: 10.0000 mg | ORAL_TABLET | Freq: Every day | ORAL | 1 refills | Status: DC

## 2016-06-18 MED ORDER — METFORMIN HCL 500 MG PO TABS: 500.0000 mg | ORAL_TABLET | Freq: Two times a day (BID) | ORAL | 3 refills | Status: DC

## 2016-06-18 NOTE — Patient Instructions (Signed)
Start metformin twice a day Start jardiance once a day Let me know if [place] Pepperjardiance is not covered by your insurance and we can switch to something else.

## 2016-06-18 NOTE — Progress Notes (Signed)
  Subjective:   Patient ID: Natalie Yoder, female    DOB: [birth year], 40 y.o.   MRN: [id number] CC: Follow-up DM2 HPI: Natalie Yoder is a 40 y.o. female presenting for Follow-up  Started steroid taper yesterday for back and neck pain, started with 60mg , decreasing by 10 next 6 days  DM2: no higher than 160-170 AM BGLs 79-120s Having a couple of lows into the 50s Last night had one about 10pm Watching what she is eating, cut out sweet tea  Migraines:  Not taking anything for them now Thinks that her neck pain is making it worse Doesn't want to take anything for migraines now  Neck pain and L leg pain:  switched to lyrica, some improvement  Relevant past medical, surgical, family and social history reviewed. Allergies and medications reviewed and updated. History  Smoking Status  . Never Smoker  Smokeless Tobacco  . Never Used   ROS: Per HPI   Objective:    BP 136/87   Pulse 91   Temp 97.2 F (36.2 C) (Oral)   Ht 5\' 6"  (1.676 m)   Wt 241 lb 9.6 oz (109.6 kg)   BMI 39.00 kg/m   Wt Readings from Last 3 Encounters:  06/18/16 241 lb 9.6 oz (109.6 kg)  05/20/16 236 lb (107 kg)  04/28/16 230 lb 12.8 oz (104.7 kg)    Gen: NAD, alert, cooperative with exam, NCAT EYES: EOMI, no conjunctival injection, or no icterus CV: NRRR, normal S1/S2, no murmur, distal pulses 2+ b/l Resp: CTABL, no wheezes, normal WOB Abd: +BS, soft, NTND. no guarding or organomegaly Ext: No edema, warm Neuro: Alert and oriented Assessment & Plan:  Natalie Yoder was seen today for follow-up multiple med problems  Diagnoses and all orders for this visit:  Essential hypertension Slightly elevated today Not on medications Cont to follow  Other migraine without status migrainosus, not intractable OTC prns for now Pt thinks neck pain contributing, hoping steroid taper pack going to help neck pain per ortho Will let me know if migraines worsens  Uncontrolled type 2 diabetes mellitus with complication,  with long-term current use of insulin (HCC) Stop insulin Having  On minimal insulin therapy Repeat A1c next visit Start below -     metFORMIN (GLUCOPHAGE) 500 MG tablet; Take 1 tablet (500 mg total) by mouth 2 (two) times daily with a meal. -     empagliflozin (JARDIANCE) 10 MG TABS tablet; Take 10 mg by mouth daily.  Cervical vertebral fusion Numbness, tingling L arm comes and goes Following with ortho, steroid taper as above  Morbid obesity, unspecified obesity type (HCC) Cont lifestyle changes, avoiding sugar  Follow up plan: Return in about 6 weeks (around 07/30/2016). Rex Krasarol Carman Essick, MD [facility]

## 2016-06-24 ENCOUNTER — Other Ambulatory Visit: Payer: Self-pay | Admitting: Rehabilitation

## 2016-06-26 ENCOUNTER — Other Ambulatory Visit: Payer: Self-pay | Admitting: Rehabilitation

## 2016-06-26 DIAGNOSIS — M5020 Other cervical disc displacement, unspecified cervical region: Secondary | ICD-10-CM

## 2016-07-06 DIAGNOSIS — M5126 Other intervertebral disc displacement, lumbar region: Secondary | ICD-10-CM | POA: Diagnosis not present

## 2016-07-07 ENCOUNTER — Ambulatory Visit
Admission: RE | Admit: 2016-07-07 | Discharge: 2016-07-07 | Disposition: A | Discharge status: Home / Self Care | Payer: Medicare Other | Source: Ambulatory Visit | Attending: Rehabilitation | Admitting: Rehabilitation

## 2016-07-07 DIAGNOSIS — M4802 Spinal stenosis, cervical region: Secondary | ICD-10-CM | POA: Diagnosis not present

## 2016-07-07 DIAGNOSIS — M5020 Other cervical disc displacement, unspecified cervical region: Secondary | ICD-10-CM

## 2016-07-07 MED ORDER — GADOBENATE DIMEGLUMINE 529 MG/ML IV SOLN
20.0000 mL | Freq: Once | INTRAVENOUS | Status: AC | PRN
  Administered 2016-07-07: 20 mL via INTRAVENOUS

## 2016-07-20 DIAGNOSIS — M5126 Other intervertebral disc displacement, lumbar region: Secondary | ICD-10-CM | POA: Diagnosis not present

## 2016-07-20 DIAGNOSIS — M461 Sacroiliitis, not elsewhere classified: Secondary | ICD-10-CM | POA: Diagnosis not present

## 2016-07-20 DIAGNOSIS — M5020 Other cervical disc displacement, unspecified cervical region: Secondary | ICD-10-CM | POA: Diagnosis not present

## 2016-07-28 DIAGNOSIS — M461 Sacroiliitis, not elsewhere classified: Secondary | ICD-10-CM | POA: Diagnosis not present

## 2016-07-29 NOTE — Therapy (Signed)
Patient arrived but was in a lot of pain. Arrived but no carge

## 2016-07-30 ENCOUNTER — Ambulatory Visit (INDEPENDENT_AMBULATORY_CARE_PROVIDER_SITE_OTHER): Payer: Medicare Other | Admitting: Pediatrics

## 2016-07-30 ENCOUNTER — Encounter: Payer: Self-pay | Admitting: Pediatrics

## 2016-07-30 VITALS — BP 137/78 | HR 78 | Temp 97.9°F | Ht 66.0 in | Wt 238.8 lb

## 2016-07-30 DIAGNOSIS — M545 Low back pain, unspecified: Secondary | ICD-10-CM

## 2016-07-30 DIAGNOSIS — E1165 Type 2 diabetes mellitus with hyperglycemia: Secondary | ICD-10-CM

## 2016-07-30 DIAGNOSIS — E118 Type 2 diabetes mellitus with unspecified complications: Secondary | ICD-10-CM

## 2016-07-30 DIAGNOSIS — I10 Essential (primary) hypertension: Secondary | ICD-10-CM | POA: Diagnosis not present

## 2016-07-30 DIAGNOSIS — Z794 Long term (current) use of insulin: Secondary | ICD-10-CM | POA: Diagnosis not present

## 2016-07-30 DIAGNOSIS — G8929 Other chronic pain: Secondary | ICD-10-CM | POA: Diagnosis not present

## 2016-07-30 DIAGNOSIS — IMO0002 Reserved for concepts with insufficient information to code with codable children: Secondary | ICD-10-CM

## 2016-07-30 LAB — BAYER DCA HB A1C WAIVED: HB A1C (BAYER DCA - WAIVED): 6.9 % (ref <7.0)

## 2016-07-30 NOTE — Progress Notes (Signed)
  Subjective:   Patient ID: Natalie Yoder, female    DOB: 1976-08-19, 40 y.o.   MRN: [id number] CC: Follow-up DM2  HPI: Natalie Yoder is a 40 y.o. female presenting for Follow-up  Chronic back pain: On steroids again Received steroid joint injection in her back 2 days ago Taking ibuprofen regularly  DM2: BGLs 60 lowest in the morning A couple lows in 60s, feels jittery when it happens Highest in the mornin gup to 120s Dinner BGls: 150s-170s Took SSI when she was on steroid pills Due for eye exam  Elevated BMI Continues to avoid sugar Chronic pain limits exercise/activity  Relevant past medical, surgical, family and social history reviewed. Allergies and medications reviewed and updated. History  Smoking Status  . Never Smoker  Smokeless Tobacco  . Never Used   ROS: Per HPI   Objective:    BP 137/78   Pulse 78   Temp 97.9 F (36.6 C) (Oral)   Ht _0  (1.676 m)   Wt 238 lb 12.8 oz (108.3 kg)   BMI 38.54 kg/m   Wt Readings from Last 3 Encounters:  07/30/16 238 lb 12.8 oz (108.3 kg)  06/18/16 241 lb 9.6 oz (109.6 kg)  05/20/16 236 lb (107 kg)    Gen: NAD, alert, cooperative with exam, NCAT EYES: EOMI, no conjunctival injection, or no icterus LYMPH: no cervical LAD CV: NRRR, normal S1/S2, no murmur, distal pulses 2+ b/l Resp: CTABL, no wheezes, normal WOB Abd: +BS, soft, NTND. no guarding or organomegaly Ext: No edema, warm Neuro: Alert and oriented MSK: normal muscle bulk  Assessment & Plan:  Natalie Yoder was seen today for follow-up multiple med problems  Diagnoses and all orders for this visit:  Essential hypertension Borderline BP H/o Dm2, now on high dose NSAIDs, recheck BMP -     BMP8+EGFR  Uncontrolled type 2 diabetes mellitus with complication, with long-term current use of insulin (HCC) Last A1c >14 Recheck today 6.9 Now off of insulin other than when elevated over 200 from steroids -     Bayer DCA Hb A1c Waived  Chronic bilateral low back pain  without sciatica Followed at [facility] Getting steroid injections in back On cymbalta, takes flexeril at night Pain picking up 9 mo at home, has assistance  Morbid obesity ([place]) Cont lifestyle changes, increase fruits/veg, decrease sugary foods  Follow up plan: 3 mo Assunta Found, MD [place]

## 2016-07-31 LAB — BMP8+EGFR
BUN/Creatinine Ratio: 22 (ref 9–23)
BUN: 16 mg/dL (ref 6–24)
CALCIUM: 9.8 mg/dL (ref 8.7–10.2)
CO2: 24 mmol/L (ref 18–29)
Chloride: 102 mmol/L (ref 96–106)
Creatinine, Ser: 0.74 mg/dL (ref 0.57–1.00)
GFR calc Af Amer: 117 mL/min/{1.73_m2} (ref >59)
GFR, EST NON AFRICAN AMERICAN: 102 mL/min/{1.73_m2} (ref >59)
Glucose: 44 mg/dL — ABNORMAL LOW (ref 65–99)
POTASSIUM: 3.5 mmol/L (ref 3.5–5.2)
Sodium: 141 mmol/L (ref 134–144)

## 2016-08-04 ENCOUNTER — Ambulatory Visit (INDEPENDENT_AMBULATORY_CARE_PROVIDER_SITE_OTHER): Payer: Medicare Other | Admitting: Family Medicine

## 2016-08-04 ENCOUNTER — Encounter: Payer: Self-pay | Admitting: Family Medicine

## 2016-08-04 VITALS — BP 145/87 | HR 73 | Temp 96.9°F | Ht 66.0 in | Wt 238.0 lb

## 2016-08-04 DIAGNOSIS — M25511 Pain in right shoulder: Secondary | ICD-10-CM | POA: Diagnosis not present

## 2016-08-04 DIAGNOSIS — R0789 Other chest pain: Secondary | ICD-10-CM | POA: Diagnosis not present

## 2016-08-04 NOTE — Progress Notes (Signed)
Subjective:    Patient ID: Natalie Yoder, female    DOB: [birth year], 40 y.o.   MRN: [id number]  HPI 40 year old female who is here today with upper right chest and shoulder and back pain. She also has a headache shortness of breath. Symptoms started this morning. There was no prior injury to the shoulder area. She has no cough. She had does have diabetes. There is no history of heart disease. There is a fairly extensive history of neck surgery with prior disc disease as well as injections in her lower back for what sounds like disc disease as well. Symptoms do not really fit together very well and neurological fashion.  Patient Active Problem List   Diagnosis Date Noted  . Morbid obesity (HCC) 05/20/2016  . BMI 37.0-37.9, adult 04/29/2016  . Cervical vertebral fusion 04/23/2016  . Postpartum state 10/29/2015  . IBS (irritable bowel syndrome) 01/25/2011  . Hypertension 01/25/2011  . Diabetes mellitus type 2 with complications, uncontrolled (HCC) 01/25/2011  . Migraines 01/25/2011  . Low back pain 01/25/2011   Outpatient Encounter Prescriptions as of 08/04/2016  Medication Sig  . albuterol (PROVENTIL HFA;VENTOLIN HFA) 108 (90 BASE) MCG/ACT inhaler Inhale 2 puffs into the lungs every 4 (four) hours as needed for wheezing or shortness of breath (cough, shortness of breath or wheezing.).  Marland Kitchen. cyclobenzaprine (FLEXERIL) 5 MG tablet   . diazepam (VALIUM) 10 MG tablet   . DULoxetine (CYMBALTA) 30 MG capsule   . empagliflozin (JARDIANCE) 10 MG TABS tablet Take 10 mg by mouth daily.  Marland Kitchen. glucose blood (ONE TOUCH ULTRA TEST) test strip Use as instructed  . ibuprofen (ADVIL,MOTRIN) 800 MG tablet Take 1 tablet (800 mg total) by mouth 3 (three) times daily.  . Lancets (ONETOUCH ULTRASOFT) lancets Use as instructed  . metFORMIN (GLUCOPHAGE) 500 MG tablet Take 1 tablet (500 mg total) by mouth 2 (two) times daily with a meal.  . Prenatal Vit-Fe Fumarate-FA (PRENATAL MULTIVITAMIN) TABS tablet Take 1 tablet  by mouth at bedtime. Reported on 04/23/2016  . traMADol (ULTRAM) 50 MG tablet    No facility-administered encounter medications on file as of 08/04/2016.       Review of Systems  Constitutional: Negative.   Respiratory: Positive for chest tightness and shortness of breath.   Cardiovascular: Positive for chest pain.  Musculoskeletal: Positive for arthralgias.  Neurological: Negative.   Psychiatric/Behavioral: Negative.        Objective:   Physical Exam  Constitutional: She appears well-developed and well-nourished.  Cardiovascular: Normal rate, regular rhythm and normal heart sounds.   Pulmonary/Chest: Effort normal and breath sounds normal.  Musculoskeletal:  Right shoulder: Normal range of motion No tenderness to palpation There is however some tenderness around the right scapula and what I would call a trigger point. After an EKG was done which was normal this trigger point was injected with half cc of Marcaine and half cc of Kenalog.   BP (!) 145/87   Pulse 73   Temp (!) 96.9 F (36.1 C) (Oral)   Ht 5\' 6"  (1.676 m)   Wt 238 lb (108 kg)   BMI 38.41 kg/m         Assessment & Plan:  1. Chest discomfort EKG is normal. This was obtained since she is a female and has diabetes there was some thought that a typical symptoms can be heart related. Again is hard to put all these symptoms together but it seemed to relate to a tender area on the right posterior shoulder  back that was injected. Hopefully this will provide some relief of symptoms  Frederica KusterStephen M Miller MD - EKG 12-Lead

## 2016-08-07 ENCOUNTER — Ambulatory Visit (INDEPENDENT_AMBULATORY_CARE_PROVIDER_SITE_OTHER): Payer: Medicare Other | Admitting: Nurse Practitioner

## 2016-08-07 ENCOUNTER — Encounter: Payer: Self-pay | Admitting: Nurse Practitioner

## 2016-08-07 VITALS — BP 125/75 | HR 91 | Temp 97.8°F | Ht 66.0 in | Wt 238.4 lb

## 2016-08-07 DIAGNOSIS — M791 Myalgia, unspecified site: Secondary | ICD-10-CM

## 2016-08-07 MED ORDER — KETOROLAC TROMETHAMINE 60 MG/2ML IM SOLN
60.0000 mg | Freq: Once | INTRAMUSCULAR | Status: AC
  Administered 2016-08-07: 60 mg via INTRAMUSCULAR

## 2016-08-07 MED ORDER — CYCLOBENZAPRINE HCL 10 MG PO TABS: 10.0000 mg | ORAL_TABLET | Freq: Three times a day (TID) | ORAL | 0 refills | Status: DC | PRN

## 2016-08-07 NOTE — Patient Instructions (Signed)
Muscle Pain, Adult  Muscle pain (myalgia) may be caused by many things, including:  · Overuse or muscle strain, especially if you are not in shape. This is the most common cause of muscle pain.  · Injury.  · Bruises.  · Viruses, such as the flu.  · Infectious diseases.  · Fibromyalgia, which is a chronic condition that causes muscle tenderness, fatigue, and headache.  · Autoimmune diseases, including lupus.  · Certain drugs, including ACE inhibitors and statins.  Muscle pain may be mild or severe. In most cases, the pain lasts only a short time and goes away without treatment. To diagnose the cause of your muscle pain, your health care provider will take your medical history. This means he or she will ask you when your muscle pain began and what has been happening. If you have not had muscle pain for very long, your health care provider may want to wait before doing much testing. If your muscle pain has lasted a long time, your health care provider may want to run tests right away. If your health care provider thinks your muscle pain may be caused by illness, you may need to have additional tests to rule out certain conditions.   Treatment for muscle pain depends on the cause. Home care is often enough to relieve muscle pain. Your health care provider may also prescribe anti-inflammatory medicine.  HOME CARE INSTRUCTIONS  Watch your condition for any changes. The following actions may help to lessen any discomfort you are feeling:  · Only take over-the-counter or prescription medicines as directed by your health care provider.  · Apply ice to the sore muscle:    Put ice in a plastic bag.    Place a towel between your skin and the bag.    Leave the ice on for 15-20 minutes, 3-4 times a day.  · You may alternate applying hot and cold packs to the muscle as directed by your health care provider.  · If overuse is causing your muscle pain, slow down your activities until the pain goes away.    Remember that it is normal  to feel some muscle pain after starting a workout program. Muscles that have not been used often will be sore at first.    Do regular, gentle exercises if you are not usually active.    Warm up before exercising to lower your risk of muscle pain.  · Do not continue working out if the pain is very bad. Bad pain could mean you have injured a muscle.  SEEK MEDICAL CARE IF:  · Your muscle pain gets worse, and medicines do not help.  · You have muscle pain that lasts longer than 3 days.  · You have a rash or fever along with muscle pain.  · You have muscle pain after a tick bite.  · You have muscle pain while working out, even though you are in good physical condition.  · You have redness, soreness, or swelling along with muscle pain.  · You have muscle pain after starting a new medicine or changing the dose of a medicine.  SEEK IMMEDIATE MEDICAL CARE IF:  · You have trouble breathing.  · You have trouble swallowing.  · You have muscle pain along with a stiff neck, fever, and vomiting.  · You have severe muscle weakness or cannot move part of your body.  MAKE SURE YOU:   · Understand these instructions.  · Will watch your condition.  · Will get   help right away if you are not doing well or get worse.     This information is not intended to replace advice given to you by your health care provider. Make sure you discuss any questions you have with your health care provider.     Document Released: 08/12/2006 Document Revised: 10/11/2014 Document Reviewed: 07/17/2013  Elsevier Interactive Patient Education ©2016 [employer]

## 2016-08-07 NOTE — Progress Notes (Signed)
   Subjective:    Patient ID: Natalie Yoder, female    DOB: 1976/06/04, 40 y.o.   MRN: [id number]  HPI Patient is in c/o chest pain that radiates to her back. Started earlier in the week. Saw Dr. Hyacinth MeekerMiller earlier in the week , EKG negative and dx her with a painful triger point. He gave her a steroid injection- no relief. Now pain has gotten worse. All the way across chest and into back- hurts to cough or deep breathe slight SOB- does n ot inhale deeply because it hurts.She has episodic bouts of pain in back and neck for many years- pain feels similar.   Review of Systems  Constitutional: Negative.  Negative for fatigue and fever.  HENT: Negative.   Respiratory: Positive for shortness of breath (mild).   Cardiovascular: Positive for chest pain. Negative for palpitations and leg swelling.  Genitourinary: Negative.   Musculoskeletal: Negative.        Pain acroos chest- worse with movement  Neurological: Negative.   Psychiatric/Behavioral: Negative.   All other systems reviewed and are negative.      Objective:   Physical Exam  Constitutional: She is oriented to person, place, and time. She appears well-developed and well-nourished. She appears distressed.  Cardiovascular: Normal rate, regular rhythm and normal heart sounds.   Pulmonary/Chest: Effort normal and breath sounds normal.  Slightly shallow due to pain with deep breathe No pain on chest palpation  Musculoskeletal:  Moving very slowly due to pain.  Neurological: She is alert and oriented to person, place, and time.  Skin: Skin is warm.  Psychiatric: She has a normal mood and affect. Her behavior is normal. Judgment and thought content normal.   BP 125/75 (BP Location: Left Arm, Patient Position: Sitting, Cuff Size: Large)   Pulse 91   Temp 97.8 F (36.6 C) (Oral)   Ht 5\' 6"  (1.676 m)   Wt 238 lb 6.4 oz (108.1 kg)   LMP 08/02/2016   BMI 38.48 kg/m  O2 sat 100% room air        Assessment & Plan:  1. Myalgia Not sure  what is going on with patient- may be pulmonary emboli but heart rate normal and O2 sat normal- sitting still decreases pain- pain worsens with movement- I am leaning towards something muscular. Will try toradol and muscle relaxer- if worsens - to ER for chest x ray  Meds ordered this encounter  Medications  . cyclobenzaprine (FLEXERIL) 10 MG tablet    Sig: Take 1 tablet (10 mg total) by mouth 3 (three) times daily as needed for muscle spasms.    Dispense:  30 tablet    Refill:  0    Order Specific Question:   Supervising Provider    Answer:   VINCENT, CAROL L [[id number]]  . ketorolac (TORADOL) injection 60 mg   Mary-Margaret Daphine DeutscherMartin, FNP

## 2016-08-11 DIAGNOSIS — M5431 Sciatica, right side: Secondary | ICD-10-CM | POA: Diagnosis not present

## 2016-08-11 DIAGNOSIS — M79601 Pain in right arm: Secondary | ICD-10-CM | POA: Diagnosis not present

## 2016-08-11 DIAGNOSIS — G5621 Lesion of ulnar nerve, right upper limb: Secondary | ICD-10-CM | POA: Diagnosis not present

## 2016-08-12 DIAGNOSIS — M5412 Radiculopathy, cervical region: Secondary | ICD-10-CM | POA: Diagnosis not present

## 2016-08-13 DIAGNOSIS — M542 Cervicalgia: Secondary | ICD-10-CM | POA: Diagnosis not present

## 2016-08-13 DIAGNOSIS — M4322 Fusion of spine, cervical region: Secondary | ICD-10-CM | POA: Diagnosis not present

## 2016-08-15 ENCOUNTER — Encounter (HOSPITAL_COMMUNITY): Payer: Self-pay

## 2016-08-15 ENCOUNTER — Emergency Department (HOSPITAL_COMMUNITY)
Admission: EM | Admit: 2016-08-15 | Discharge: 2016-08-15 | Disposition: A | Discharge status: Home / Self Care | Payer: Medicare Other | Attending: Emergency Medicine | Admitting: Emergency Medicine

## 2016-08-15 DIAGNOSIS — I10 Essential (primary) hypertension: Secondary | ICD-10-CM | POA: Insufficient documentation

## 2016-08-15 DIAGNOSIS — M79601 Pain in right arm: Secondary | ICD-10-CM | POA: Diagnosis present

## 2016-08-15 DIAGNOSIS — Z79899 Other long term (current) drug therapy: Secondary | ICD-10-CM | POA: Insufficient documentation

## 2016-08-15 DIAGNOSIS — Z7984 Long term (current) use of oral hypoglycemic drugs: Secondary | ICD-10-CM | POA: Diagnosis not present

## 2016-08-15 DIAGNOSIS — M62830 Muscle spasm of back: Secondary | ICD-10-CM | POA: Diagnosis not present

## 2016-08-15 DIAGNOSIS — M62838 Other muscle spasm: Secondary | ICD-10-CM | POA: Diagnosis not present

## 2016-08-15 DIAGNOSIS — M25511 Pain in right shoulder: Secondary | ICD-10-CM | POA: Diagnosis not present

## 2016-08-15 DIAGNOSIS — E119 Type 2 diabetes mellitus without complications: Secondary | ICD-10-CM | POA: Diagnosis not present

## 2016-08-15 HISTORY — DX: Fibromyalgia: M79.7

## 2016-08-15 MED ORDER — IBUPROFEN 400 MG PO TABS: 400.0000 mg | ORAL_TABLET | Freq: Three times a day (TID) | ORAL | 0 refills | Status: DC | PRN

## 2016-08-15 MED ORDER — IBUPROFEN 400 MG PO TABS
600.0000 mg | ORAL_TABLET | Freq: Once | ORAL | Status: AC
  Administered 2016-08-15: 600 mg via ORAL
  Filled 2016-08-15: qty 2

## 2016-08-15 MED ORDER — CARISOPRODOL 350 MG PO TABS
350.0000 mg | ORAL_TABLET | Freq: Once | ORAL | Status: AC
  Administered 2016-08-15: 350 mg via ORAL

## 2016-08-15 MED ORDER — CARISOPRODOL 350 MG PO TABS: 350.0000 mg | ORAL_TABLET | Freq: Three times a day (TID) | ORAL | 0 refills | Status: DC | PRN

## 2016-08-15 MED ORDER — OXYCODONE-ACETAMINOPHEN 5-325 MG PO TABS
2.0000 | ORAL_TABLET | Freq: Once | ORAL | Status: AC
  Administered 2016-08-15: 2 via ORAL
  Filled 2016-08-15: qty 2

## 2016-08-15 NOTE — ED Provider Notes (Signed)
[facility]-EMER[facility]Y DEPT Provider Note   CSN: [id number] Arrival date & time: 08/15/16  [id number]  By signing my name below, I, Clovis PuAvnee Patel, attest that this documentation has been prepared under the direction and in the presence of Azalia BilisKevin Jayvien Rowlette, MD  Electronically Signed: Clovis PuAvnee Patel, ED Scribe. 08/15/16. 9:50 AM.   History   Chief Complaint Chief Complaint  Patient presents with  . Arm Pain   The history is provided by the patient. No language interpreter was used.   HPI Comments:  Natalie Yoder is a 40 y.o. female, with a PSHx of spine surgery, who presents to the Emergency Department complaining of gradually worsening, shooting and aching right shoulder and right arm pain ~2 weeks. She notes the pain radiates down her right arm. Pt denies any trauma, fevers and chills. She has visited Dr. Jacalyn LefevreStephen Miller on 08/04/16 and was given a trigger point injection with no relief. Pt also visited Dr. Bennie PieriniMary-Margaret Martin on 08/07/16 and was given a toradol shot and prescribed flexeril with no relief. She has also taken ibuprofen with temporary mild relief. Pt is allergic to Fentanyl. She denies any other symptoms or complaints at this time.  Past Medical History:  Diagnosis Date  . Diabetes mellitus without complication (HCC)    Type 2  . Fibromyalgia   . History of IBS   . Migraines     Patient Active Problem List   Diagnosis Date Noted  . Chest discomfort 08/04/2016  . Morbid obesity (HCC) 05/20/2016  . BMI 37.0-37.9, adult 04/29/2016  . Cervical vertebral fusion 04/23/2016  . Postpartum state 10/29/2015  . IBS (irritable bowel syndrome) 01/25/2011  . Hypertension 01/25/2011  . Diabetes mellitus type 2 with complications, uncontrolled (HCC) 01/25/2011  . Migraines 01/25/2011  . Low back pain 01/25/2011    Past Surgical History:  Procedure Laterality Date  . SPINE SURGERY     2008, 2011, 2012    OB History    Gravida Para Term Preterm AB Living   2 2 1 1   2    SAB TAB Ectopic  Multiple Live Births         0 2       Home Medications    Prior to Admission medications   Medication Sig Start Date End Date Taking? Authorizing Provider  albuterol (PROVENTIL HFA;VENTOLIN HFA) 108 (90 BASE) MCG/ACT inhaler Inhale 2 puffs into the lungs every 4 (four) hours as needed for wheezing or shortness of breath (cough, shortness of breath or wheezing.). 11/21/13   Floydene FlockSteven J Newton, MD  cyclobenzaprine (FLEXERIL) 10 MG tablet Take 1 tablet (10 mg total) by mouth 3 (three) times daily as needed for muscle spasms. 08/07/16   Mary-Margaret Daphine DeutscherMartin, FNP  diazepam (VALIUM) 10 MG tablet  07/21/16   Historical Provider, MD  DULoxetine (CYMBALTA) 30 MG capsule  07/28/16   Historical Provider, MD  empagliflozin (JARDIANCE) 10 MG TABS tablet Take 10 mg by mouth daily. 06/18/16   Johna Sheriffarol L Vincent, MD  glucose blood (ONE TOUCH ULTRA TEST) test strip Use as instructed 04/28/16   Johna Sheriffarol L Vincent, MD  ibuprofen (ADVIL,MOTRIN) 800 MG tablet Take 1 tablet (800 mg total) by mouth 3 (three) times daily. 04/23/16   Johna Sheriffarol L Vincent, MD  Lancets [facility](ONETOUCH ULTRASOFT) lancets Use as instructed 04/28/16   Johna Sheriffarol L Vincent, MD  metFORMIN (GLUCOPHAGE) 500 MG tablet Take 1 tablet (500 mg total) by mouth 2 (two) times daily with a meal. 06/18/16   Johna Sheriffarol L Vincent, MD  Prenatal Vit-Fe  Fumarate-FA (PRENATAL MULTIVITAMIN) TABS tablet Take 1 tablet by mouth at bedtime. Reported on 04/23/2016    Historical Provider, MD  traMADol Janean Sark) 50 MG tablet  07/08/16   Historical Provider, MD    Family History Family History  Problem Relation Age of Onset  . Diabetes Mother   . Hypertension Mother   . Diabetes Father   . Hypertension Father   . Hyperlipidemia Brother   . Hypertension Brother     Social History Social History  Substance Use Topics  . Smoking status: Never Smoker  . Smokeless tobacco: Never Used  . Alcohol use No     Comment: rarely     Allergies   Fentanyl; Hydrocodone; Penicillins; Robaxin  [methocarbamol]; and Exenatide   Review of Systems Review of Systems  Constitutional: Negative for chills and fever.  Musculoskeletal: Positive for myalgias.  Neurological: Negative for numbness.  10 systems reviewed and all are negative for acute change except as noted in the HPI.  Physical Exam Updated Vital Signs BP 156/99 (BP Location: Left Arm)   Pulse 98   Temp 97.5 F (36.4 C) (Oral)   Resp 18   Ht 5\' 6"  (1.676 m)   Wt 238 lb (108 kg)   LMP 08/02/2016   SpO2 100%   BMI 38.41 kg/m   Physical Exam  Constitutional: She is oriented to person, place, and time. She appears well-developed and well-nourished. No distress.  HENT:  Head: Normocephalic and atraumatic.  Eyes: EOM are normal.  Neck: Normal range of motion.  R sided paracervical and parathoracic tenderness with mild spasms. No C-spine point tenderness   Cardiovascular: Normal rate, regular rhythm and normal heart sounds.   Pulmonary/Chest: Effort normal and breath sounds normal.  Abdominal: Soft. She exhibits no distension. There is no tenderness.  Musculoskeletal: Normal range of motion.  Neurological: She is alert and oriented to person, place, and time.  Skin: Skin is warm and dry.  Psychiatric: She has a normal mood and affect. Judgment normal.  Nursing note and vitals reviewed.    ED Treatments / Results  DIAGNOSTIC STUDIES:  Oxygen Saturation is 100% on RA, normal by my interpretation.    COORDINATION OF CARE:  9:43 AM Discussed treatment plan with pt at bedside and pt agreed to plan.  Labs (all labs ordered are listed, but only abnormal results are displayed) Labs Reviewed - No data to display  EKG  EKG Interpretation None       Radiology No results found.  Procedures Procedures (including critical care time)  Medications Ordered in ED Medications - No data to display   Initial Impression / Assessment and Plan / ED Course  I have reviewed the triage vital signs and the nursing  notes.  Pertinent labs & imaging results that were available during my care of the patient were reviewed by me and considered in my medical decision making (see chart for details).  Clinical Course     Patient is overall well-appearing.  Normal right radial pulse.  Nothing to suggest DVT of her right upper extremity.  Likely musculoskeletal pain.  Patient be prescribed a muscle relaxant in addition to anti-inflammatories.  Primary care follow-up.  No indication for advanced imaging here  Final Clinical Impressions(s) / ED Diagnoses   Final diagnoses:  Muscle spasm    New Prescriptions Discharge Medication List as of 08/15/2016  9:39 AM    START taking these medications   Details  carisoprodol (SOMA) 350 MG tablet Take 1 tablet (350 mg  total) by mouth 3 (three) times daily as needed for muscle spasms., Starting Sun 08/15/2016, Print       I personally performed the services described in this documentation, which was scribed in my presence. The recorded information has been reviewed and is accurate.        Azalia BilisKevin Ashonte Angelucci, MD 08/15/16 [phone]

## 2016-08-15 NOTE — ED Notes (Signed)
[facility]

## 2016-08-15 NOTE — ED Triage Notes (Signed)
Pt reports r shoulder and arm pain x 2 weeks.  Denies injury.  Pt holding her arm up to help ease the pain.

## 2016-08-20 ENCOUNTER — Other Ambulatory Visit: Payer: Self-pay | Admitting: Orthopaedic Surgery

## 2016-08-20 DIAGNOSIS — M4322 Fusion of spine, cervical region: Secondary | ICD-10-CM

## 2016-08-24 ENCOUNTER — Encounter: Payer: Self-pay | Admitting: Pediatrics

## 2016-08-24 ENCOUNTER — Ambulatory Visit (INDEPENDENT_AMBULATORY_CARE_PROVIDER_SITE_OTHER): Payer: Medicare Other | Admitting: Pediatrics

## 2016-08-24 VITALS — BP 137/88 | HR 84 | Temp 97.1°F | Ht 66.0 in | Wt 236.0 lb

## 2016-08-24 DIAGNOSIS — M542 Cervicalgia: Secondary | ICD-10-CM | POA: Diagnosis not present

## 2016-08-24 DIAGNOSIS — M62838 Other muscle spasm: Secondary | ICD-10-CM | POA: Diagnosis not present

## 2016-08-24 DIAGNOSIS — R202 Paresthesia of skin: Secondary | ICD-10-CM | POA: Diagnosis not present

## 2016-08-24 DIAGNOSIS — R200 Anesthesia of skin: Secondary | ICD-10-CM | POA: Diagnosis not present

## 2016-08-24 MED ORDER — CARISOPRODOL 350 MG PO TABS: 350.0000 mg | ORAL_TABLET | Freq: Three times a day (TID) | ORAL | 0 refills | Status: DC | PRN

## 2016-08-24 NOTE — Progress Notes (Signed)
  Subjective:   Patient ID: Natalie Yoder, female    DOB: [birth year], 40 y.o.   MRN: [id number] CC: Arm Numbness (Right); Arm Pain (right); and Edema (Right shoulder)  HPI: Natalie Yoder is a 40 y.o. female presenting for Arm Numbness (Right); Arm Pain (right); and Edema (Right shoulder)  Has been having R arm pain  Tender over R deltoid Mostly arm pain is a numbness, burning, tingling, sometimes shooting pain Also hurts below and lateral to R shoulder blade Holding arm over her head helps with the pain Tilting head to the L shoulder shoulder helps the R arm pain/discomfort Using arm makes it worse Has been talking with Dr. Retia PasseSaullo, has an MRI scheduled for her neck in 2 weeks Has appt with Dr Retia PasseSaullo next week Was given soma in the emergency room, helped more than flexeril for the R shoulder/shoulder blade and upper back pain Does not make her sleepy Continues to intermittently feel weak in R hand/arm  Relevant past medical, surgical, family and social history reviewed. Allergies and medications reviewed and updated. History  Smoking Status  . Never Smoker  Smokeless Tobacco  . Never Used   ROS: Per HPI   Objective:    BP 137/88   Pulse 84   Temp 97.1 F (36.2 C) (Oral)   Ht 5\' 6"  (1.676 m)   Wt 236 lb (107 kg)   LMP 08/02/2016   BMI 38.09 kg/m   Wt Readings from Last 3 Encounters:  08/24/16 236 lb (107 kg)  08/15/16 238 lb (108 kg)  08/07/16 238 lb 6.4 oz (108.1 kg)    Gen: NAD, alert, cooperative with exam, NCAT EYES: EOMI, no conjunctival injection, or no icterus CV: NRRR, normal S1/S2, no murmur, distal pulses 2+ b/l Resp: CTABL, no wheezes, normal WOB Abd: +BS, soft, NTND. no guarding or organomegaly Neuro: Alert and oriented, different sensation to touch R arm compared with L Hand grip 5/5 b/l MSK: TTP over R deltoid, soft tissue medial to shoulder blade and lateral to shoulder blade  Assessment & Plan:  Natalie Yoder was seen today for arm numbness, arm pain and  edema. R arm tingling/numbness symptoms improve when she tilts head to the L Has upcoming MRI Also with TTP in soft tissue around shoulder blade Muscle relaxers help with that discomfort, no change in the numbness/tingling of arm Will refill muscle relaxer soma, stop flexeril Discuss medication changes with Dr. Retia PasseSaullo at next visit  Diagnoses and all orders for this visit:  Muscle spasm of right shoulder -     carisoprodol (SOMA) 350 MG tablet; Take 1 tablet (350 mg total) by mouth 3 (three) times daily as needed for muscle spasms.  Neck pain  Numbness and tingling of right upper extremity   Follow up plan: 4 weeks for DM2 Rex Krasarol Rufus Cypert, MD [facility]

## 2016-08-26 DIAGNOSIS — R202 Paresthesia of skin: Secondary | ICD-10-CM

## 2016-08-26 DIAGNOSIS — R200 Anesthesia of skin: Secondary | ICD-10-CM | POA: Insufficient documentation

## 2016-08-26 DIAGNOSIS — M62838 Other muscle spasm: Secondary | ICD-10-CM | POA: Insufficient documentation

## 2016-08-26 DIAGNOSIS — M542 Cervicalgia: Secondary | ICD-10-CM | POA: Insufficient documentation

## 2016-08-31 DIAGNOSIS — M542 Cervicalgia: Secondary | ICD-10-CM | POA: Diagnosis not present

## 2016-08-31 DIAGNOSIS — M5020 Other cervical disc displacement, unspecified cervical region: Secondary | ICD-10-CM | POA: Diagnosis not present

## 2016-08-31 DIAGNOSIS — M4322 Fusion of spine, cervical region: Secondary | ICD-10-CM | POA: Diagnosis not present

## 2016-09-04 ENCOUNTER — Ambulatory Visit
Admission: RE | Admit: 2016-09-04 | Discharge: 2016-09-04 | Disposition: A | Discharge status: Home / Self Care | Payer: Medicare Other | Source: Ambulatory Visit | Attending: Orthopaedic Surgery | Admitting: Orthopaedic Surgery

## 2016-09-04 DIAGNOSIS — M4322 Fusion of spine, cervical region: Secondary | ICD-10-CM

## 2016-09-04 DIAGNOSIS — M4802 Spinal stenosis, cervical region: Secondary | ICD-10-CM | POA: Diagnosis not present

## 2016-09-09 DIAGNOSIS — M542 Cervicalgia: Secondary | ICD-10-CM | POA: Diagnosis not present

## 2016-09-09 DIAGNOSIS — M5412 Radiculopathy, cervical region: Secondary | ICD-10-CM | POA: Diagnosis not present

## 2016-09-20 ENCOUNTER — Other Ambulatory Visit: Payer: Self-pay | Admitting: Orthopaedic Surgery

## 2016-09-20 DIAGNOSIS — M5412 Radiculopathy, cervical region: Secondary | ICD-10-CM

## 2016-09-29 ENCOUNTER — Ambulatory Visit
Admission: RE | Admit: 2016-09-29 | Discharge: 2016-09-29 | Disposition: A | Discharge status: Home / Self Care | Payer: Medicare Other | Source: Ambulatory Visit | Attending: Orthopaedic Surgery | Admitting: Orthopaedic Surgery

## 2016-09-29 DIAGNOSIS — M4322 Fusion of spine, cervical region: Secondary | ICD-10-CM | POA: Diagnosis not present

## 2016-09-29 DIAGNOSIS — M5412 Radiculopathy, cervical region: Secondary | ICD-10-CM

## 2016-09-30 DIAGNOSIS — M5020 Other cervical disc displacement, unspecified cervical region: Secondary | ICD-10-CM | POA: Diagnosis not present

## 2016-09-30 DIAGNOSIS — M542 Cervicalgia: Secondary | ICD-10-CM | POA: Diagnosis not present

## 2016-09-30 DIAGNOSIS — M5412 Radiculopathy, cervical region: Secondary | ICD-10-CM | POA: Diagnosis not present

## 2016-10-21 ENCOUNTER — Other Ambulatory Visit: Payer: Self-pay | Admitting: Pediatrics

## 2016-10-21 DIAGNOSIS — IMO0002 Reserved for concepts with insufficient information to code with codable children: Secondary | ICD-10-CM

## 2016-10-21 DIAGNOSIS — E118 Type 2 diabetes mellitus with unspecified complications: Principal | ICD-10-CM

## 2016-10-21 DIAGNOSIS — Z794 Long term (current) use of insulin: Principal | ICD-10-CM

## 2016-10-21 DIAGNOSIS — E1165 Type 2 diabetes mellitus with hyperglycemia: Secondary | ICD-10-CM

## 2016-11-01 ENCOUNTER — Ambulatory Visit: Payer: Self-pay | Admitting: Pediatrics

## 2017-02-04 DIAGNOSIS — M542 Cervicalgia: Secondary | ICD-10-CM | POA: Diagnosis not present

## 2017-02-04 DIAGNOSIS — G8929 Other chronic pain: Secondary | ICD-10-CM | POA: Diagnosis not present

## 2017-02-04 DIAGNOSIS — M545 Low back pain: Secondary | ICD-10-CM | POA: Diagnosis not present

## 2017-02-04 DIAGNOSIS — M961 Postlaminectomy syndrome, not elsewhere classified: Secondary | ICD-10-CM | POA: Diagnosis not present

## 2017-02-07 DIAGNOSIS — M961 Postlaminectomy syndrome, not elsewhere classified: Secondary | ICD-10-CM | POA: Diagnosis not present

## 2017-02-07 DIAGNOSIS — M5136 Other intervertebral disc degeneration, lumbar region: Secondary | ICD-10-CM | POA: Diagnosis not present

## 2017-02-07 DIAGNOSIS — M5416 Radiculopathy, lumbar region: Secondary | ICD-10-CM | POA: Diagnosis not present

## 2017-03-25 DIAGNOSIS — M542 Cervicalgia: Secondary | ICD-10-CM | POA: Diagnosis not present

## 2017-03-25 DIAGNOSIS — M961 Postlaminectomy syndrome, not elsewhere classified: Secondary | ICD-10-CM | POA: Diagnosis not present

## 2017-03-25 DIAGNOSIS — Z9889 Other specified postprocedural states: Secondary | ICD-10-CM | POA: Diagnosis not present

## 2017-03-29 DIAGNOSIS — M5136 Other intervertebral disc degeneration, lumbar region: Secondary | ICD-10-CM | POA: Diagnosis not present

## 2017-03-29 DIAGNOSIS — M961 Postlaminectomy syndrome, not elsewhere classified: Secondary | ICD-10-CM | POA: Diagnosis not present

## 2017-03-29 DIAGNOSIS — M5416 Radiculopathy, lumbar region: Secondary | ICD-10-CM | POA: Diagnosis not present

## 2017-04-04 DIAGNOSIS — M7542 Impingement syndrome of left shoulder: Secondary | ICD-10-CM | POA: Diagnosis not present

## 2017-04-04 DIAGNOSIS — M5412 Radiculopathy, cervical region: Secondary | ICD-10-CM | POA: Diagnosis not present

## 2017-04-04 DIAGNOSIS — M7552 Bursitis of left shoulder: Secondary | ICD-10-CM | POA: Diagnosis not present

## 2017-04-04 DIAGNOSIS — M5030 Other cervical disc degeneration, unspecified cervical region: Secondary | ICD-10-CM | POA: Diagnosis not present

## 2017-05-15 IMAGING — MR MR CERVICAL SPINE WO/W CM
6 of 8 series · 29 of 48 positions shown · IV contrast (multihance)
Comparison: MRI cervical spine without and with contrast 07/07/2016
aunt MRI of the cervical spine without contrast 11/05/2009

CLINICAL DATA: Progressive right-sided neck and upper extremity
burning and numbness following lumbar epidural steroid injection.

Creatinine was obtained on site at [HOSPITAL] at [HOSPITAL].Results: Creatinine 0.9 mg/dL.
EXAM:
MRI CERVICAL SPINE WITHOUT AND WITH CONTRAST
TECHNIQUE: Multiplanar and multiecho pulse sequences of the cervical spine, to
include the craniocervical junction and cervicothoracic junction,
were obtained without and with intravenous contrast.
CONTRAST:  20 mL MultiHance

[Series 4: T1 · sagittal · 3.0mm · 0.41mm/px · 4 of 13 slices shown (1 of 2)]
[im 1/13]
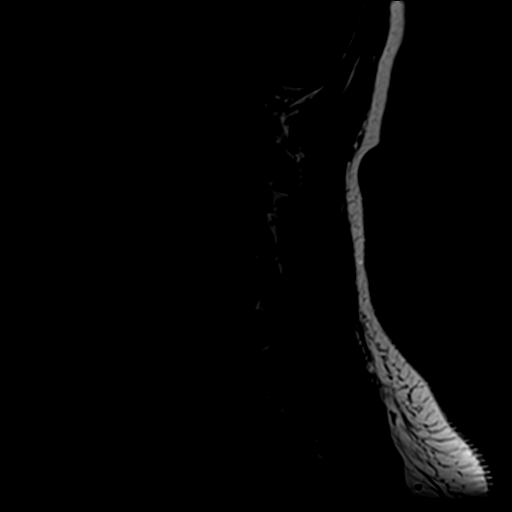
[im 5/13]
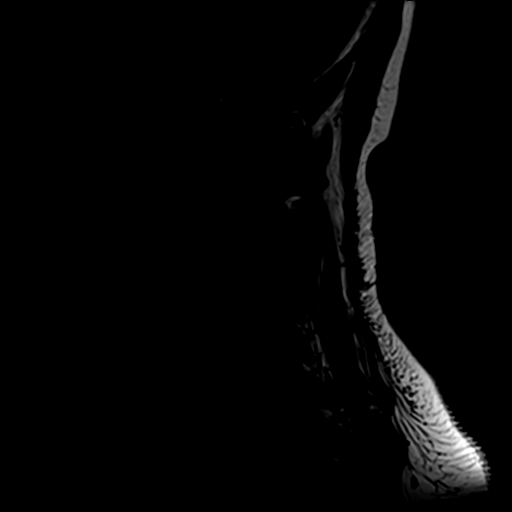
[im 9/13]
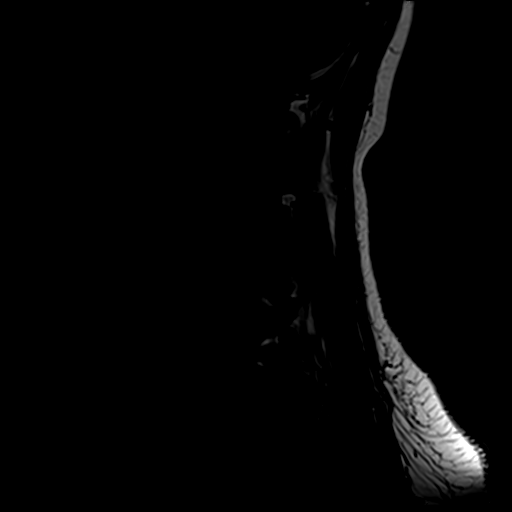
[im 13/13]
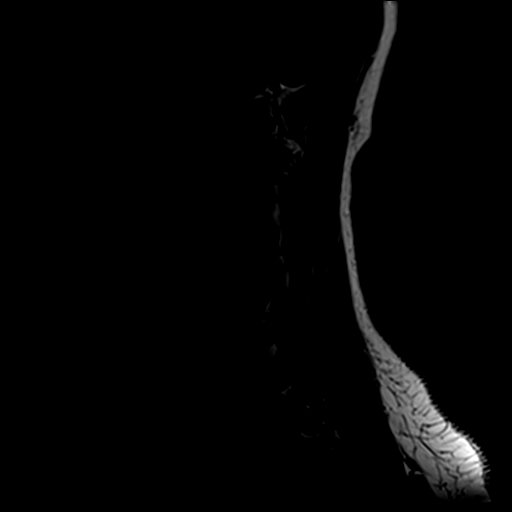

[Series 6: T2 · axial · 3.0mm · 0.70mm/px · z∈[-67,+33]mm · 8 of 28 slices shown (1 of 2)]
[im 1/28]
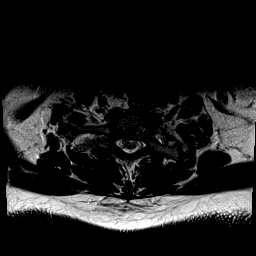
[im 4/28]
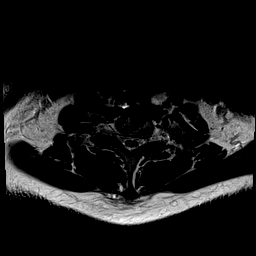
[im 8/28]
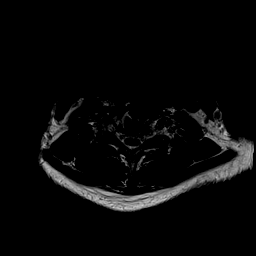
[im 12/28]
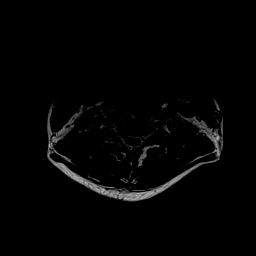
[im 16/28]
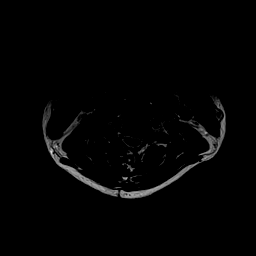
[im 20/28]
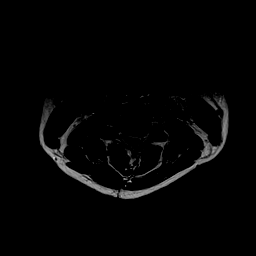
[im 24/28]
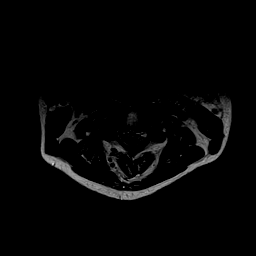
[im 28/28]
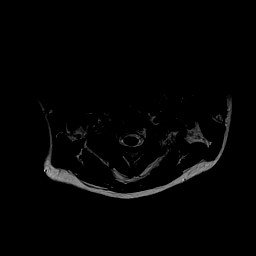

[Series 7: T1 · axial · 3.0mm · 0.35mm/px · z∈[-67,+33]mm · 8 of 28 slices shown (2 of 2)]
[im 1/28]
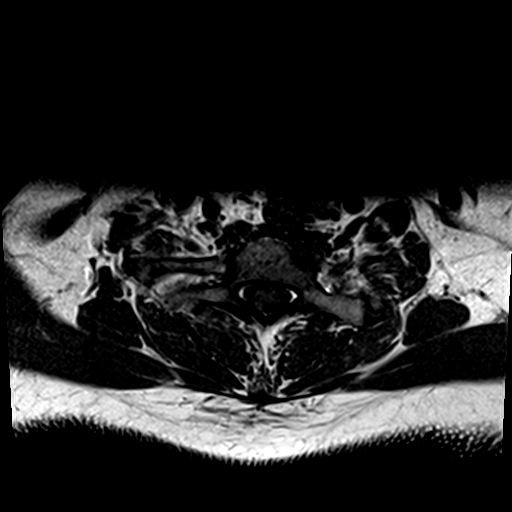
[im 4/28]
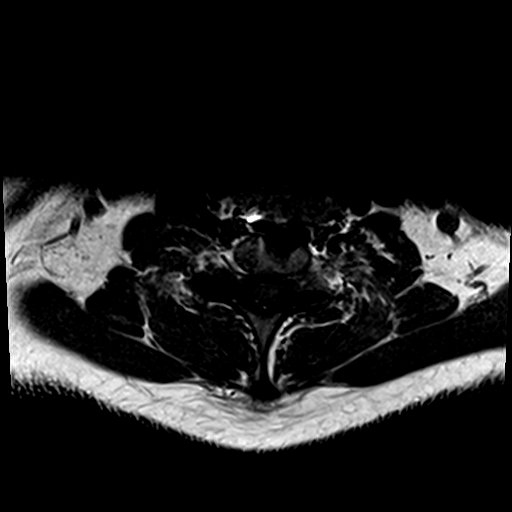
[im 8/28]
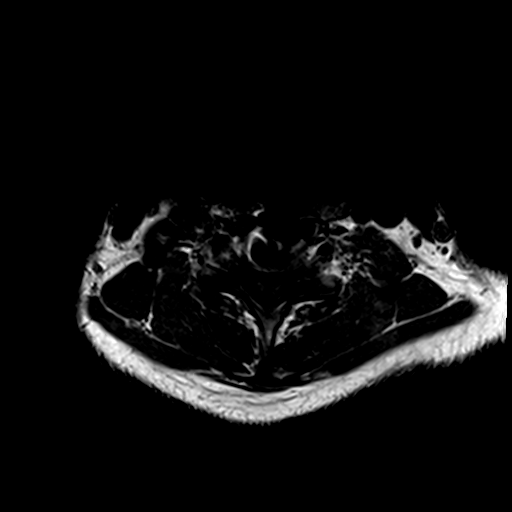
[im 12/28]
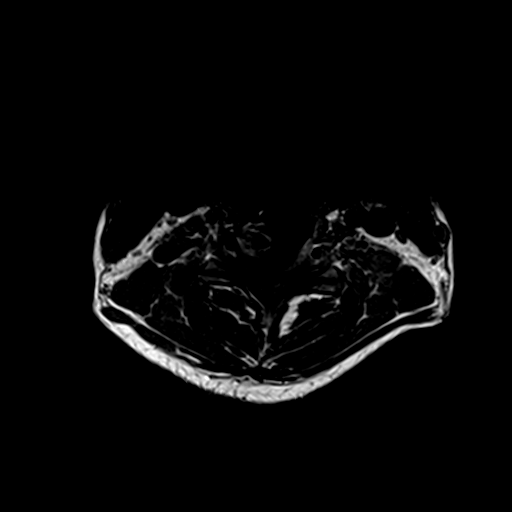
[im 16/28]
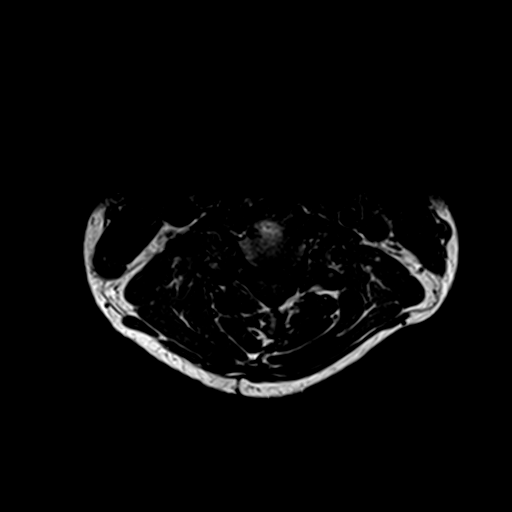
[im 20/28]
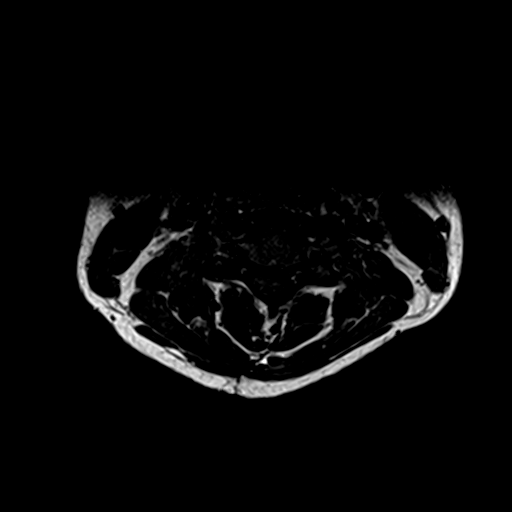
[im 24/28]
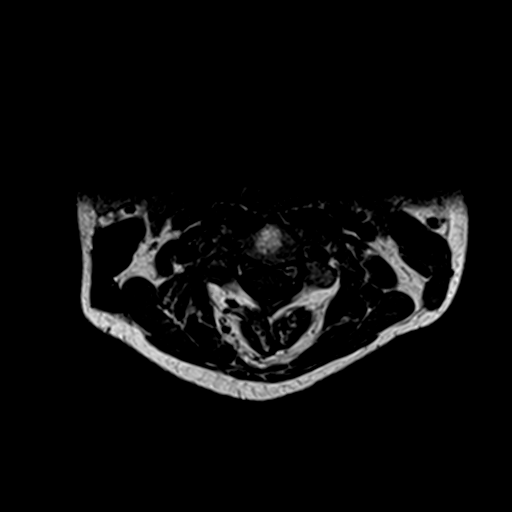
[im 28/28]
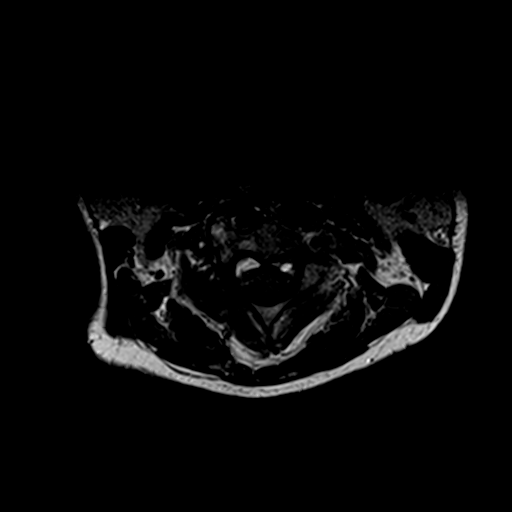

[Series 8: T2 · sagittal · 3.0mm · 0.66mm/px · 4 of 13 slices shown (2 of 2)]
[im 1/13]
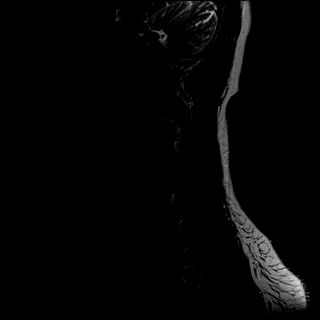
[im 5/13]
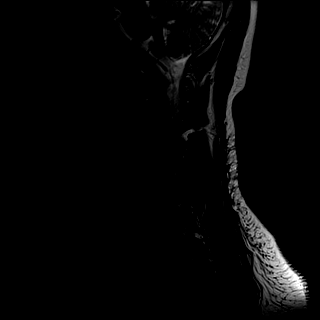
[im 9/13]
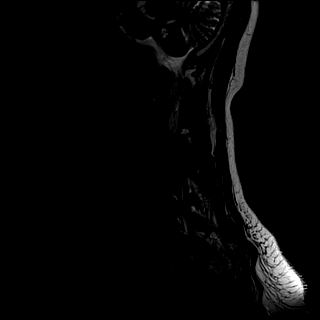
[im 13/13]
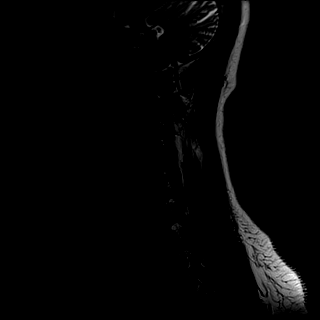

[Series 9: T1 fat-sat post-contrast · sagittal · 3.0mm · 0.82mm/px · 4 of 13 slices shown]
[im 1/13]
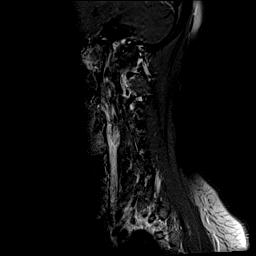
[im 5/13]
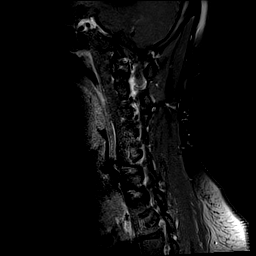
[im 9/13]
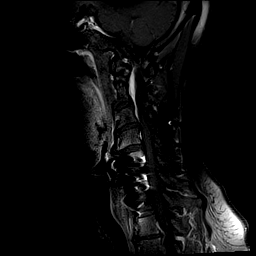
[im 13/13]
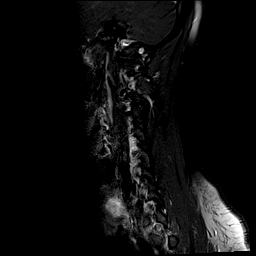

[Series 10: T1 post-contrast · axial · 3.0mm · 0.35mm/px · 1 of 32 slices shown]
[im 1/32]
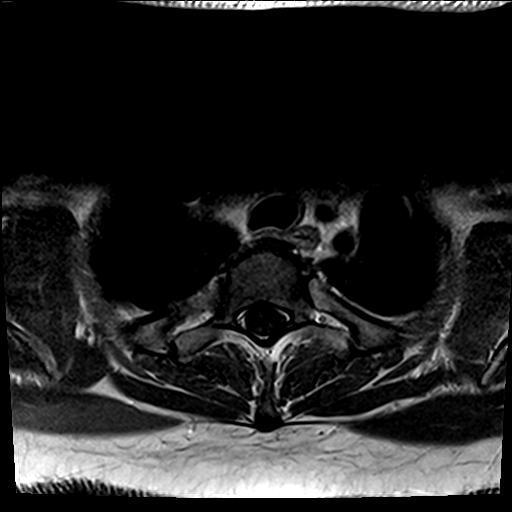

[29 of 48 positions shown; findings below may reference images not displayed]

FINDINGS: Alignment: AP alignment is anatomic. There is stable straightening
of the normal cervical lordosis.

Vertebrae: A hemangioma anteriorly at C3 is stable. Chronic endplate
marrow changes are present at C4-5, C5-6, and C6-7.

Cord: Normal signal is present in the cervical and upper thoracic
spinal cord to the lowest imaged level.

Posterior Fossa, vertebral arteries, paraspinal tissues: The
craniocervical junction is within normal limits. The visualized
intracranial contents are normal. Flow is present in the vertebral
arteries bilaterally.

Disc levels:

C2-3: Minimal uncovertebral spurring is present on the left without
significant stenosis or change.

C3-4: A leftward disc osteophyte complex is present. Uncovertebral
and facet hypertrophy result in moderate foraminal narrowing
bilaterally, left greater than right. There is no significant
interval change. There is effacement of ventral CSF. The canal is
narrowed to 8.5 mm.

C4-5: A leftward disc osteophyte complex is again seen. There is
partial effacement of the ventral CSF. Moderate foraminal narrowing
bilaterally is stable.

C5-6: Anterior fusion is present. No residual recurrent stenosis is
present.

C6-7: Anterior fusion is present. No residual recurrent stenosis is
present.

C7-T1: A leftward disc protrusion is present without significant
stenosis.

T1-2: A right superior disc extrusion extends into the right neural
foramen with mild narrowing.
IMPRESSION: 1. Moderate foraminal stenosis at C3-4 is worse on the left without
significant interval change.
2. Moderate central canal stenosis at C3-4 and C4-5 is stable.
3. Moderate bilateral foraminal stenosis at C4-5.
4. Anterior fusion at C5-6 and C6-7 without residual or recurrent
stenosis.
5. Leftward disc protrusion at C7-T1 without significant stenosis.
6. Right superior disc extrusion at T1-2 extends into the right
neural foramen with mild narrowing.

## 2017-05-16 DIAGNOSIS — M7542 Impingement syndrome of left shoulder: Secondary | ICD-10-CM | POA: Diagnosis not present

## 2017-05-16 DIAGNOSIS — M7552 Bursitis of left shoulder: Secondary | ICD-10-CM | POA: Diagnosis not present

## 2017-05-21 DIAGNOSIS — M7552 Bursitis of left shoulder: Secondary | ICD-10-CM | POA: Diagnosis not present

## 2017-05-30 DIAGNOSIS — M7542 Impingement syndrome of left shoulder: Secondary | ICD-10-CM | POA: Diagnosis not present

## 2017-05-30 DIAGNOSIS — M7502 Adhesive capsulitis of left shoulder: Secondary | ICD-10-CM | POA: Diagnosis not present

## 2017-05-30 DIAGNOSIS — M24112 Other articular cartilage disorders, left shoulder: Secondary | ICD-10-CM | POA: Diagnosis not present

## 2017-06-15 ENCOUNTER — Ambulatory Visit: Payer: Medicare Other | Attending: Orthopedic Surgery | Admitting: Physical Therapy

## 2017-06-15 DIAGNOSIS — M25512 Pain in left shoulder: Secondary | ICD-10-CM | POA: Insufficient documentation

## 2017-06-15 DIAGNOSIS — M25612 Stiffness of left shoulder, not elsewhere classified: Secondary | ICD-10-CM

## 2017-06-15 DIAGNOSIS — G8929 Other chronic pain: Secondary | ICD-10-CM

## 2017-06-15 NOTE — Therapy (Signed)
[facility] [address] [place], [place], [address] Phone: [phone]   Fax:  [phone]  Physical Therapy Evaluation  Patient Details  Name: Natalie Yoder MRN: [id number] Date of Birth: [birth year] Referring Provider: Duwayne HeckJason Rogers MD.  Encounter Date: 06/15/2017      PT End of Session - 06/15/17 1223    Visit Number 1   Number of Visits 16   Date for PT Re-Evaluation 08/14/17   PT Start Time 1114   PT Stop Time 1207   PT Time Calculation (min) 53 min   Activity Tolerance Patient tolerated treatment well   Behavior During Therapy [facility] for tasks assessed/performed      Past Medical History:  Diagnosis Date  . Diabetes mellitus without complication (HCC)    Type 2  . Fibromyalgia   . History of IBS   . Migraines     Past Surgical History:  Procedure Laterality Date  . SPINE SURGERY     2008, 2011, 2012    There were no vitals filed for this visit.       Subjective Assessment - 06/15/17 1229    Subjective The patient presents to physical therapy with c/o left shoulder pain.  Her pain has not improved over the last 6 months.  Her pain is rated at an 8/10 especially when lifting her left UE.  Rest decreases her pain.  She reports pain into the left side of her neck and she has had numbness into her hands.  She has had a cervical surgery.  The patients sleep is disturbed by pain.   Pertinent History Cervical surgery.  DM.  Fibromyalgia.  Migraines.   Patient Stated Goals Use arm without pain.   Currently in Pain? Yes   Pain Score 8    Pain Location Shoulder   Pain Orientation Left   Pain Descriptors / Indicators Aching;Sharp;Throbbing   Pain Type Chronic pain   Pain Radiating Towards Left UE.   Pain Onset More than a month ago   Pain Frequency Constant   Aggravating Factors  See above.   Pain Relieving Factors See above.            [facility] - 06/15/17 0001      Assessment   Medical Diagnosis  Impingement syndrome of left shoulder.   Referring Provider Duwayne HeckJason Rogers MD.   Onset Date/Surgical Date --  6 months.     Restrictions   Weight Bearing Restrictions No     Balance Screen   Has the patient fallen in the past 6 months No   Has the patient had a decrease in activity level because of a fear of falling?  No   Is the patient reluctant to leave their home because of a fear of falling?  No     Home Environment   Living Environment Private residence     Prior Function   Level of Independence Independent     Posture/Postural Control   Posture/Postural Control Postural limitations   Postural Limitations Rounded Shoulders;Forward head     ROM / Strength   AROM / PROM / Strength AROM;Strength     AROM   Overall AROM Comments Active left shoulder flexion to 80 degrees and passive to 140 degrees; ER= 80 degrees and behind back limited only to left hip.     Strength   Overall Strength Comments Left deltoid strength; IR= 4-/5 limited most likely due to pain.     Palpation   Palpation comment Patient  c/o diffuse left shoulder pain.  Posterior cuff region; anterior shoulder and over left UT.     Special Tests    Special Tests Rotator Cuff Impingement  1+/4+ for left UE DTR's.   Rotator Cuff Impingment tests Leanord Asal test;Drop Arm test     Hawkins-Kennedy test   Findings Positive   Side Left     Drop Arm test   Findings Negative   Side Left     Ambulation/Gait   Gait Comments WNL.            Objective measurements completed on examination: See above findings.          [facility] Adult PT Treatment/Exercise - 06/15/17 0001      Modalities   Modalities Electrical Stimulation     Electrical Stimulation   Electrical Stimulation Location Left shoulder.   Electrical Stimulation Action IFC   Electrical Stimulation Parameters 80-150 Hz at 100% scan x 20 minutes.   Electrical Stimulation Goals Pain                  PT Short Term Goals -  06/15/17 1255      PT SHORT TERM GOAL #1   Title Independent with a HEP.   Time 3   Period Weeks   Status New     PT SHORT TERM GOAL #2   Title Full passive left shoulder PROM.   Time 4   Period Weeks   Status New           PT Long Term Goals - 06/15/17 1255      PT LONG TERM GOAL #1   Title Independent with an advanced HEP.   Time 8   Period Weeks   Status New     PT LONG TERM GOAL #2   Title Sleep 6 hours undisturbed.   Time 8   Period Weeks   Status New     PT LONG TERM GOAL #3   Title Perform ADL's with pain not > 3/10.   Time 8   Period Weeks   Status New     PT LONG TERM GOAL #4   Title Active left shoulder flexion to 145 degrees so the patient can easily reach overhead.   Time 8   Period Weeks   Status New     PT LONG TERM GOAL #5   Title Increase ROM so patient is able to reach behind back to L3 with left hand.   Time 8   Period Weeks   Status New     PT LONG TERM GOAL #6   Title Increase left shoulder strength to a solid 4+/5 to increase stability for performance of functional activities.   Time 8   Period Weeks   Status New                Plan - 06/15/17 1243    Clinical Impression Statement The patient presents to OPPT with c/o left shoulder pain.  She has significant losses of left shoulder range of motion and loss of strength as well.  She has a positive left shoulder Impingement test.  Her deficits prevent her from performing ADL's and sleeping.  Patient has diffuse c/o left shoulder pain and over her left UT.  She has a h/o fibromyalgia and a cervical surgery.  Patient will benefit from skilled physical therapy.   History and Personal Factors relevant to plan of care: Cervical surgery.  DM.  Fibromyalgia.  Migraines.   Clinical Presentation  Evolving   Clinical Presentation due to: Worsening symptoms.   Clinical Decision Making Moderate   Rehab Potential Good   Clinical Impairments Affecting Rehab Potential Multiple  co-morbidites.   PT Frequency 2x / week   PT Duration 8 weeks   PT Treatment/Interventions ADLs/Self Care Home Management;Cryotherapy;Electrical Stimulation;Moist Heat;Ultrasound;Therapeutic activities;Therapeutic exercise;Manual techniques;Patient/family education;Passive range of motion;Vasopneumatic Device   PT Next Visit Plan Please establish a HEP for left shoulder ROM.  Modalites.  Left shoulder PROM.   Consulted and Agree with Plan of Care Patient      Patient will benefit from skilled therapeutic intervention in order to improve the following deficits and impairments:  Decreased activity tolerance, Decreased strength, Decreased range of motion, Pain  Visit Diagnosis: Chronic left shoulder pain - Plan: PT plan of care cert/re-cert  Stiffness of left shoulder joint - Plan: PT plan of care cert/re-cert      G-Codes - 2017/07/02 1243    Functional Assessment Tool Used (Outpatient Only) FOTO...57% limitation.   Functional Limitation Self care   Self Care Current Status [phone]) At least 40 percent but less than 60 percent impaired, limited or restricted   Self Care Goal Status (U0454) At least 20 percent but less than 40 percent impaired, limited or restricted       Problem List Patient Active Problem List   Diagnosis Date Noted  . Muscle spasm of right shoulder 08/26/2016  . Neck pain 08/26/2016  . Numbness and tingling of right upper extremity 08/26/2016  . Chest discomfort 08/04/2016  . Morbid obesity (HCC) 05/20/2016  . BMI 37.0-37.9, adult 04/29/2016  . Cervical vertebral fusion 04/23/2016  . Postpartum state 10/29/2015  . IBS (irritable bowel syndrome) 01/25/2011  . Hypertension 01/25/2011  . Diabetes mellitus type 2 with complications, uncontrolled (HCC) 01/25/2011  . Migraines 01/25/2011  . Low back pain 01/25/2011    Kunaal Walkins, [place] MPT 02-Jul-2017, 1:00 PM  [facility] [address] [place], [place], [address] Phone:  [phone]   Fax:  [phone]  Name: Natalie Yoder MRN: [id number] Date of Birth: 28-Jun-1976

## 2017-06-16 ENCOUNTER — Other Ambulatory Visit: Payer: Self-pay

## 2017-06-16 DIAGNOSIS — Z794 Long term (current) use of insulin: Principal | ICD-10-CM

## 2017-06-16 DIAGNOSIS — E1165 Type 2 diabetes mellitus with hyperglycemia: Secondary | ICD-10-CM

## 2017-06-16 DIAGNOSIS — E118 Type 2 diabetes mellitus with unspecified complications: Principal | ICD-10-CM

## 2017-06-16 DIAGNOSIS — IMO0002 Reserved for concepts with insufficient information to code with codable children: Secondary | ICD-10-CM

## 2017-06-16 NOTE — Telephone Encounter (Signed)
08/24/16  Dr Oswaldo DoneVincent

## 2017-06-21 ENCOUNTER — Ambulatory Visit: Payer: Medicare Other | Admitting: *Deleted

## 2017-06-21 DIAGNOSIS — G8929 Other chronic pain: Secondary | ICD-10-CM | POA: Diagnosis not present

## 2017-06-21 DIAGNOSIS — M25512 Pain in left shoulder: Secondary | ICD-10-CM | POA: Diagnosis not present

## 2017-06-21 DIAGNOSIS — M25612 Stiffness of left shoulder, not elsewhere classified: Secondary | ICD-10-CM

## 2017-06-21 NOTE — Therapy (Signed)
[facility] [address] [place], [place], [address] Phone: [phone]   Fax:  [phone]  Physical Therapy Treatment  Patient Details  Name: Natalie Yoder MRN: [id number] Date of Birth: 1976-05-08 Referring Provider: Duwayne Heck MD.  Encounter Date: 06/21/2017      PT End of Session - 06/21/17 1822    Visit Number 2   Number of Visits 16   Date for PT Re-Evaluation 08/14/17   PT Start Time 1030   PT Stop Time 1119   PT Time Calculation (min) 49 min      Past Medical History:  Diagnosis Date  . Diabetes mellitus without complication (HCC)    Type 2  . Fibromyalgia   . History of IBS   . Migraines     Past Surgical History:  Procedure Laterality Date  . SPINE SURGERY     2008, 2011, 2012    There were no vitals filed for this visit.      Subjective Assessment - 06/21/17 1039    Subjective (P)  The patient presents to physical therapy with c/o left shoulder pain.  Her pain has not improved over the last 6 months.  Her pain is rated at an 8/10 especially when lifting her left UE.  Rest decreases her pain.  She reports pain into the left side of her neck and she has had numbness into her hands.  She has had a cervical surgery.  The patients sleep is disturbed by pain.   Pertinent History (P)  Cervical surgery.  DM.  Fibromyalgia.  Migraines.   Patient Stated Goals (P)  Use arm without pain.   Currently in Pain? (P)  Yes   Pain Score (P)  8                          [facility] Adult PT Treatment/Exercise - 06/21/17 0001      Exercises   Exercises Shoulder     Shoulder Exercises: Supine   Other Supine Exercises Supine Cane press  2 x 10 and flexion 2x 10     Shoulder Exercises: Standing   External Rotation AROM;Left;20 reps     Modalities   Modalities Corporate investment banker Stimulation Location Left shoulder.   Electrical Stimulation Action IFC   Electrical  Stimulation Parameters 80-150hz    Electrical Stimulation Goals Pain                  PT Short Term Goals - 06/15/17 1255      PT SHORT TERM GOAL #1   Title Independent with a HEP.   Time 3   Period Weeks   Status New     PT SHORT TERM GOAL #2   Title Full passive left shoulder PROM.   Time 4   Period Weeks   Status New           PT Long Term Goals - 06/15/17 1255      PT LONG TERM GOAL #1   Title Independent with an advanced HEP.   Time 8   Period Weeks   Status New     PT LONG TERM GOAL #2   Title Sleep 6 hours undisturbed.   Time 8   Period Weeks   Status New     PT LONG TERM GOAL #3   Title Perform ADL's with pain not > [facility] 8   Period Weeks  Status New     PT LONG TERM GOAL #4   Title Active left shoulder flexion to 145 degrees so the patient can easily reach overhead.   Time 8   Period Weeks   Status New     PT LONG TERM GOAL #5   Title Increase ROM so patient is able to reach behind back to L3 with left hand.   Time 8   Period Weeks   Status New     PT LONG TERM GOAL #6   Title Increase left shoulder strength to a solid 4+/5 to increase stability for performance of functional activities.   Time 8   Period Weeks   Status New               Plan - 06/21/17 1036    Clinical Impression Statement Pt arrived to clinic today with pain still in LT shldr. She did fairly well with AAROM exs and modalities and had decreased pain after RX. She responded well to [place] and other modalities.   Clinical Presentation Evolving   Clinical Decision Making Moderate   Clinical Impairments Affecting Rehab Potential Multiple co-morbidites.   PT Frequency 2x / week   PT Duration 8 weeks   PT Treatment/Interventions ADLs/Self Care Home Management;Cryotherapy;Electrical Stimulation;Moist Heat;Ultrasound;Therapeutic activities;Therapeutic exercise;Manual techniques;Patient/family education;Passive range of motion;Vasopneumatic Device   PT Next  Visit Plan Please establish a HEP for left shoulder ROM.  Modalites.  Left shoulder PROM.   Consulted and Agree with Plan of Care Patient      Patient will benefit from skilled therapeutic intervention in order to improve the following deficits and impairments:  Decreased activity tolerance, Decreased strength, Decreased range of motion, Pain  Visit Diagnosis: Chronic left shoulder pain  Stiffness of left shoulder joint     Problem List Patient Active Problem List   Diagnosis Date Noted  . Muscle spasm of right shoulder 08/26/2016  . Neck pain 08/26/2016  . Numbness and tingling of right upper extremity 08/26/2016  . Chest discomfort 08/04/2016  . Morbid obesity (HCC) 05/20/2016  . BMI 37.0-37.9, adult 04/29/2016  . Cervical vertebral fusion 04/23/2016  . Postpartum state 10/29/2015  . IBS (irritable bowel syndrome) 01/25/2011  . Hypertension 01/25/2011  . Diabetes mellitus type 2 with complications, uncontrolled (HCC) 01/25/2011  . Migraines 01/25/2011  . Low back pain 01/25/2011    Neri Samek,CHRIS 06/21/2017, 6:31 PM  [facility] [address] [place], [place], [address] Phone: [phone]   Fax:  [phone]  Name: Natalie Yoder MRN: [id number] Date of Birth: [birth year]

## 2017-06-23 ENCOUNTER — Encounter: Payer: Medicare Other | Admitting: *Deleted

## 2017-06-23 NOTE — Therapy (Signed)
[facility] [address] [place], [place], [address] Phone: [phone]   Fax:  [phone]  Physical Therapy Treatment  Patient Details  Name: Natalie Yoder MRN: [id number] Date of Birth: 1975/11/29 Referring Provider: Duwayne Heck MD.  Encounter Date: 06/21/2017    Past Medical History:  Diagnosis Date  . Diabetes mellitus without complication (HCC)    Type 2  . Fibromyalgia   . History of IBS   . Migraines     Past Surgical History:  Procedure Laterality Date  . SPINE SURGERY     2008, 2011, 2012    There were no vitals filed for this visit.         [place] 1.5 w/cm2 x 10 min to LT shldr                        PT Short Term Goals - 06/15/17 1255      PT SHORT TERM GOAL #1   Title Independent with a HEP.   Time 3   Period Weeks   Status New     PT SHORT TERM GOAL #2   Title Full passive left shoulder PROM.   Time 4   Period Weeks   Status New           PT Long Term Goals - 06/15/17 1255      PT LONG TERM GOAL #1   Title Independent with an advanced HEP.   Time 8   Period Weeks   Status New     PT LONG TERM GOAL #2   Title Sleep 6 hours undisturbed.   Time 8   Period Weeks   Status New     PT LONG TERM GOAL #3   Title Perform ADL's with pain not > 3/10.   Time 8   Period Weeks   Status New     PT LONG TERM GOAL #4   Title Active left shoulder flexion to 145 degrees so the patient can easily reach overhead.   Time 8   Period Weeks   Status New     PT LONG TERM GOAL #5   Title Increase ROM so patient is able to reach behind back to L3 with left hand.   Time 8   Period Weeks   Status New     PT LONG TERM GOAL #6   Title Increase left shoulder strength to a solid 4+/5 to increase stability for performance of functional activities.   Time 8   Period Weeks   Status New             Patient will benefit from skilled therapeutic intervention in order to improve  the following deficits and impairments:  Decreased activity tolerance, Decreased strength, Decreased range of motion, Pain  Visit Diagnosis: Chronic left shoulder pain  Stiffness of left shoulder joint     Problem List Patient Active Problem List   Diagnosis Date Noted  . Muscle spasm of right shoulder 08/26/2016  . Neck pain 08/26/2016  . Numbness and tingling of right upper extremity 08/26/2016  . Chest discomfort 08/04/2016  . Morbid obesity (HCC) 05/20/2016  . BMI 37.0-37.9, adult 04/29/2016  . Cervical vertebral fusion 04/23/2016  . Postpartum state 10/29/2015  . IBS (irritable bowel syndrome) 01/25/2011  . Hypertension 01/25/2011  . Diabetes mellitus type 2 with complications, uncontrolled (HCC) 01/25/2011  . Migraines 01/25/2011  . Low back pain 01/25/2011    RAMSEUR,CHRIS 06/23/2017, 10:25 AM  [facility] [address] [place], [place], [address] Phone: [phone]   Fax:  [phone]  Name: Natalie Yoder MRN: [id number] Date of Birth: [birth year]

## 2017-06-27 ENCOUNTER — Encounter: Payer: Self-pay | Admitting: Physical Therapy

## 2017-06-27 ENCOUNTER — Ambulatory Visit: Payer: Medicare Other | Admitting: Physical Therapy

## 2017-06-27 DIAGNOSIS — M25512 Pain in left shoulder: Principal | ICD-10-CM

## 2017-06-27 DIAGNOSIS — G8929 Other chronic pain: Secondary | ICD-10-CM | POA: Diagnosis not present

## 2017-06-27 DIAGNOSIS — M25612 Stiffness of left shoulder, not elsewhere classified: Secondary | ICD-10-CM

## 2017-06-27 NOTE — Therapy (Signed)
[facility] [address] [place], [place], [address] Phone: [phone]   Fax:  [phone]  Physical Therapy Treatment  Patient Details  Name: Natalie Yoder MRN: [id number] Date of Birth: Jul 19, 1976 Referring Provider: Duwayne Heck MD.  Encounter Date: 06/27/2017      PT End of Session - 06/27/17 1428    Visit Number 3   Number of Visits 16   Date for PT Re-Evaluation 08/14/17   PT Start Time 1428   PT Stop Time 1516   PT Time Calculation (min) 48 min      Past Medical History:  Diagnosis Date  . Diabetes mellitus without complication (HCC)    Type 2  . Fibromyalgia   . History of IBS   . Migraines     Past Surgical History:  Procedure Laterality Date  . SPINE SURGERY     2008, 2011, 2012    There were no vitals filed for this visit.      Subjective Assessment - 06/27/17 1426    Subjective Reports that the exercises have hurt her shoulder and thinks the weather may be affecting her shoulder as well. Reports that LUE will swell and be numb. L palm presents with more discoloration such as blue/red than R palm.   Pertinent History Cervical surgery.  DM.  Fibromyalgia.  Migraines.   Patient Stated Goals Use arm without pain.   Currently in Pain? Yes   Pain Score 5    Pain Location Shoulder   Pain Orientation Left   Pain Descriptors / Indicators Discomfort   Pain Type Chronic pain   Pain Radiating Towards LUE with most pain in L wrist area.   Pain Onset More than a month ago                         [facility] Adult PT Treatment/Exercise - 06/27/17 0001      Modalities   Modalities Electrical Stimulation;Ultrasound;Moist Heat     Moist Heat Therapy   Number Minutes Moist Heat 15 Minutes   Moist Heat Location Shoulder     Electrical Stimulation   Electrical Stimulation Location L shoulder   Electrical Stimulation Action IFC   Electrical Stimulation Parameters 1-10 hz x15 min   Electrical  Stimulation Goals Pain;Tone     Ultrasound   Ultrasound Location L acromian region   Ultrasound Parameters 1.5 w/cm2, 100%, 1 mhz x10 min   Ultrasound Goals Pain     Manual Therapy   Manual Therapy Soft tissue mobilization;Passive ROM   Soft tissue mobilization STW to L deltoids to reduce tone and pain   Passive ROM PROM of L shoulder gently into flexion                  PT Short Term Goals - 06/15/17 1255      PT SHORT TERM GOAL #1   Title Independent with a HEP.   Time 3   Period Weeks   Status New     PT SHORT TERM GOAL #2   Title Full passive left shoulder PROM.   Time 4   Period Weeks   Status New           PT Long Term Goals - 06/15/17 1255      PT LONG TERM GOAL #1   Title Independent with an advanced HEP.   Time 8   Period Weeks   Status New     PT LONG TERM GOAL #2  Title Sleep 6 hours undisturbed.   Time 8   Period Weeks   Status New     PT LONG TERM GOAL #3   Title Perform ADL's with pain not > 3/10.   Time 8   Period Weeks   Status New     PT LONG TERM GOAL #4   Title Active left shoulder flexion to 145 degrees so the patient can easily reach overhead.   Time 8   Period Weeks   Status New     PT LONG TERM GOAL #5   Title Increase ROM so patient is able to reach behind back to L3 with left hand.   Time 8   Period Weeks   Status New     PT LONG TERM GOAL #6   Title Increase left shoulder strength to a solid 4+/5 to increase stability for performance of functional activities.   Time 8   Period Weeks   Status New               Plan - 06/27/17 1513    Clinical Impression Statement Patient presented in clinic with increased pain over the last few days secondary to irritaiton from exercises and from weather per patient report. Normal [place] response to L  acromian region to reduce pain and tone in the region. PROM completed for short duration before patient reported increased pain and irritation from PROM into flexion.  Oscillations utilized for the LUE to reduce pain and STW initiated for the L deltoids to reduce tone. Normal modalities response noted following removal of the modalities. Patient continues to experienced greater pain from L elbow to wrist along the posterior side.   Rehab Potential Good   Clinical Impairments Affecting Rehab Potential Multiple co-morbidites.   PT Frequency 2x / week   PT Duration 8 weeks   PT Treatment/Interventions ADLs/Self Care Home Management;Cryotherapy;Electrical Stimulation;Moist Heat;Ultrasound;Therapeutic activities;Therapeutic exercise;Manual techniques;Patient/family education;Passive range of motion;Vasopneumatic Device   PT Next Visit Plan Please establish a HEP for left shoulder ROM.  Modalites.  Left shoulder PROM.   Consulted and Agree with Plan of Care Patient      Patient will benefit from skilled therapeutic intervention in order to improve the following deficits and impairments:  Decreased activity tolerance, Decreased strength, Decreased range of motion, Pain  Visit Diagnosis: Chronic left shoulder pain  Stiffness of left shoulder joint     Problem List Patient Active Problem List   Diagnosis Date Noted  . Muscle spasm of right shoulder 08/26/2016  . Neck pain 08/26/2016  . Numbness and tingling of right upper extremity 08/26/2016  . Chest discomfort 08/04/2016  . Morbid obesity (HCC) 05/20/2016  . BMI 37.0-37.9, adult 04/29/2016  . Cervical vertebral fusion 04/23/2016  . Postpartum state 10/29/2015  . IBS (irritable bowel syndrome) 01/25/2011  . Hypertension 01/25/2011  . Diabetes mellitus type 2 with complications, uncontrolled (HCC) 01/25/2011  . Migraines 01/25/2011  . Low back pain 01/25/2011    Evelene Croon, PTA 06/27/2017, 3:22 PM  [facility] [address] [place], [place], [address] Phone: [phone]   Fax:  [phone]  Name: Natalie Yoder MRN: [id number] Date of Birth:  29-Jun-1976

## 2017-06-29 DIAGNOSIS — M24112 Other articular cartilage disorders, left shoulder: Secondary | ICD-10-CM | POA: Diagnosis not present

## 2017-06-30 ENCOUNTER — Encounter: Payer: Self-pay | Admitting: Physical Therapy

## 2017-06-30 ENCOUNTER — Ambulatory Visit: Payer: Medicare Other | Admitting: Physical Therapy

## 2017-06-30 DIAGNOSIS — G8929 Other chronic pain: Secondary | ICD-10-CM | POA: Diagnosis not present

## 2017-06-30 DIAGNOSIS — M25512 Pain in left shoulder: Secondary | ICD-10-CM | POA: Diagnosis not present

## 2017-06-30 DIAGNOSIS — M25612 Stiffness of left shoulder, not elsewhere classified: Secondary | ICD-10-CM

## 2017-06-30 NOTE — Therapy (Signed)
[facility] [address] [place], [place], [address] Phone: [phone]   Fax:  [phone]  Physical Therapy Treatment  Patient Details  Name: Natalie Yoder MRN: [id number] Date of Birth: [birth year] Referring Provider: Duwayne Heck MD.  Encounter Date: 06/30/2017      PT End of Session - 06/30/17 1505    Visit Number 4   Number of Visits 16   Date for PT Re-Evaluation 08/14/17   PT Start Time 1425   PT Stop Time 1512   PT Time Calculation (min) 47 min   Activity Tolerance Patient tolerated treatment well   Behavior During Therapy [facility] for tasks assessed/performed      Past Medical History:  Diagnosis Date  . Diabetes mellitus without complication (HCC)    Type 2  . Fibromyalgia   . History of IBS   . Migraines     Past Surgical History:  Procedure Laterality Date  . SPINE SURGERY     2008, 2011, 2012    There were no vitals filed for this visit.      Subjective Assessment - 06/30/17 1503    Subjective Reports that she had a injection in L shoulder and not feeling any benefits yet.   Pertinent History Cervical surgery.  DM.  Fibromyalgia.  Migraines.   Patient Stated Goals Use arm without pain.   Currently in Pain? Other (Comment)  No pain assessment provided by patient            [facility] PT Assessment - 06/30/17 0001      Assessment   Medical Diagnosis Impingement syndrome of left shoulder.   Next MD Visit 07/18/2017     Restrictions   Weight Bearing Restrictions No                     [facility] Adult PT Treatment/Exercise - 06/30/17 0001      Modalities   Modalities Electrical Stimulation;Ultrasound;Moist Heat     Moist Heat Therapy   Number Minutes Moist Heat 15 Minutes   Moist Heat Location Shoulder     Electrical Stimulation   Electrical Stimulation Location L shoulder   Electrical Stimulation Action IFC   Electrical Stimulation Parameters 1-10 hz x15 min   Electrical Stimulation  Goals Pain;Tone     Ultrasound   Ultrasound Location L acromian ridge   Ultrasound Parameters 1.2 w/cm2 ,100%,1 mhz x10 min   Ultrasound Goals Pain     Manual Therapy   Manual Therapy Passive ROM   Passive ROM PROM of L shoulder gently into flexion, ER with holds at end range                  PT Short Term Goals - 06/15/17 1255      PT SHORT TERM GOAL #1   Title Independent with a HEP.   Time 3   Period Weeks   Status New     PT SHORT TERM GOAL #2   Title Full passive left shoulder PROM.   Time 4   Period Weeks   Status New           PT Long Term Goals - 06/15/17 1255      PT LONG TERM GOAL #1   Title Independent with an advanced HEP.   Time 8   Period Weeks   Status New     PT LONG TERM GOAL #2   Title Sleep 6 hours undisturbed.   Time 8   Period Weeks  Status New     PT LONG TERM GOAL #3   Title Perform ADL's with pain not > 3/10.   Time 8   Period Weeks   Status New     PT LONG TERM GOAL #4   Title Active left shoulder flexion to 145 degrees so the patient can easily reach overhead.   Time 8   Period Weeks   Status New     PT LONG TERM GOAL #5   Title Increase ROM so patient is able to reach behind back to L3 with left hand.   Time 8   Period Weeks   Status New     PT LONG TERM GOAL #6   Title Increase left shoulder strength to a solid 4+/5 to increase stability for performance of functional activities.   Time 8   Period Weeks   Status New               Plan - 06/30/17 1509    Clinical Impression Statement Patient presented in clinic with reports of L shoulder injection with an easy treatment. Normal modalities response noted following removal of the modalities. Patient tolerated gentle PROM of L shoulder better today with no pain reported during PROM session.   Rehab Potential Good   Clinical Impairments Affecting Rehab Potential Multiple co-morbidites.   PT Frequency 2x / week   PT Duration 8 weeks   PT  Treatment/Interventions ADLs/Self Care Home Management;Cryotherapy;Electrical Stimulation;Moist Heat;Ultrasound;Therapeutic activities;Therapeutic exercise;Manual techniques;Patient/family education;Passive range of motion;Vasopneumatic Device   PT Next Visit Plan Please establish a HEP for left shoulder ROM.  Modalites.  Left shoulder PROM.   Consulted and Agree with Plan of Care Patient      Patient will benefit from skilled therapeutic intervention in order to improve the following deficits and impairments:  Decreased activity tolerance, Decreased strength, Decreased range of motion, Pain  Visit Diagnosis: Chronic left shoulder pain  Stiffness of left shoulder joint     Problem List Patient Active Problem List   Diagnosis Date Noted  . Muscle spasm of right shoulder 08/26/2016  . Neck pain 08/26/2016  . Numbness and tingling of right upper extremity 08/26/2016  . Chest discomfort 08/04/2016  . Morbid obesity (HCC) 05/20/2016  . BMI 37.0-37.9, adult 04/29/2016  . Cervical vertebral fusion 04/23/2016  . Postpartum state 10/29/2015  . IBS (irritable bowel syndrome) 01/25/2011  . Hypertension 01/25/2011  . Diabetes mellitus type 2 with complications, uncontrolled (HCC) 01/25/2011  . Migraines 01/25/2011  . Low back pain 01/25/2011    Evelene Croon, PTA 06/30/2017, 3:22 PM  [facility] [address] [place], [place], [address] Phone: [phone]   Fax:  [phone]  Name: Natalie Yoder MRN: [id number] Date of Birth: [birth year]

## 2017-07-04 ENCOUNTER — Encounter: Payer: Self-pay | Admitting: Physical Therapy

## 2017-07-04 ENCOUNTER — Ambulatory Visit: Payer: Medicare Other | Attending: Orthopedic Surgery | Admitting: Physical Therapy

## 2017-07-04 DIAGNOSIS — G8929 Other chronic pain: Secondary | ICD-10-CM | POA: Diagnosis not present

## 2017-07-04 DIAGNOSIS — M25512 Pain in left shoulder: Secondary | ICD-10-CM | POA: Diagnosis not present

## 2017-07-04 DIAGNOSIS — M25612 Stiffness of left shoulder, not elsewhere classified: Secondary | ICD-10-CM | POA: Diagnosis not present

## 2017-07-04 NOTE — Therapy (Signed)
[facility] [address] [place], [place], [address] Phone: [phone]   Fax:  [phone]  Physical Therapy Treatment  Patient Details  Name: Natalie Yoder MRN: [id number] Date of Birth: [birth year] Referring Provider: Duwayne Heck MD.  Encounter Date: 07/04/2017      PT End of Session - 07/04/17 1303    Visit Number 5   Number of Visits 16   Date for PT Re-Evaluation 08/14/17   PT Start Time 1301   PT Stop Time 1346   PT Time Calculation (min) 45 min   Activity Tolerance Patient tolerated treatment well   Behavior During Therapy [facility] for tasks assessed/performed      Past Medical History:  Diagnosis Date  . Diabetes mellitus without complication (HCC)    Type 2  . Fibromyalgia   . History of IBS   . Migraines     Past Surgical History:  Procedure Laterality Date  . SPINE SURGERY     2008, 2011, 2012    There were no vitals filed for this visit.      Subjective Assessment - 07/04/17 1302    Subjective Reports that she had no real relief from injection. Reports on fire feeling yesterday after driving to and from [place].   Pertinent History Cervical surgery.  DM.  Fibromyalgia.  Migraines.   Patient Stated Goals Use arm without pain.   Currently in Pain? Yes   Pain Score 5    Pain Location Shoulder   Pain Orientation Left   Pain Descriptors / Indicators Discomfort   Pain Type Chronic pain   Pain Onset More than a month ago            [facility] PT Assessment - 07/04/17 0001      Assessment   Medical Diagnosis Impingement syndrome of left shoulder.   Next MD Visit 07/18/2017     Restrictions   Weight Bearing Restrictions No                     [facility] Adult PT Treatment/Exercise - 07/04/17 0001      Shoulder Exercises: Isometric Strengthening   Flexion 5X5"   External Rotation 5X5"   Internal Rotation 5X5"     Modalities   Modalities Electrical Stimulation;Ultrasound;Moist Heat     Moist Heat Therapy   Number Minutes Moist Heat 15 Minutes   Moist Heat Location Shoulder     Electrical Stimulation   Electrical Stimulation Location L shoulder   Electrical Stimulation Action IFC   Electrical Stimulation Parameters 1-10 hz x15 min   Electrical Stimulation Goals Pain;Tone     Ultrasound   Ultrasound Location L acromian ridge   Ultrasound Parameters 1.5 w/cm2, 100%, 1 mhz x10 min   Ultrasound Goals Pain     Manual Therapy   Manual Therapy Passive ROM   Passive ROM PROM of L shoulder gently into flexion, ER with holds at end range                  PT Short Term Goals - 06/15/17 1255      PT SHORT TERM GOAL #1   Title Independent with a HEP.   Time 3   Period Weeks   Status New     PT SHORT TERM GOAL #2   Title Full passive left shoulder PROM.   Time 4   Period Weeks   Status New           PT Long Term Goals -  06/15/17 1255      PT LONG TERM GOAL #1   Title Independent with an advanced HEP.   Time 8   Period Weeks   Status New     PT LONG TERM GOAL #2   Title Sleep 6 hours undisturbed.   Time 8   Period Weeks   Status New     PT LONG TERM GOAL #3   Title Perform ADL's with pain not > 3/10.   Time 8   Period Weeks   Status New     PT LONG TERM GOAL #4   Title Active left shoulder flexion to 145 degrees so the patient can easily reach overhead.   Time 8   Period Weeks   Status New     PT LONG TERM GOAL #5   Title Increase ROM so patient is able to reach behind back to L3 with left hand.   Time 8   Period Weeks   Status New     PT LONG TERM GOAL #6   Title Increase left shoulder strength to a solid 4+/5 to increase stability for performance of functional activities.   Time 8   Period Weeks   Status New               Plan - 07/04/17 1335    Clinical Impression Statement Patient presented in clinic with reports of tolerable pain in L shoulder and finding no real relief from recent injection. L shoulder  isometrics were attempted with light reps and holds. Patient experienced "aggravating" sensation in L shoulder following isometrics. Normal [place] response noted following end of [place] session. Patient's L shoulder PROM slightly more limited with flexion than ER. Oscillations completed intermittantly to allow relaxation of L shoulder. Patient experienced LUE fatigue upon end of PROM session such as if she had been throwing a ball repeatedly per patient report. Normal modalities response noted following end of the modalities session.   Rehab Potential Good   Clinical Impairments Affecting Rehab Potential Multiple co-morbidites.   PT Frequency 2x / week   PT Duration 8 weeks   PT Treatment/Interventions ADLs/Self Care Home Management;Cryotherapy;Electrical Stimulation;Moist Heat;Ultrasound;Therapeutic activities;Therapeutic exercise;Manual techniques;Patient/family education;Passive range of motion;Vasopneumatic Device   PT Next Visit Plan Proceed as symptoms dictate per MPT POC.   Consulted and Agree with Plan of Care Patient      Patient will benefit from skilled therapeutic intervention in order to improve the following deficits and impairments:  Decreased activity tolerance, Decreased strength, Decreased range of motion, Pain  Visit Diagnosis: Chronic left shoulder pain  Stiffness of left shoulder joint     Problem List Patient Active Problem List   Diagnosis Date Noted  . Muscle spasm of right shoulder 08/26/2016  . Neck pain 08/26/2016  . Numbness and tingling of right upper extremity 08/26/2016  . Chest discomfort 08/04/2016  . Morbid obesity (HCC) 05/20/2016  . BMI 37.0-37.9, adult 04/29/2016  . Cervical vertebral fusion 04/23/2016  . Postpartum state 10/29/2015  . IBS (irritable bowel syndrome) 01/25/2011  . Hypertension 01/25/2011  . Diabetes mellitus type 2 with complications, uncontrolled (HCC) 01/25/2011  . Migraines 01/25/2011  . Low back pain 01/25/2011    Evelene Croon, PTA 07/04/2017, 1:56 PM  [facility] [address] [place], [place], [address] Phone: [phone]   Fax:  [phone]  Name: Shantelle Alles MRN: [id number] Date of Birth: 07-19-1976

## 2017-07-07 ENCOUNTER — Ambulatory Visit: Payer: Medicare Other | Admitting: Physical Therapy

## 2017-07-07 ENCOUNTER — Encounter: Payer: Self-pay | Admitting: Physical Therapy

## 2017-07-07 DIAGNOSIS — G8929 Other chronic pain: Secondary | ICD-10-CM

## 2017-07-07 DIAGNOSIS — M25512 Pain in left shoulder: Secondary | ICD-10-CM | POA: Diagnosis not present

## 2017-07-07 DIAGNOSIS — M25612 Stiffness of left shoulder, not elsewhere classified: Secondary | ICD-10-CM

## 2017-07-07 NOTE — Therapy (Signed)
[facility] [address] [place], [place], [address] Phone: [phone]   Fax:  [phone]  Physical Therapy Treatment  Patient Details  Name: Natalie Yoder MRN: [id number] Date of Birth: August 28, 1976 Referring Provider: Duwayne Heck MD.  Encounter Date: 07/07/2017      PT End of Session - 07/07/17 1310    Visit Number 6   Number of Visits 16   Date for PT Re-Evaluation 08/14/17   PT Start Time 1305   PT Stop Time 1346   PT Time Calculation (min) 41 min   Activity Tolerance Patient tolerated treatment well   Behavior During Therapy [facility] for tasks assessed/performed      Past Medical History:  Diagnosis Date  . Diabetes mellitus without complication (HCC)    Type 2  . Fibromyalgia   . History of IBS   . Migraines     Past Surgical History:  Procedure Laterality Date  . SPINE SURGERY     2008, 2011, 2012    There were no vitals filed for this visit.      Subjective Assessment - 07/07/17 1308    Subjective Reports that her arm fatigues quickly with even small activities. Reports that she has to rest her shoulder.   Pertinent History Cervical surgery.  DM.  Fibromyalgia.  Migraines.   Patient Stated Goals Use arm without pain.   Currently in Pain? Yes   Pain Score 6    Pain Location Shoulder   Pain Orientation Left   Pain Descriptors / Indicators Discomfort;Tiring   Pain Type Chronic pain   Pain Onset More than a month ago            [facility] PT Assessment - 07/07/17 0001      Assessment   Medical Diagnosis Impingement syndrome of left shoulder.   Next MD Visit 07/18/2017     Restrictions   Weight Bearing Restrictions No                     [facility] Adult PT Treatment/Exercise - 07/07/17 0001      Shoulder Exercises: ROM/Strengthening   UBE (Upper Arm Bike) 120 RPM x3 min     Shoulder Exercises: Isometric Strengthening   Flexion 5X5"   External Rotation 5X5"   Internal Rotation 5X5"      Modalities   Modalities Electrical Stimulation;Ultrasound;Moist Heat     Moist Heat Therapy   Number Minutes Moist Heat 15 Minutes   Moist Heat Location Shoulder     Electrical Stimulation   Electrical Stimulation Location L shoulder   Electrical Stimulation Action PRe-Mod   Electrical Stimulation Parameters 80-150 h x15 min   Electrical Stimulation Goals Pain;Tone     Ultrasound   Ultrasound Location L acromian ridge   Ultrasound Parameters 1.5 w/cm2, 100%, 1 mhz x10 min   Ultrasound Goals Pain     Manual Therapy   Manual Therapy Passive ROM   Passive ROM PROM of L shoulder gently into flexion, ER with holds at end range                  PT Short Term Goals - 06/15/17 1255      PT SHORT TERM GOAL #1   Title Independent with a HEP.   Time 3   Period Weeks   Status New     PT SHORT TERM GOAL #2   Title Full passive left shoulder PROM.   Time 4   Period Weeks   Status  New           PT Long Term Goals - 06/15/17 1255      PT LONG TERM GOAL #1   Title Independent with an advanced HEP.   Time 8   Period Weeks   Status New     PT LONG TERM GOAL #2   Title Sleep 6 hours undisturbed.   Time 8   Period Weeks   Status New     PT LONG TERM GOAL #3   Title Perform ADL's with pain not > 3/10.   Time 8   Period Weeks   Status New     PT LONG TERM GOAL #4   Title Active left shoulder flexion to 145 degrees so the patient can easily reach overhead.   Time 8   Period Weeks   Status New     PT LONG TERM GOAL #5   Title Increase ROM so patient is able to reach behind back to L3 with left hand.   Time 8   Period Weeks   Status New     PT LONG TERM GOAL #6   Title Increase left shoulder strength to a solid 4+/5 to increase stability for performance of functional activities.   Time 8   Period Weeks   Status New               Plan - 07/07/17 1351    Clinical Impression Statement Patient continues to present in clinic with continued L  shoulder aggravation and pain with light exercises and activities at home as well. Gentle ROM with UBE and isometrics caused aggravation and L shoulder fatigue. Normal modalities response noted following end of the modalities session. Patient still initially limited with flexion ROM but improves slowly with continuation of gentle PROM.    Rehab Potential Good   Clinical Impairments Affecting Rehab Potential Multiple co-morbidites.   PT Frequency 2x / week   PT Duration 8 weeks   PT Treatment/Interventions ADLs/Self Care Home Management;Cryotherapy;Electrical Stimulation;Moist Heat;Ultrasound;Therapeutic activities;Therapeutic exercise;Manual techniques;Patient/family education;Passive range of motion;Vasopneumatic Device   PT Next Visit Plan Proceed as symptoms dictate per MPT POC.   Consulted and Agree with Plan of Care Patient      Patient will benefit from skilled therapeutic intervention in order to improve the following deficits and impairments:  Decreased activity tolerance, Decreased strength, Decreased range of motion, Pain  Visit Diagnosis: Chronic left shoulder pain  Stiffness of left shoulder joint     Problem List Patient Active Problem List   Diagnosis Date Noted  . Muscle spasm of right shoulder 08/26/2016  . Neck pain 08/26/2016  . Numbness and tingling of right upper extremity 08/26/2016  . Chest discomfort 08/04/2016  . Morbid obesity (HCC) 05/20/2016  . BMI 37.0-37.9, adult 04/29/2016  . Cervical vertebral fusion 04/23/2016  . Postpartum state 10/29/2015  . IBS (irritable bowel syndrome) 01/25/2011  . Hypertension 01/25/2011  . Diabetes mellitus type 2 with complications, uncontrolled (HCC) 01/25/2011  . Migraines 01/25/2011  . Low back pain 01/25/2011    Evelene Croon, PTA 07/07/2017, 1:57 PM  [facility] [address] [place], [place], [address] Phone: [phone]   Fax:  [phone]  Name: Mossie Gilder MRN: [id number] Date of Birth: Jun 15, 1976

## 2017-07-18 DIAGNOSIS — M24112 Other articular cartilage disorders, left shoulder: Secondary | ICD-10-CM | POA: Diagnosis not present

## 2017-07-18 DIAGNOSIS — M7542 Impingement syndrome of left shoulder: Secondary | ICD-10-CM | POA: Diagnosis not present

## 2017-08-04 DIAGNOSIS — M7542 Impingement syndrome of left shoulder: Secondary | ICD-10-CM | POA: Diagnosis not present

## 2017-08-04 DIAGNOSIS — M24112 Other articular cartilage disorders, left shoulder: Secondary | ICD-10-CM | POA: Diagnosis not present

## 2017-08-04 DIAGNOSIS — M19012 Primary osteoarthritis, left shoulder: Secondary | ICD-10-CM | POA: Diagnosis not present

## 2017-08-04 DIAGNOSIS — G8918 Other acute postprocedural pain: Secondary | ICD-10-CM | POA: Diagnosis not present

## 2017-08-04 DIAGNOSIS — S43492A Other sprain of left shoulder joint, initial encounter: Secondary | ICD-10-CM | POA: Diagnosis not present

## 2017-08-04 DIAGNOSIS — M7502 Adhesive capsulitis of left shoulder: Secondary | ICD-10-CM | POA: Diagnosis not present

## 2017-08-04 DIAGNOSIS — M7522 Bicipital tendinitis, left shoulder: Secondary | ICD-10-CM | POA: Diagnosis not present

## 2017-08-04 HISTORY — PX: SHOULDER ARTHROSCOPY W/ LABRAL REPAIR: SHX2399

## 2017-08-16 ENCOUNTER — Ambulatory Visit: Payer: Medicare Other | Attending: Orthopedic Surgery | Admitting: Physical Therapy

## 2017-08-16 ENCOUNTER — Encounter: Payer: Self-pay | Admitting: Physical Therapy

## 2017-08-16 DIAGNOSIS — G8929 Other chronic pain: Secondary | ICD-10-CM | POA: Diagnosis not present

## 2017-08-16 DIAGNOSIS — M25512 Pain in left shoulder: Secondary | ICD-10-CM | POA: Diagnosis not present

## 2017-08-16 DIAGNOSIS — M25612 Stiffness of left shoulder, not elsewhere classified: Secondary | ICD-10-CM | POA: Insufficient documentation

## 2017-08-16 NOTE — Therapy (Signed)
[facility] [address] [place], [place], [address] Phone: [phone]   Fax:  [phone]  Physical Therapy Evaluation  Patient Details  Name: Fonnie MuGeneva Bunton MRN: [id number] Date of Birth: Nov 06, 1975 Referring Provider: Duwayne HeckJason Rogers, MD   Encounter Date: 08/16/2017  PT End of Session - 08/16/17 1342    Visit Number  1    Number of Visits  16    Date for PT Re-Evaluation  10/16/17    Authorization Type  Pt had 6 visits of therapy prior to her surgery. KX modifier will begin at visit 8, G-codes every 10th visit    PT Start Time  1308    PT Stop Time  1358    PT Time Calculation (min)  50 min    Activity Tolerance  Patient tolerated treatment well    Behavior During Therapy  WFL for tasks assessed/performed       Past Medical History:  Diagnosis Date  . Diabetes mellitus without complication (HCC)    Type 2  . Fibromyalgia   . History of IBS   . Migraines     Past Surgical History:  Procedure Laterality Date  . SPINE SURGERY     2008, 2011, 2012    There were no vitals filed for this visit.   Subjective Assessment - 08/16/17 1318    Subjective  Patient arriving to therpay s/p left shoulder SAD/DCR on 08/04/17. Pt arriving to therapy with no sling. Pt reported that she hasn't been wearing one at all. Pt reporting 8/10 in left shoulder.    Pertinent History  Cervical fusion 2011, cervical surgery 2012,   DM.  Fibromyalgia.  Migraines, lumbar fusion 2008    Patient Stated Goals  Get rid of pain and move my left arm    Currently in Pain?  Yes    Pain Score  8     Pain Location  Shoulder    Pain Orientation  Left    Pain Descriptors / Indicators  Aching;Cramping    Pain Type  Surgical pain    Pain Onset  1 to 4 weeks ago    Pain Frequency  Constant    Aggravating Factors   movement, cold weather    Pain Relieving Factors  pain meds, resting         [facility] PT Assessment - 08/16/17 0001      Assessment   Medical  Diagnosis  s/p left shoulder SAD/DCR    Referring Provider  Duwayne HeckJason Rogers, MD    Next MD Visit  09/09/17      Balance Screen   Has the patient fallen in the past 6 months  No      Home Environment   Living Environment  Private residence    Living Arrangements  Children;Parent    Available Help at Discharge  Family    Type of Home  House    Home Access  Stairs to enter    Entrance Stairs-Number of Steps  4    Entrance Stairs-Rails  Cannot reach both    Home Layout  One level      ROM / Strength   AROM / PROM / Strength  AROM;PROM      AROM   AROM Assessment Site  Shoulder    Right/Left Shoulder  Left    Left Shoulder Extension  5 Degrees    Left Shoulder Flexion  45 Degrees    Left Shoulder ABduction  20 Degrees    Left Shoulder Internal  Rotation  -- to stomach    Left Shoulder External Rotation  30 Degrees      PROM   PROM Assessment Site  Shoulder    Right/Left Shoulder  Left    Left Shoulder Extension  20 Degrees    Left Shoulder Flexion  95 Degrees    Left Shoulder ABduction  40 Degrees    Left Shoulder Internal Rotation  -- to stomach    Left Shoulder External Rotation  45 Degrees      Strength   Overall Strength Comments  Limited by surgical pain             Objective measurements completed on examination: See above findings.      [facility] Adult PT Treatment/Exercise - 08/16/17 0001      Exercises   Exercises  Shoulder      Shoulder Exercises: ROM/Strengthening   Pendulum  circles both directions, front/back, side/side each 30 seconds      Modalities   Modalities  Electrical Stimulation;Vasopneumatic      Electrical Stimulation   Electrical Stimulation Location  L shoulder    Electrical Stimulation Action  IFC    Electrical Stimulation Parameters  80-150 Hz x 15 minutes     Electrical Stimulation Goals  Pain;Tone      Vasopneumatic   Number Minutes Vasopneumatic   15 minutes    Vasopnuematic Location   Shoulder    Vasopneumatic Pressure  Medium     Vasopneumatic Temperature   34             PT Education - 08/16/17 1323    Education provided  Yes    Education Details  HEP    Person(s) Educated  Patient    Methods  Explanation;Demonstration;Handout    Comprehension  Verbalized understanding;Returned demonstration       PT Short Term Goals - 06/15/17 1255      PT SHORT TERM GOAL #1   Title  Independent with a HEP.    Time  3    Period  Weeks    Status  New      PT SHORT TERM GOAL #2   Title  Full passive left shoulder PROM.    Time  4    Period  Weeks    Status  New        PT Long Term Goals - 08/16/17 1416      PT LONG TERM GOAL #1   Title  Independent with an advanced HEP.    Time  8    Period  Weeks    Status  New      PT LONG TERM GOAL #2   Title  Sleep 6 hours undisturbed.    Time  8    Period  Weeks    Status  New      PT LONG TERM GOAL #3   Title  Perform ADL's with pain </=  3/10.    Time  8    Period  Weeks    Status  New      PT LONG TERM GOAL #4   Title  Active left shoulder flexion to 145 degrees so the patient can easily reach overhead.    Time  8    Period  Weeks    Status  New      PT LONG TERM GOAL #5   Title  Increase ROM so patient is able to reach behind back to L3 with left hand.    Period  Weeks    Status  New      PT LONG TERM GOAL #6   Title  Increase left shoulder strength to >/= 4/5 to increase stability for performance of functional activities.    Time  8    Period  Weeks    Status  New             Plan - Aug 19, 2017 1408    Clinical Impression Statement  Patient arriving today as a moderate complexity evaluation s/p L shoulder SAD/DCR on 08/04/17. Pt arriving reporting 8/10 and also stating she is having a trial spinal stimulator placed on 08/19/17 where she will be unable to drive. Pt with limited ROM and increasd pain with all shoulder mobility. Pt was edu in posture and pendulem exercises. E-stim was applied for pain control. Skilled PT needed to  progress pt toward her PLOF.     History and Personal Factors relevant to plan of care:  cervical fusion, cervical surgery following fusion in 2012, Lumbar fusion, DM, Fibromyalgia, Migraines    Clinical Presentation  Stable    Clinical Presentation due to:  See above    Clinical Decision Making  Moderate    Rehab Potential  Good    Clinical Impairments Affecting Rehab Potential  Multiple co-morbidites, trial spinal stimulator to be placed 08/19/17.     PT Frequency  2x / week    PT Duration  8 weeks    PT Treatment/Interventions  ADLs/Self Care Home Management;Cryotherapy;Electrical Stimulation;Moist Heat;Ultrasound;Therapeutic activities;Therapeutic exercise;Manual techniques;Patient/family education;Passive range of motion;Vasopneumatic Device;Taping;Functional mobility training    PT Next Visit Plan  progress SAD/DCR progression, vasopneumatic, modalities to pt's tolerance    PT Home Exercise Plan  pendulums: circle, front/back, side/side    Consulted and Agree with Plan of Care  Patient       Patient will benefit from skilled therapeutic intervention in order to improve the following deficits and impairments:  Postural dysfunction, Decreased range of motion, Decreased activity tolerance, Decreased strength, Pain  Visit Diagnosis: Stiffness of left shoulder joint  Acute pain of left shoulder  Stiffness of left shoulder, not elsewhere classified  G-Codes - 08-19-2017 1419    Functional Assessment Tool Used (Outpatient Only)  FOTO...62% limitation, clinical assessment    Functional Limitation  Carrying, moving and handling objects    Carrying, Moving and Handling Objects Current Status [phone])  At least 60 percent but less than 80 percent impaired, limited or restricted    Carrying, Moving and Handling Objects Goal Status ([id number])  At least 40 percent but less than 60 percent impaired, limited or restricted        Problem List Patient Active Problem List   Diagnosis Date Noted  .  Muscle spasm of right shoulder 08/26/2016  . Neck pain 08/26/2016  . Numbness and tingling of right upper extremity 08/26/2016  . Chest discomfort 08/04/2016  . Morbid obesity (HCC) 05/20/2016  . BMI 37.0-37.9, adult 04/29/2016  . Cervical vertebral fusion 04/23/2016  . Postpartum state 10/29/2015  . IBS (irritable bowel syndrome) 01/25/2011  . Hypertension 01/25/2011  . Diabetes mellitus type 2 with complications, uncontrolled (HCC) 01/25/2011  . Migraines 01/25/2011  . Low back pain 01/25/2011    Sharmon Leyden, MPT August 19, 2017, 2:24 PM  [facility] [address] [place], [place], [address] Phone: [phone]   Fax:  [phone]  Name: Keani Gotcher MRN: [id number] Date of Birth: Nov 02, 1975

## 2017-08-17 ENCOUNTER — Encounter: Payer: Self-pay | Admitting: Physical Therapy

## 2017-08-17 ENCOUNTER — Ambulatory Visit: Payer: Medicare Other | Admitting: Physical Therapy

## 2017-08-17 DIAGNOSIS — M25612 Stiffness of left shoulder, not elsewhere classified: Secondary | ICD-10-CM

## 2017-08-17 DIAGNOSIS — M25512 Pain in left shoulder: Secondary | ICD-10-CM

## 2017-08-17 DIAGNOSIS — G8929 Other chronic pain: Secondary | ICD-10-CM | POA: Diagnosis not present

## 2017-08-17 NOTE — Therapy (Signed)
[facility] [address] [place], [place], [address] Phone: [phone]   Fax:  [phone]  Physical Therapy Treatment  Patient Details  Name: Natalie Yoder MRN: [id number] Date of Birth: 23-Apr-1976 Referring Provider: Duwayne Heck, MD   Encounter Date: 08/17/2017  PT End of Session - 08/17/17 1125    Visit Number  2    Number of Visits  16    Date for PT Re-Evaluation  10/16/17    Authorization Type  Pt had 6 visits of therapy prior to her surgery. KX modifier will begin at visit 8, G-codes every 10th visit    PT Start Time  1125    PT Stop Time  1205    PT Time Calculation (min)  40 min    Activity Tolerance  Patient tolerated treatment well    Behavior During Therapy  WFL for tasks assessed/performed       Past Medical History:  Diagnosis Date  . Diabetes mellitus without complication (HCC)    Type 2  . Fibromyalgia   . History of IBS   . Migraines     Past Surgical History:  Procedure Laterality Date  . SPINE SURGERY     2008, 2011, 2012    There were no vitals filed for this visit.  Subjective Assessment - 08/17/17 1122    Subjective  Reportst that she is very bruised along her L Bicep. Reports she is on bare minimum of pain meds because the meds prescribed from MD she is allergic too. Reports she didn't sleep well last night.    Pertinent History  Cervical fusion 2011, cervical surgery 2012,   DM.  Fibromyalgia.  Migraines, lumbar fusion 2008    Patient Stated Goals  Get rid of pain and move my left arm    Currently in Pain?  Yes    Pain Score  9     Pain Location  Shoulder    Pain Orientation  Left    Pain Descriptors / Indicators  Aching;Discomfort;Other (Comment) Bruised    Pain Onset  1 to 4 weeks ago         [facility] PT Assessment - 08/17/17 0001      Assessment   Medical Diagnosis  s/p left shoulder SAD/DCR    Next MD Visit  09/09/17      Restrictions   Weight Bearing Restrictions  No                   [facility] Adult PT Treatment/Exercise - 08/17/17 0001      Modalities   Modalities  Electrical Stimulation;Cryotherapy      Cryotherapy   Number Minutes Cryotherapy  15 Minutes    Cryotherapy Location  Shoulder    Type of Cryotherapy  Ice pack      Electrical Stimulation   Electrical Stimulation Location  L shoulder    Electrical Stimulation Action  Pre-Mod    Electrical Stimulation Parameters  80-150 hz x15 min    Electrical Stimulation Goals  Pain;Tone      Manual Therapy   Manual Therapy  Passive ROM    Passive ROM  PROM of L shoulder into flexion, ER, IR with oscillations to promote relaxation and pain              PT Education - 08/16/17 1323    Education provided  Yes    Education Details  HEP    Person(s) Educated  Patient    Methods  Explanation;Demonstration;Handout  Comprehension  Verbalized understanding;Returned demonstration       PT Short Term Goals - 06/15/17 1255      PT SHORT TERM GOAL #1   Title  Independent with a HEP.    Time  3    Period  Weeks    Status  New      PT SHORT TERM GOAL #2   Title  Full passive left shoulder PROM.    Time  4    Period  Weeks    Status  New        PT Long Term Goals - 08/16/17 1416      PT LONG TERM GOAL #1   Title  Independent with an advanced HEP.    Time  8    Period  Weeks    Status  New      PT LONG TERM GOAL #2   Title  Sleep 6 hours undisturbed.    Time  8    Period  Weeks    Status  New      PT LONG TERM GOAL #3   Title  Perform ADL's with pain </=  3/10.    Time  8    Period  Weeks    Status  New      PT LONG TERM GOAL #4   Title  Active left shoulder flexion to 145 degrees so the patient can easily reach overhead.    Time  8    Period  Weeks    Status  New      PT LONG TERM GOAL #5   Title  Increase ROM so patient is able to reach behind back to L3 with left hand.    Period  Weeks    Status  New      PT LONG TERM GOAL #6   Title  Increase left  shoulder strength to >/= 4/5 to increase stability for performance of functional activities.    Time  8    Period  Weeks    Status  New            Plan - 08/17/17 1204    Clinical Impression Statement  Patient presented in clinic with increased L shoulder pain as she is unable to take pain medication secondary to allergic reaction as well as lumbar procedure on 08/19/2017. Patient very guarded with PROM initially but increased PROM into flexion was achieved slowly by the end of the PROM session. Only gentle PROM completed in today's treatment with oscillations utilized intermittantly to reduce pain and promote relaxation. Normal modalities response noted following removal of the modalities. Patient did experience a cramping sensation along the superior shoulder just above clavicle region during PROM into flexion.    Rehab Potential  Good    Clinical Impairments Affecting Rehab Potential  Multiple co-morbidites, trial spinal stimulator to be placed 08/19/17.     PT Frequency  2x / week    PT Duration  8 weeks    PT Treatment/Interventions  ADLs/Self Care Home Management;Cryotherapy;Electrical Stimulation;Moist Heat;Ultrasound;Therapeutic activities;Therapeutic exercise;Manual techniques;Patient/family education;Passive range of motion;Vasopneumatic Device;Taping;Functional mobility training    PT Next Visit Plan  progress SAD/DCR progression, vasopneumatic, modalities to pt's tolerance    PT Home Exercise Plan  pendulums: circle, front/back, side/side    Consulted and Agree with Plan of Care  Patient       Patient will benefit from skilled therapeutic intervention in order to improve the following deficits and impairments:  Postural dysfunction, Decreased range of  motion, Decreased activity tolerance, Decreased strength, Pain  Visit Diagnosis: Acute pain of left shoulder  Stiffness of left shoulder joint   G-Codes - 08/16/17 1419    Functional Assessment Tool Used (Outpatient Only)   FOTO...62% limitation, clinical assessment    Functional Limitation  Carrying, moving and handling objects    Carrying, Moving and Handling Objects Current Status [phone](G8984)  At least 60 percent but less than 80 percent impaired, limited or restricted    Carrying, Moving and Handling Objects Goal Status ([id number](G8985)  At least 40 percent but less than 60 percent impaired, limited or restricted       Problem List Patient Active Problem List   Diagnosis Date Noted  . Muscle spasm of right shoulder 08/26/2016  . Neck pain 08/26/2016  . Numbness and tingling of right upper extremity 08/26/2016  . Chest discomfort 08/04/2016  . Morbid obesity (HCC) 05/20/2016  . BMI 37.0-37.9, adult 04/29/2016  . Cervical vertebral fusion 04/23/2016  . Postpartum state 10/29/2015  . IBS (irritable bowel syndrome) 01/25/2011  . Hypertension 01/25/2011  . Diabetes mellitus type 2 with complications, uncontrolled (HCC) 01/25/2011  . Migraines 01/25/2011  . Low back pain 01/25/2011    Natalie Yoder, PTA 08/17/2017, 12:10 PM  [facility] [address] [place], [place], [address] Phone: [phone]   Fax:  [phone]  Name: Natalie Yoder MRN: [id number] Date of Birth: 01-15-1976

## 2017-08-19 DIAGNOSIS — G894 Chronic pain syndrome: Secondary | ICD-10-CM | POA: Diagnosis not present

## 2017-08-19 DIAGNOSIS — M961 Postlaminectomy syndrome, not elsewhere classified: Secondary | ICD-10-CM | POA: Diagnosis not present

## 2017-08-19 DIAGNOSIS — M792 Neuralgia and neuritis, unspecified: Secondary | ICD-10-CM | POA: Diagnosis not present

## 2017-08-19 DIAGNOSIS — M5416 Radiculopathy, lumbar region: Secondary | ICD-10-CM | POA: Diagnosis not present

## 2017-08-19 DIAGNOSIS — G5790 Unspecified mononeuropathy of unspecified lower limb: Secondary | ICD-10-CM | POA: Diagnosis not present

## 2017-08-22 ENCOUNTER — Encounter: Payer: Medicare Other | Admitting: Physical Therapy

## 2017-08-23 ENCOUNTER — Ambulatory Visit: Payer: Medicare Other | Admitting: *Deleted

## 2017-08-23 DIAGNOSIS — G8929 Other chronic pain: Secondary | ICD-10-CM | POA: Diagnosis not present

## 2017-08-23 DIAGNOSIS — M25612 Stiffness of left shoulder, not elsewhere classified: Secondary | ICD-10-CM

## 2017-08-23 DIAGNOSIS — M25512 Pain in left shoulder: Secondary | ICD-10-CM

## 2017-08-23 NOTE — Therapy (Signed)
[facility] [address] [place], [place], [address] Phone: [phone]   Fax:  [phone]  Physical Therapy Treatment  Patient Details  Name: Natalie Yoder MRN: [id number] Date of Birth: 1976/01/15 Referring Provider: Duwayne HeckJason Rogers, MD   Encounter Date: 08/23/2017  PT End of Session - 08/23/17 1600    Visit Number  3    Number of Visits  16    Date for PT Re-Evaluation  10/16/17    Authorization Type  Pt had 6 visits of therapy prior to her surgery. KX modifier will begin at visit 8, G-codes every 10th visit    PT Start Time  1515    PT Stop Time  1605    PT Time Calculation (min)  50 min       Past Medical History:  Diagnosis Date  . Diabetes mellitus without complication (HCC)    Type 2  . Fibromyalgia   . History of IBS   . Migraines     Past Surgical History:  Procedure Laterality Date  . SPINE SURGERY     2008, 2011, 2012    There were no vitals filed for this visit.  Subjective Assessment - 08/23/17 1522    Subjective  LT shldr pain 5/10    Pertinent History  Cervical fusion 2011, cervical surgery 2012,   DM.  Fibromyalgia.  Migraines, lumbar fusion 2008                      [facility] Adult PT Treatment/Exercise - 08/23/17 0001      Exercises   Exercises  Shoulder      Modalities   Modalities  Electrical Stimulation;Cryotherapy;Moist Heat      Moist Heat Therapy   Number Minutes Moist Heat  15 Minutes    Moist Heat Location  Shoulder      Electrical Stimulation   Electrical Stimulation Location  L shoulder  Premod x 15 mins 80-150hz     Electrical Stimulation Goals  Pain;Tone      Manual Therapy   Manual Therapy  Passive ROM    Passive ROM  PROM of L shoulder into flexion, ER, IR with oscillations to promote relaxation and pain                PT Short Term Goals - 06/15/17 1255      PT SHORT TERM GOAL #1   Title  Independent with a HEP.    Time  3    Period  Weeks    Status   New      PT SHORT TERM GOAL #2   Title  Full passive left shoulder PROM.    Time  4    Period  Weeks    Status  New        PT Long Term Goals - 08/16/17 1416      PT LONG TERM GOAL #1   Title  Independent with an advanced HEP.    Time  8    Period  Weeks    Status  New      PT LONG TERM GOAL #2   Title  Sleep 6 hours undisturbed.    Time  8    Period  Weeks    Status  New      PT LONG TERM GOAL #3   Title  Perform ADL's with pain </=  3/10.    Time  8    Period  Weeks    Status  New      PT LONG TERM GOAL #4   Title  Active left shoulder flexion to 145 degrees so the patient can easily reach overhead.    Time  8    Period  Weeks    Status  New      PT LONG TERM GOAL #5   Title  Increase ROM so patient is able to reach behind back to L3 with left hand.    Period  Weeks    Status  New      PT LONG TERM GOAL #6   Title  Increase left shoulder strength to >/= 4/5 to increase stability for performance of functional activities.    Time  8    Period  Weeks    Status  New            Plan - 08/23/17 1601    Clinical Impression Statement  Pt arrived today doing fairly well with minimal shldr pain and her ROM continues to progress. Pt wanted toto Moist heat today with Estim and did well. Decreased muscle guarding with PROM.    Clinical Impairments Affecting Rehab Potential  Multiple co-morbidites, trial spinal stimulator to be placed 08/19/17.     PT Frequency  2x / week    PT Duration  8 weeks    PT Treatment/Interventions  ADLs/Self Care Home Management;Cryotherapy;Electrical Stimulation;Moist Heat;Ultrasound;Therapeutic activities;Therapeutic exercise;Manual techniques;Patient/family education;Passive range of motion;Vasopneumatic Device;Taping;Functional mobility training    PT Next Visit Plan  progress SAD/DCR progression, vasopneumatic, modalities to pt's tolerance    PT Home Exercise Plan  pendulums: circle, front/back, side/side    Consulted and Agree with  Plan of Care  Patient       Patient will benefit from skilled therapeutic intervention in order to improve the following deficits and impairments:  Postural dysfunction, Decreased range of motion, Decreased activity tolerance, Decreased strength, Pain  Visit Diagnosis: Acute pain of left shoulder  Stiffness of left shoulder joint  Stiffness of left shoulder, not elsewhere classified  Chronic left shoulder pain     Problem List Patient Active Problem List   Diagnosis Date Noted  . Muscle spasm of right shoulder 08/26/2016  . Neck pain 08/26/2016  . Numbness and tingling of right upper extremity 08/26/2016  . Chest discomfort 08/04/2016  . Morbid obesity (HCC) 05/20/2016  . BMI 37.0-37.9, adult 04/29/2016  . Cervical vertebral fusion 04/23/2016  . Postpartum state 10/29/2015  . IBS (irritable bowel syndrome) 01/25/2011  . Hypertension 01/25/2011  . Diabetes mellitus type 2 with complications, uncontrolled (HCC) 01/25/2011  . Migraines 01/25/2011  . Low back pain 01/25/2011    RAMSEUR,CHRIS, PTA 08/23/2017, 4:08 PM  [facility] [address] [place], [place], [address] Phone: [phone]   Fax:  [phone]  Name: Natalie Yoder MRN: [id number] Date of Birth: 14-Jul-1976

## 2017-08-30 ENCOUNTER — Encounter: Payer: Self-pay | Admitting: Physical Therapy

## 2017-08-30 ENCOUNTER — Ambulatory Visit: Payer: Medicare Other | Admitting: Physical Therapy

## 2017-08-30 DIAGNOSIS — M25512 Pain in left shoulder: Secondary | ICD-10-CM

## 2017-08-30 DIAGNOSIS — G8929 Other chronic pain: Secondary | ICD-10-CM | POA: Diagnosis not present

## 2017-08-30 DIAGNOSIS — M25612 Stiffness of left shoulder, not elsewhere classified: Secondary | ICD-10-CM

## 2017-08-30 NOTE — Therapy (Signed)
[facility] [address] [place], [place], [address] Phone: [phone]   Fax:  [phone]  Physical Therapy Treatment  Patient Details  Name: Natalie Yoder MRN: [id number] Date of Birth: [birth year] Referring Provider: Duwayne HeckJason Rogers, MD   Encounter Date: 08/30/2017  PT End of Session - 08/30/17 1522    Visit Number  4    Number of Visits  16    Date for PT Re-Evaluation  10/16/17    Authorization Type  Pt had 6 visits of therapy prior to her surgery. KX modifier will begin at visit 8, G-codes every 10th visit    PT Start Time  1438    PT Stop Time  1520    PT Time Calculation (min)  42 min    Activity Tolerance  Patient tolerated treatment well    Behavior During Therapy  WFL for tasks assessed/performed       Past Medical History:  Diagnosis Date  . Diabetes mellitus without complication (HCC)    Type 2  . Fibromyalgia   . History of IBS   . Migraines     Past Surgical History:  Procedure Laterality Date  . SPINE SURGERY     2008, 2011, 2012    There were no vitals filed for this visit.  Subjective Assessment - 08/30/17 1506    Subjective  Reports that she continues to have pain along the lateral L clavicular region. Reports that she does try to lift small items and knows she cannot lift a gallon of milk.    Pertinent History  Cervical fusion 2011, cervical surgery 2012,   DM.  Fibromyalgia.  Migraines, lumbar fusion 2008    Patient Stated Goals  Get rid of pain and move my left arm    Currently in Pain?  Yes    Pain Score  6     Pain Location  Shoulder    Pain Orientation  Left;Anterior    Pain Descriptors / Indicators  Aching;Sore    Pain Type  Surgical pain    Pain Onset  1 to 4 weeks ago         [facility] PT Assessment - 08/30/17 0001      Assessment   Medical Diagnosis  s/p left shoulder SAD/DCR    Onset Date/Surgical Date  08/04/17    Next MD Visit  09/09/17      Restrictions   Weight Bearing  Restrictions  No                  [facility] Adult PT Treatment/Exercise - 08/30/17 0001      Modalities   Modalities  Electrical Stimulation;Moist Heat      Moist Heat Therapy   Number Minutes Moist Heat  15 Minutes    Moist Heat Location  Shoulder      Electrical Stimulation   Electrical Stimulation Location  L shoulder    Electrical Stimulation Action  Pre-Mod    Electrical Stimulation Parameters  80-150 hz x15 min    Electrical Stimulation Goals  Pain      Manual Therapy   Manual Therapy  Passive ROM    Passive ROM  PROM of L shoulder into flexion, ER, IR with oscillations to promote relaxation and pain                PT Short Term Goals - 06/15/17 1255      PT SHORT TERM GOAL #1   Title  Independent with a HEP.  Time  3    Period  Weeks    Status  New      PT SHORT TERM GOAL #2   Title  Full passive left shoulder PROM.    Time  4    Period  Weeks    Status  New        PT Long Term Goals - 08/16/17 1416      PT LONG TERM GOAL #1   Title  Independent with an advanced HEP.    Time  8    Period  Weeks    Status  New      PT LONG TERM GOAL #2   Title  Sleep 6 hours undisturbed.    Time  8    Period  Weeks    Status  New      PT LONG TERM GOAL #3   Title  Perform ADL's with pain </=  3/10.    Time  8    Period  Weeks    Status  New      PT LONG TERM GOAL #4   Title  Active left shoulder flexion to 145 degrees so the patient can easily reach overhead.    Time  8    Period  Weeks    Status  New      PT LONG TERM GOAL #5   Title  Increase ROM so patient is able to reach behind back to L3 with left hand.    Period  Weeks    Status  New      PT LONG TERM GOAL #6   Title  Increase left shoulder strength to >/= 4/5 to increase stability for performance of functional activities.    Time  8    Period  Weeks    Status  New            Plan - 08/30/17 1532    Clinical Impression Statement  Patient presented in clinic today with  continued anterior L shoulder ache/soreness especially at distant end of the L clavicle. Smooth arc of motion still noted today along with firm end feels in all directions. Oscillations required very intermittantly today. Normal modalities response noted following removal of the modalities.    Rehab Potential  Good    Clinical Impairments Affecting Rehab Potential  Multiple co-morbidites, trial spinal stimulator to be placed 08/19/17.     PT Frequency  2x / week    PT Duration  8 weeks    PT Treatment/Interventions  ADLs/Self Care Home Management;Cryotherapy;Electrical Stimulation;Moist Heat;Ultrasound;Therapeutic activities;Therapeutic exercise;Manual techniques;Patient/family education;Passive range of motion;Vasopneumatic Device;Taping;Functional mobility training    PT Next Visit Plan  progress SAD/DCR progression, vasopneumatic, modalities to pt's tolerance    PT Home Exercise Plan  pendulums: circle, front/back, side/side    Consulted and Agree with Plan of Care  Patient       Patient will benefit from skilled therapeutic intervention in order to improve the following deficits and impairments:  Postural dysfunction, Decreased range of motion, Decreased activity tolerance, Decreased strength, Pain  Visit Diagnosis: Acute pain of left shoulder  Stiffness of left shoulder joint     Problem List Patient Active Problem List   Diagnosis Date Noted  . Muscle spasm of right shoulder 08/26/2016  . Neck pain 08/26/2016  . Numbness and tingling of right upper extremity 08/26/2016  . Chest discomfort 08/04/2016  . Morbid obesity (HCC) 05/20/2016  . BMI 37.0-37.9, adult 04/29/2016  . Cervical vertebral fusion 04/23/2016  .  Postpartum state 10/29/2015  . IBS (irritable bowel syndrome) 01/25/2011  . Hypertension 01/25/2011  . Diabetes mellitus type 2 with complications, uncontrolled (HCC) 01/25/2011  . Migraines 01/25/2011  . Low back pain 01/25/2011    Marvell FullerKelsey P Yailin Biederman,  PTA 08/30/2017, 3:35 PM  [facility] [address] [place], [place], [address] Phone: [phone]   Fax:  [phone]  Name: Natalie Yoder MRN: [id number] Date of Birth: August 22, 1976

## 2017-08-31 ENCOUNTER — Ambulatory Visit: Payer: Medicare Other | Admitting: Physical Therapy

## 2017-08-31 ENCOUNTER — Encounter: Payer: Self-pay | Admitting: Physical Therapy

## 2017-08-31 DIAGNOSIS — G8929 Other chronic pain: Secondary | ICD-10-CM | POA: Diagnosis not present

## 2017-08-31 DIAGNOSIS — M25512 Pain in left shoulder: Secondary | ICD-10-CM | POA: Diagnosis not present

## 2017-08-31 DIAGNOSIS — M25612 Stiffness of left shoulder, not elsewhere classified: Secondary | ICD-10-CM

## 2017-08-31 NOTE — Therapy (Signed)
[facility] [address] [place], [place], [address] Phone: [phone]   Fax:  [phone]  Physical Therapy Treatment  Patient Details  Name: Natalie Yoder MRN: [id number] Date of Birth: May 16, 1976 Referring Provider: Duwayne HeckJason Rogers, MD   Encounter Date: 08/31/2017  PT End of Session - 08/31/17 1438    Visit Number  5    Number of Visits  16    Date for PT Re-Evaluation  10/16/17    Authorization Type  Pt had 6 visits of therapy prior to her surgery. KX modifier will begin at visit 8, G-codes every 10th visit    PT Start Time  1400    PT Stop Time  1444    PT Time Calculation (min)  44 min    Activity Tolerance  Patient tolerated treatment well    Behavior During Therapy  WFL for tasks assessed/performed       Past Medical History:  Diagnosis Date  . Diabetes mellitus without complication (HCC)    Type 2  . Fibromyalgia   . History of IBS   . Migraines     Past Surgical History:  Procedure Laterality Date  . SPINE SURGERY     2008, 2011, 2012    There were no vitals filed for this visit.  Subjective Assessment - 08/31/17 1405    Subjective  Patient reported ongoing discomfort    Pertinent History  Cervical fusion 2011, cervical surgery 2012,   DM.  Fibromyalgia.  Migraines, lumbar fusion 2008    Patient Stated Goals  Get rid of pain and move my left arm    Currently in Pain?  Yes    Pain Score  6     Pain Location  Shoulder    Pain Orientation  Left    Pain Descriptors / Indicators  Aching;Sore    Pain Type  Surgical pain    Pain Onset  1 to 4 weeks ago    Aggravating Factors   movement    Pain Relieving Factors  rest and meds                      [facility] Adult PT Treatment/Exercise - 08/31/17 0001      Moist Heat Therapy   Number Minutes Moist Heat  15 Minutes    Moist Heat Location  Shoulder      Electrical Stimulation   Electrical Stimulation Location  L shoulder    Electrical Stimulation  Action  premod    Electrical Stimulation Parameters  80-150hz  x6115min    Electrical Stimulation Goals  Pain      Manual Therapy   Manual Therapy  Passive ROM    Passive ROM  PROM of L shoulder into flexion, ER, IR with oscillations to promote relaxation and pain                PT Short Term Goals - 06/15/17 1255      PT SHORT TERM GOAL #1   Title  Independent with a HEP.    Time  3    Period  Weeks    Status  New      PT SHORT TERM GOAL #2   Title  Full passive left shoulder PROM.    Time  4    Period  Weeks    Status  New        PT Long Term Goals - 08/16/17 1416      PT LONG TERM GOAL #1  Title  Independent with an advanced HEP.    Time  8    Period  Weeks    Status  New      PT LONG TERM GOAL #2   Title  Sleep 6 hours undisturbed.    Time  8    Period  Weeks    Status  New      PT LONG TERM GOAL #3   Title  Perform ADL's with pain </=  3/10.    Time  8    Period  Weeks    Status  New      PT LONG TERM GOAL #4   Title  Active left shoulder flexion to 145 degrees so the patient can easily reach overhead.    Time  8    Period  Weeks    Status  New      PT LONG TERM GOAL #5   Title  Increase ROM so patient is able to reach behind back to L3 with left hand.    Period  Weeks    Status  New      PT LONG TERM GOAL #6   Title  Increase left shoulder strength to >/= 4/5 to increase stability for performance of functional activities.    Time  8    Period  Weeks    Status  New            Plan - 08/31/17 1439    Clinical Impression Statement  Patient tolerated treatment well today. Patient has some ongoing discomfort yet no increased pain with PROM. Patient has some stiffness and guarding with ROM in shoulder. Patient improving with ROM in left shoulder. Goals progressing yet ongoing at this time,     Rehab Potential  Good    Clinical Impairments Affecting Rehab Potential  Multiple co-morbidites, trial spinal stimulator to be placed 08/19/17.      PT Frequency  2x / week    PT Duration  8 weeks    PT Treatment/Interventions  ADLs/Self Care Home Management;Cryotherapy;Electrical Stimulation;Moist Heat;Ultrasound;Therapeutic activities;Therapeutic exercise;Manual techniques;Patient/family education;Passive range of motion;Vasopneumatic Device;Taping;Functional mobility training    PT Next Visit Plan  progress SAD/DCR progression, vasopneumatic, modalities to pt's tolerance    Consulted and Agree with Plan of Care  Patient       Patient will benefit from skilled therapeutic intervention in order to improve the following deficits and impairments:  Postural dysfunction, Decreased range of motion, Decreased activity tolerance, Decreased strength, Pain  Visit Diagnosis: Acute pain of left shoulder  Stiffness of left shoulder joint  Stiffness of left shoulder, not elsewhere classified     Problem List Patient Active Problem List   Diagnosis Date Noted  . Muscle spasm of right shoulder 08/26/2016  . Neck pain 08/26/2016  . Numbness and tingling of right upper extremity 08/26/2016  . Chest discomfort 08/04/2016  . Morbid obesity (HCC) 05/20/2016  . BMI 37.0-37.9, adult 04/29/2016  . Cervical vertebral fusion 04/23/2016  . Postpartum state 10/29/2015  . IBS (irritable bowel syndrome) 01/25/2011  . Hypertension 01/25/2011  . Diabetes mellitus type 2 with complications, uncontrolled (HCC) 01/25/2011  . Migraines 01/25/2011  . Low back pain 01/25/2011    Hermelinda DellenDUNFORD, Mikenna Bunkley P, PTA 08/31/2017, 2:47 PM  [facility] [address] [place], [place], [address] Phone: [phone]   Fax:  [phone]  Name: Natalie MuGeneva Coaxum MRN: [id number] Date of Birth: [birth year]

## 2017-09-05 DIAGNOSIS — M5136 Other intervertebral disc degeneration, lumbar region: Secondary | ICD-10-CM | POA: Diagnosis not present

## 2017-09-06 ENCOUNTER — Ambulatory Visit: Payer: Medicare Other | Attending: Orthopedic Surgery | Admitting: Physical Therapy

## 2017-09-06 ENCOUNTER — Encounter: Payer: Self-pay | Admitting: Physical Therapy

## 2017-09-06 DIAGNOSIS — M25612 Stiffness of left shoulder, not elsewhere classified: Secondary | ICD-10-CM | POA: Diagnosis not present

## 2017-09-06 DIAGNOSIS — M25512 Pain in left shoulder: Secondary | ICD-10-CM | POA: Diagnosis not present

## 2017-09-06 NOTE — Therapy (Signed)
[facility] [address] [place], [place], [address] Phone: [phone]   Fax:  [phone]  Physical Therapy Treatment  Patient Details  Name: Natalie Yoder MRN: [id number] Date of Birth: 13-Feb-1976 Referring Provider: Duwayne HeckJason Rogers, MD   Encounter Date: 09/06/2017  PT End of Session - 09/06/17 1433    Visit Number  6    Number of Visits  16    Date for PT Re-Evaluation  10/16/17    Authorization Type  Pt had 6 visits of therapy prior to her surgery. KX modifier will begin at visit 8, G-codes every 10th visit    PT Start Time  1433    PT Stop Time  1518    PT Time Calculation (min)  45 min    Activity Tolerance  Patient tolerated treatment well    Behavior During Therapy  [facility] HospitalWFL for tasks assessed/performed       Past Medical History:  Diagnosis Date  . Diabetes mellitus without complication (HCC)    Type 2  . Fibromyalgia   . History of IBS   . Migraines     Past Surgical History:  Procedure Laterality Date  . SPINE SURGERY     2008, 2011, 2012    There were no vitals filed for this visit.  Subjective Assessment - 09/06/17 1432    Subjective  Reports that the cold weather is probably affecting her shoulder.    Pertinent History  Cervical fusion 2011, cervical surgery 2012,   DM.  Fibromyalgia.  Migraines, lumbar fusion 2008    Patient Stated Goals  Get rid of pain and move my left arm    Currently in Pain?  Yes    Pain Score  4     Pain Location  Shoulder    Pain Orientation  Left    Pain Descriptors / Indicators  Aching    Pain Type  Surgical pain    Pain Onset  1 to 4 weeks ago         [facility] - 09/06/17 0001      Assessment   Medical Diagnosis  s/p left shoulder SAD/DCR    Onset Date/Surgical Date  08/04/17    Next MD Visit  09/09/17      Restrictions   Weight Bearing Restrictions  No      ROM / Strength   AROM / PROM / Strength  PROM      PROM   Overall PROM   Deficits    PROM Assessment  Site  Shoulder    Right/Left Shoulder  Left    Left Shoulder Flexion  127 Degrees    Left Shoulder Internal Rotation  73 Degrees    Left Shoulder External Rotation  50 Degrees                  [facility] Adult PT Treatment/Exercise - 09/06/17 0001      Modalities   Modalities  Electrical Stimulation;Moist Heat      Moist Heat Therapy   Number Minutes Moist Heat  15 Minutes    Moist Heat Location  Shoulder      Electrical Stimulation   Electrical Stimulation Location  L shoulder    Electrical Stimulation Action  Pre-Mod    Electrical Stimulation Parameters  80-150 hz x15 min    Electrical Stimulation Goals  Pain      Manual Therapy   Manual Therapy  Passive ROM    Passive ROM  PROM of L shoulder  into flexion, ER, IR with oscillations to promote relaxation and pain                PT Short Term Goals - 06/15/17 1255      PT SHORT TERM GOAL #1   Title  Independent with a HEP.    Time  3    Period  Weeks    Status  New      PT SHORT TERM GOAL #2   Title  Full passive left shoulder PROM.    Time  4    Period  Weeks    Status  New        PT Long Term Goals - 08/16/17 1416      PT LONG TERM GOAL #1   Title  Independent with an advanced HEP.    Time  8    Period  Weeks    Status  New      PT LONG TERM GOAL #2   Title  Sleep 6 hours undisturbed.    Time  8    Period  Weeks    Status  New      PT LONG TERM GOAL #3   Title  Perform ADL's with pain </=  3/10.    Time  8    Period  Weeks    Status  New      PT LONG TERM GOAL #4   Title  Active left shoulder flexion to 145 degrees so the patient can easily reach overhead.    Time  8    Period  Weeks    Status  New      PT LONG TERM GOAL #5   Title  Increase ROM so patient is able to reach behind back to L3 with left hand.    Period  Weeks    Status  New      PT LONG TERM GOAL #6   Title  Increase left shoulder strength to >/= 4/5 to increase stability for performance of functional  activities.    Time  8    Period  Weeks    Status  New            Plan - 09/06/17 1516    Clinical Impression Statement  Patient tolerated today's treatment fairly well although she arrived with L shoulder discomfort. Firm end feels and smooth arc of motion noted with all directions of PROM assessed. PROM measurements have improved as listed in today's note but were painful at end  range per patient report. Normal modalities response noted following removal of modalities.    Rehab Potential  Good    Clinical Impairments Affecting Rehab Potential  Multiple co-morbidites, trial spinal stimulator to be placed 08/19/17.     PT Frequency  2x / week    PT Duration  8 weeks    PT Treatment/Interventions  ADLs/Self Care Home Management;Cryotherapy;Electrical Stimulation;Moist Heat;Ultrasound;Therapeutic activities;Therapeutic exercise;Manual techniques;Patient/family education;Passive range of motion;Vasopneumatic Device;Taping;Functional mobility training    PT Next Visit Plan  progress SAD/DCR progression, vasopneumatic, modalities to pt's tolerance    PT Home Exercise Plan  pendulums: circle, front/back, side/side    Consulted and Agree with Plan of Care  Patient       Patient will benefit from skilled therapeutic intervention in order to improve the following deficits and impairments:  Postural dysfunction, Decreased range of motion, Decreased activity tolerance, Decreased strength, Pain  Visit Diagnosis: Acute pain of left shoulder  Stiffness of left shoulder joint  Problem List Patient Active Problem List   Diagnosis Date Noted  . Muscle spasm of right shoulder 08/26/2016  . Neck pain 08/26/2016  . Numbness and tingling of right upper extremity 08/26/2016  . Chest discomfort 08/04/2016  . Morbid obesity (HCC) 05/20/2016  . BMI 37.0-37.9, adult 04/29/2016  . Cervical vertebral fusion 04/23/2016  . Postpartum state 10/29/2015  . IBS (irritable bowel syndrome) 01/25/2011   . Hypertension 01/25/2011  . Diabetes mellitus type 2 with complications, uncontrolled (HCC) 01/25/2011  . Migraines 01/25/2011  . Low back pain 01/25/2011    Marvell FullerKelsey P Mayar Whittier, PTA 09/06/2017, 3:43 PM  [facility] [address] [place], [place], [address] Phone: [phone]   Fax:  [phone]  Name: Natalie Yoder MRN: [id number] Date of Birth: [birth year]

## 2017-09-07 DIAGNOSIS — G894 Chronic pain syndrome: Secondary | ICD-10-CM | POA: Diagnosis not present

## 2017-09-07 DIAGNOSIS — M961 Postlaminectomy syndrome, not elsewhere classified: Secondary | ICD-10-CM | POA: Diagnosis not present

## 2017-09-07 DIAGNOSIS — M5136 Other intervertebral disc degeneration, lumbar region: Secondary | ICD-10-CM | POA: Diagnosis not present

## 2017-09-08 ENCOUNTER — Ambulatory Visit: Payer: Medicare Other | Admitting: Physical Therapy

## 2017-09-08 ENCOUNTER — Encounter: Payer: Self-pay | Admitting: Physical Therapy

## 2017-09-08 DIAGNOSIS — M25512 Pain in left shoulder: Secondary | ICD-10-CM | POA: Diagnosis not present

## 2017-09-08 DIAGNOSIS — M25612 Stiffness of left shoulder, not elsewhere classified: Secondary | ICD-10-CM

## 2017-09-08 NOTE — Therapy (Signed)
[facility] [address] [place], [place], [address] Phone: [phone]   Fax:  [phone]  Physical Therapy Treatment  Patient Details  Name: Natalie Yoder MRN: [id number] Date of Birth: 1975/11/08 Referring Provider: Duwayne HeckJason Rogers, MD   Encounter Date: 09/08/2017  PT End of Session - 09/08/17 1432    Visit Number  7    Number of Visits  16    Date for PT Re-Evaluation  10/16/17    Authorization Type  Pt had 6 visits of therapy prior to her surgery. KX modifier will begin at visit 8, G-codes every 10th visit    PT Start Time  1430    PT Stop Time  1517    PT Time Calculation (min)  47 min    Activity Tolerance  Patient tolerated treatment well    Behavior During Therapy  WFL for tasks assessed/performed       Past Medical History:  Diagnosis Date  . Diabetes mellitus without complication (HCC)    Type 2  . Fibromyalgia   . History of IBS   . Migraines     Past Surgical History:  Procedure Laterality Date  . SPINE SURGERY     2008, 2011, 2012    There were no vitals filed for this visit.  Subjective Assessment - 09/08/17 1430    Subjective  Reports that her shoulder is okay today.    Pertinent History  Cervical fusion 2011, cervical surgery 2012,   DM.  Fibromyalgia.  Migraines, lumbar fusion 2008    Patient Stated Goals  Get rid of pain and move my left arm    Currently in Pain?  Yes    Pain Score  6     Pain Location  Shoulder    Pain Orientation  Left    Pain Descriptors / Indicators  Discomfort    Pain Type  Surgical pain    Pain Onset  1 to 4 weeks ago         [facility] 09[facility]08[facility]17 0001      Assessment   Medical Diagnosis  s/p left shoulder SAD/DCR    Onset Date/Surgical Date  08/04/17    Next MD Visit  09/09/17      Restrictions   Weight Bearing Restrictions  No      ROM / Strength   AROM / PROM / Strength  PROM      PROM   Overall PROM   Deficits    PROM Assessment Site  Shoulder    Right/Left Shoulder  Left    Left Shoulder Flexion  113 Degrees    Left Shoulder Internal Rotation  70 Degrees    Left Shoulder External Rotation  50 Degrees                  [facility] Adult PT Treatment/Exercise - 09/08/17 0001      Shoulder Exercises: Supine   Protraction  AAROM;Both;20 reps    External Rotation  AAROM;Left;20 reps    Flexion  AAROM;Both;20 reps to approx. 105 deg flexion to pain      Shoulder Exercises: Pulleys   Flexion  Other (comment) x5 min    Other Pulley Exercises  LUE ranger seated into flex/circles x20 reps      Modalities   Modalities  Electrical Stimulation;Moist Heat      Moist Heat Therapy   Number Minutes Moist Heat  15 Minutes    Moist Heat Location  Shoulder  Programme researcher, broadcasting/film/videolectrical Stimulation   Electrical Stimulation Location  L shoulder    Electrical Stimulation Action  Pre-Mod    Electrical Stimulation Parameters  80-150 hz x15 min    Electrical Stimulation Goals  Pain      Manual Therapy   Manual Therapy  Passive ROM    Passive ROM  PROM of L shoulder into flexion, ER, IR with oscillations to promote relaxation and pain                PT Short Term Goals - 06/15/17 1255      PT SHORT TERM GOAL #1   Title  Independent with a HEP.    Time  3    Period  Weeks    Status  New      PT SHORT TERM GOAL #2   Title  Full passive left shoulder PROM.    Time  4    Period  Weeks    Status  New        PT Long Term Goals - 08/16/17 1416      PT LONG TERM GOAL #1   Title  Independent with an advanced HEP.    Time  8    Period  Weeks    Status  New      PT LONG TERM GOAL #2   Title  Sleep 6 hours undisturbed.    Time  8    Period  Weeks    Status  New      PT LONG TERM GOAL #3   Title  Perform ADL's with pain </=  3/10.    Time  8    Period  Weeks    Status  New      PT LONG TERM GOAL #4   Title  Active left shoulder flexion to 145 degrees so the patient can easily reach overhead.    Time  8    Period  Weeks     Status  New      PT LONG TERM GOAL #5   Title  Increase ROM so patient is able to reach behind back to L3 with left hand.    Period  Weeks    Status  New      PT LONG TERM GOAL #6   Title  Increase left shoulder strength to >/= 4/5 to increase stability for performance of functional activities.    Time  8    Period  Weeks    Status  New            Plan - 09/08/17 1502    Clinical Impression Statement  Patient arrived to treatment with increased pain in the L shoulder as well as reports of difficulty sleeping last night. Patient introduced to gentle AAROM exercises with reports of fatigue in shoulder with AAROM exercises. Patient limited with both PROM and AAROM flexion today at approximately 105 degress secondary to sharp stabbing pain in anterior shoulder region. PROM measurements listed today with rotation measurements similar to previous treatments but flexion less today secondary to pain. Goals remain on-going secondary to pain as well as ROM and strength deficits. Normal modalities response noted following removal of the modalities.    Rehab Potential  Good    Clinical Impairments Affecting Rehab Potential  Multiple co-morbidites, trial spinal stimulator to be placed 08/19/17.     PT Frequency  2x / week    PT Duration  8 weeks    PT Treatment/Interventions  ADLs/Self Care Home Management;Cryotherapy;Lobbyistlectrical  Stimulation;Moist Heat;Ultrasound;Therapeutic activities;Therapeutic exercise;Manual techniques;Patient/family education;Passive range of motion;Vasopneumatic Device;Taping;Functional mobility training    PT Next Visit Plan  progress SAD/DCR progression, vasopneumatic, modalities to pt's tolerance    PT Home Exercise Plan  pendulums: circle, front/back, side/side    Consulted and Agree with Plan of Care  Patient       Patient will benefit from skilled therapeutic intervention in order to improve the following deficits and impairments:  Postural dysfunction, Decreased  range of motion, Decreased activity tolerance, Decreased strength, Pain  Visit Diagnosis: Acute pain of left shoulder  Stiffness of left shoulder joint     Problem List Patient Active Problem List   Diagnosis Date Noted  . Muscle spasm of right shoulder 08/26/2016  . Neck pain 08/26/2016  . Numbness and tingling of right upper extremity 08/26/2016  . Chest discomfort 08/04/2016  . Morbid obesity (HCC) 05/20/2016  . BMI 37.0-37.9, adult 04/29/2016  . Cervical vertebral fusion 04/23/2016  . Postpartum state 10/29/2015  . IBS (irritable bowel syndrome) 01/25/2011  . Hypertension 01/25/2011  . Diabetes mellitus type 2 with complications, uncontrolled (HCC) 01/25/2011  . Migraines 01/25/2011  . Low back pain 01/25/2011   Marvell Fuller, PTA 09/08/17 3:21 PM   [facility] [address] [place], [place], [address] Phone: [phone]   Fax:  [phone]  Name: Natalie Yoder MRN: [id number] Date of Birth: 1976/09/10

## 2017-09-14 ENCOUNTER — Ambulatory Visit: Payer: Medicare Other

## 2017-09-15 ENCOUNTER — Ambulatory Visit (INDEPENDENT_AMBULATORY_CARE_PROVIDER_SITE_OTHER): Payer: Medicare Other | Admitting: Pediatrics

## 2017-09-15 ENCOUNTER — Encounter: Payer: Self-pay | Admitting: Pediatrics

## 2017-09-15 VITALS — BP 137/83 | HR 87 | Temp 98.3°F | Ht 66.0 in | Wt 228.6 lb

## 2017-09-15 DIAGNOSIS — Z794 Long term (current) use of insulin: Secondary | ICD-10-CM | POA: Diagnosis not present

## 2017-09-15 DIAGNOSIS — IMO0002 Reserved for concepts with insufficient information to code with codable children: Secondary | ICD-10-CM

## 2017-09-15 DIAGNOSIS — E119 Type 2 diabetes mellitus without complications: Secondary | ICD-10-CM | POA: Diagnosis not present

## 2017-09-15 DIAGNOSIS — E1165 Type 2 diabetes mellitus with hyperglycemia: Secondary | ICD-10-CM

## 2017-09-15 DIAGNOSIS — E041 Nontoxic single thyroid nodule: Secondary | ICD-10-CM | POA: Diagnosis not present

## 2017-09-15 DIAGNOSIS — E118 Type 2 diabetes mellitus with unspecified complications: Secondary | ICD-10-CM

## 2017-09-15 LAB — BAYER DCA HB A1C WAIVED: HB A1C: 9.9 % — AB (ref <7.0)

## 2017-09-15 MED ORDER — EMPAGLIFLOZIN 25 MG PO TABS: 25.0000 mg | ORAL_TABLET | Freq: Every day | ORAL | 2 refills | Status: DC

## 2017-09-15 NOTE — Progress Notes (Signed)
  Subjective:   Patient ID: Natalie Yoder, female    DOB: 1975-10-23, 41 y.o.   MRN: [id number] CC: Surgical clearance  HPI: Natalie Yoder is a 41 y.o. female presenting for Surgical clearance For spine stimulator placement  DM2: Has not been seen in over a year  taking metformin and jardiance that has not had dry neutrophils in over a year per chart review Rarely checking BGLs, not snacking Says sometimes blood sugars get into the 50s in the morning Boiled eggs or oatmeal for breakfast, sandwich for lunch dinner heavy in vegetables with a protein Avoiding sodas  Had shoulder surgery in Nov 2018 Nov 16 had spinal cord stimulator placed as a trial, helped a lot with pain Going to have permanent stimulator placed next month No SOB, no CP  Can walk 5-10 min before back pain starts now Think she was able to do more exercise with a spinal stimulator and is looking forward to it getting placed  Incidental finding of 1.6 cm thyroid nodule left thyroid lobe on recent MRI of her spine  Relevant past medical, surgical, family and social history reviewed. Allergies and medications reviewed and updated. Social History   Tobacco Use  Smoking Status Never Smoker  Smokeless Tobacco Never Used   ROS: Per HPI   Objective:    BP 137/83   Pulse 87   Temp 98.3 F (36.8 C) (Oral)   Ht 5' 6" (1.676 m)   Wt 228 lb 9.6 oz (103.7 kg)   BMI 36.90 kg/m   Wt Readings from Last 3 Encounters:  09/15/17 228 lb 9.6 oz (103.7 kg)  08/24/16 236 lb (107 kg)  08/15/16 238 lb (108 kg)    Gen: NAD, alert, cooperative with exam, NCAT EYES: EOMI, no conjunctival injection, or no icterus ENT:  TMs pearly gray b/l, OP without erythema LYMPH: no cervical LAD CV: NRRR, normal S1/S2, no murmur, distal pulses 2+ b/l Resp: CTABL, no wheezes, normal WOB Abd: +BS, soft, NTND.  Ext: No edema, warm Neuro: Alert and oriented  Assessment & Plan:  Natalie Yoder was seen today for surgical clearance, scheduled in 1-2  months. We will try to improve blood sugars were prior to surgery.  Diagnoses and all orders for this visit:  Diabetes mellitus without complication (HCC) V3K 9.9, uncontrolled We will increase Jardiance dose, patient to check morning and night, follow-up in 2-3 weeks Will need to start additional medicine, likely GLP-1 Check blood sugars every morning Bring to next clinic visit -     CMP14+EGFR -     Natalie Yoder Hb A1c Waived -     TSH  Thyroid nodule No -     Ambulatory referral to Endocrinology   Follow up plan: 3 weeks Assunta Found, MD [place]

## 2017-09-16 LAB — CMP14+EGFR
A/G RATIO: 1.8 (ref 1.2–2.2)
ALT: 22 IU/L (ref 0–32)
AST: 22 IU/L (ref 0–40)
Albumin: 4.6 g/dL (ref 3.5–5.5)
Alkaline Phosphatase: 74 IU/L (ref 39–117)
BILIRUBIN TOTAL: 0.3 mg/dL (ref 0.0–1.2)
BUN/Creatinine Ratio: 16 (ref 9–23)
BUN: 10 mg/dL (ref 6–24)
CHLORIDE: 106 mmol/L (ref 96–106)
CO2: 22 mmol/L (ref 20–29)
Calcium: 9.8 mg/dL (ref 8.7–10.2)
Creatinine, Ser: 0.61 mg/dL (ref 0.57–1.00)
GFR, EST AFRICAN AMERICAN: 130 mL/min/{1.73_m2} (ref >59)
GFR, EST NON AFRICAN AMERICAN: 113 mL/min/{1.73_m2} (ref >59)
Globulin, Total: 2.6 g/dL (ref 1.5–4.5)
Glucose: 100 mg/dL — ABNORMAL HIGH (ref 65–99)
POTASSIUM: 4.5 mmol/L (ref 3.5–5.2)
Sodium: 140 mmol/L (ref 134–144)
Total Protein: 7.2 g/dL (ref 6.0–8.5)

## 2017-09-16 LAB — TSH: TSH: 0.642 u[IU]/mL (ref 0.450–4.500)

## 2017-09-22 ENCOUNTER — Encounter: Payer: Self-pay | Admitting: Pediatrics

## 2017-09-22 ENCOUNTER — Ambulatory Visit (INDEPENDENT_AMBULATORY_CARE_PROVIDER_SITE_OTHER): Payer: Medicare Other | Admitting: Pediatrics

## 2017-09-22 VITALS — BP 135/84 | HR 87 | Temp 98.2°F | Ht 66.0 in | Wt 222.4 lb

## 2017-09-22 DIAGNOSIS — E1165 Type 2 diabetes mellitus with hyperglycemia: Secondary | ICD-10-CM | POA: Diagnosis not present

## 2017-09-22 DIAGNOSIS — IMO0002 Reserved for concepts with insufficient information to code with codable children: Secondary | ICD-10-CM

## 2017-09-22 DIAGNOSIS — E119 Type 2 diabetes mellitus without complications: Secondary | ICD-10-CM | POA: Diagnosis not present

## 2017-09-22 DIAGNOSIS — Z794 Long term (current) use of insulin: Secondary | ICD-10-CM

## 2017-09-22 DIAGNOSIS — E118 Type 2 diabetes mellitus with unspecified complications: Secondary | ICD-10-CM

## 2017-09-22 DIAGNOSIS — J069 Acute upper respiratory infection, unspecified: Secondary | ICD-10-CM

## 2017-09-22 MED ORDER — EMPAGLIFLOZIN 10 MG PO TABS: 10.0000 mg | ORAL_TABLET | Freq: Every day | ORAL | 3 refills | Status: DC

## 2017-09-22 MED ORDER — METFORMIN HCL 500 MG PO TABS: 500.0000 mg | ORAL_TABLET | Freq: Two times a day (BID) | ORAL | 3 refills | Status: DC

## 2017-09-22 NOTE — Progress Notes (Signed)
  Subjective:   Patient ID: Natalie Yoder, female    DOB: [birth year], 41 y.o.   MRN: [id number] CC: Follow-up (Blood Sugar)  HPI: Natalie Yoder is a 41 y.o. female presenting for Follow-up (Blood Sugar)  Has surgery scheduled for next month to place a spinal stimulator A1c last visit elevated at 9.9  Blood sugar numbers in the morning range from 90s up to 110 Before dinner in the low 100s, not higher than 120  Says she has been taking her medicines regularly She has has had several courses of prednisone or steroid shots through her back doctor given ongoing pain and shoulder problems  No she also has URI symptoms, started 2 days ago, some scratchy throat, coughing No fevers Appetite has been normal Congestion bothering her the most Toddler at home is also sick  Relevant past medical, surgical, family and social history reviewed. Allergies and medications reviewed and updated. Social History   Tobacco Use  Smoking Status Never Smoker  Smokeless Tobacco Never Used   ROS: Per HPI   Objective:    BP 135/84   Pulse 87   Temp 98.2 F (36.8 C) (Oral)   Ht 5\' 6"  (1.676 m)   Wt 222 lb 6.4 oz (100.9 kg)   BMI 35.90 kg/m   Wt Readings from Last 3 Encounters:  09/22/17 222 lb 6.4 oz (100.9 kg)  09/15/17 228 lb 9.6 oz (103.7 kg)  08/24/16 236 lb (107 kg)    Gen: NAD, alert, cooperative with exam, NCAT EYES: EOMI, no conjunctival injection, or no icterus ENT:  TMs pearly gray b/l, OP with mild erythema LYMPH: no cervical LAD CV: NRRR, normal S1/S2, no murmur, distal pulses 2+ b/l Resp: CTABL, no wheezes, normal WOB Abd: +BS, soft, NTND. no guarding or organomegaly Ext: No edema, warm, normal foot exam b/l Neuro: Alert and oriented, strength equal b/l UE and LE, coordination grossly normal MSK: normal muscle bulk   Assessment & Plan:  Natalie Yoder was seen today for follow-up multiple med problems  Diagnoses and all orders for this visit:  Acute URI Symptom care discussed,  return precautions discussed  Diabetes mellitus without complication (HCC) -     Microalbumin / creatinine urine ratio  Uncontrolled type 2 diabetes mellitus with complication, with long-term current use of insulin (HCC) A1c elevated last visit but not consistent with blood sugar levels over the last week Possible due to steroid use for her ongoing back pain Discussed surgical clearance with patient, will send paperwork in to her back surgeon Continue below medicines Continue to check blood sugar levels twice a day Follow-up in 3 months Due for eye exam -     metFORMIN (GLUCOPHAGE) 500 MG tablet; Take 1 tablet (500 mg total) by mouth 2 (two) times daily with a meal. -     empagliflozin (JARDIANCE) 10 MG TABS tablet; Take 10 mg by mouth daily.   Follow up plan: 3 mo Rex Krasarol Deaven Urwin, MD [facility]

## 2017-09-29 ENCOUNTER — Encounter: Payer: Self-pay | Admitting: *Deleted

## 2017-10-10 NOTE — H&P (Addendum)
Patient ID: Natalie Yoder MRN: [id number] DOB/AGE: 1976/02/12 42 y.o.  Admit date:  10/13/2017  Admission Diagnoses:  Chronic pain syndrome Hx of a lumbar fusion in 2008  HPI: Very pleasant 42 year old female patient past medical history significant for lumbar fusion back in 2008 unfortunately since that time she continues to have chronic back pain and intermittent radicular pain into her right lower extremity as well as weakness.  Patient had a very successful spinal cord stimulator trial.  We will move forward with permanent placement of a spinal cord stimulator.  The patient reports a history of diabetes her last A1c was at 8.8 her provider did clear her for the surgical intervention.  Past Medical History: Past Medical History:  Diagnosis Date  . Diabetes mellitus without complication (HCC)    Type 2  . Fibromyalgia   . History of IBS   . Migraines     Surgical History: Past Surgical History:  Procedure Laterality Date  . SPINE SURGERY     2008, 2011, 2012    Family History: Family History  Problem Relation Age of Onset  . Diabetes Mother   . Hypertension Mother   . Diabetes Father   . Hypertension Father   . Hyperlipidemia Brother   . Hypertension Brother     Social History: Social History   Socioeconomic History  . Marital status: Single    Spouse name: Not on file  . Number of children: Not on file  . Years of education: Not on file  . Highest education level: Not on file  Social Needs  . Financial resource strain: Not on file  . Food insecurity - worry: Not on file  . Food insecurity - inability: Not on file  . Transportation needs - medical: Not on file  . Transportation needs - non-medical: Not on file  Occupational History  . Not on file  Tobacco Use  . Smoking status: Never Smoker  . Smokeless tobacco: Never Used  Substance and Sexual Activity  . Alcohol use: No    Comment: rarely  . Drug use: No  . Sexual activity: Yes    Birth  control/protection: None  Other Topics Concern  . Not on file  Social History Narrative  . Not on file    Allergies: Fentanyl; Hydrocodone; Methocarbamol; Penicillins; Adhesive [tape]; Oxycodone hcl; and Exenatide  Medications: I have reviewed the patient's current medications.  Vital Signs: No data found.  Radiology: No results found.  Labs: No results for input(s): WBC, RBC, HCT, PLT in the last 72 hours. No results for input(s): NA, K, CL, CO2, BUN, CREATININE, GLUCOSE, CALCIUM in the last 72 hours. No results for input(s): LABPT, INR in the last 72 hours.  Review of Systems: ROS  Physical Exam: There is no height or weight on file to calculate BMI. Patient does have a healed surgical incision from lumbar fusion 2008 Lungs are clear to auscultation,  regular rate and rhythm of the pts heart, abdomen is soft and nontender.  2+ pulses bilaterally sensation intact bilaterally.  No straight leg raise tenderness to palpation noted on the right no strength deficits patient does elicits elicit pain when ambulating on toes or heels range of motion elicits pain with flexion as well as extension no assistive devices are needed   Assessment and Plan: Patient reports allergies to narcotics we will trial Dilaudid in patient.  Patient will follow up in 2 weeks postop.  All questions were elicited and answered. Goals of surgery were discussed  surgery is to reduce pain and improve function quality-of-life.  Patient is aware that despite all appropriate treatment at pain and function could be the same worse or different     Natalie Yoder, PAC for Natalie Lickahari [name], MD [facility] [phone]  Thoracic MRI showed no significant cord compression, stenosis, disc herniation that would prohibit implantation of the spinal cord stimulator paddle.  Risks and benefits of surgery were discussed with the patient. These include: Infection, bleeding, death, stroke, paralysis, ongoing or  worse pain, need for additional surgery, leak of spinal fluid, Failure of the battery requiring reoperation. Inability to place the paddle requiring the surgery to be aborted. Migration of the lead, failure to obtain results similar to the trial.  No significant change in the patient's clinical exam.  Risks and benefits were reviewed again with the patient.  All of her questions were encouraged and addressed.  Plan to move forward with the spinal cord stimulator placement, and most likely discharge in the morning.

## 2017-10-11 ENCOUNTER — Other Ambulatory Visit: Payer: Self-pay

## 2017-10-11 ENCOUNTER — Encounter (HOSPITAL_COMMUNITY)
Admission: RE | Admit: 2017-10-11 | Discharge: 2017-10-11 | Disposition: A | Discharge status: Home / Self Care | Payer: Medicare Other | Source: Ambulatory Visit | Attending: Orthopedic Surgery | Admitting: Orthopedic Surgery

## 2017-10-11 ENCOUNTER — Other Ambulatory Visit (HOSPITAL_COMMUNITY): Payer: Self-pay | Admitting: *Deleted

## 2017-10-11 ENCOUNTER — Encounter (HOSPITAL_COMMUNITY): Payer: Self-pay

## 2017-10-11 DIAGNOSIS — G894 Chronic pain syndrome: Secondary | ICD-10-CM | POA: Diagnosis not present

## 2017-10-11 DIAGNOSIS — E119 Type 2 diabetes mellitus without complications: Secondary | ICD-10-CM | POA: Diagnosis not present

## 2017-10-11 DIAGNOSIS — Z79899 Other long term (current) drug therapy: Secondary | ICD-10-CM | POA: Diagnosis not present

## 2017-10-11 DIAGNOSIS — Z79891 Long term (current) use of opiate analgesic: Secondary | ICD-10-CM | POA: Diagnosis not present

## 2017-10-11 DIAGNOSIS — Z981 Arthrodesis status: Secondary | ICD-10-CM | POA: Diagnosis not present

## 2017-10-11 DIAGNOSIS — Z7984 Long term (current) use of oral hypoglycemic drugs: Secondary | ICD-10-CM | POA: Diagnosis not present

## 2017-10-11 DIAGNOSIS — Z6835 Body mass index (BMI) 35.0-35.9, adult: Secondary | ICD-10-CM | POA: Diagnosis not present

## 2017-10-11 DIAGNOSIS — M797 Fibromyalgia: Secondary | ICD-10-CM | POA: Diagnosis not present

## 2017-10-11 DIAGNOSIS — J45909 Unspecified asthma, uncomplicated: Secondary | ICD-10-CM | POA: Diagnosis not present

## 2017-10-11 HISTORY — DX: Drug induced constipation: K59.03

## 2017-10-11 HISTORY — DX: Nontoxic single thyroid nodule: E04.1

## 2017-10-11 HISTORY — DX: Unspecified asthma, uncomplicated: J45.909

## 2017-10-11 HISTORY — DX: Unspecified osteoarthritis, unspecified site: M19.90

## 2017-10-11 HISTORY — DX: Gastro-esophageal reflux disease without esophagitis: K21.9

## 2017-10-11 HISTORY — DX: Anxiety disorder, unspecified: F41.9

## 2017-10-11 LAB — BASIC METABOLIC PANEL
Anion gap: 12 (ref 5–15)
BUN: 11 mg/dL (ref 6–20)
CHLORIDE: 105 mmol/L (ref 101–111)
CO2: 20 mmol/L — ABNORMAL LOW (ref 22–32)
CREATININE: 0.7 mg/dL (ref 0.44–1.00)
Calcium: 9.7 mg/dL (ref 8.9–10.3)
GFR calc Af Amer: >60 mL/min (ref >60)
GFR calc non Af Amer: >60 mL/min (ref >60)
Glucose, Bld: 112 mg/dL — ABNORMAL HIGH (ref 65–99)
Potassium: 3.9 mmol/L (ref 3.5–5.1)
SODIUM: 137 mmol/L (ref 135–145)

## 2017-10-11 LAB — HCG, SERUM, QUALITATIVE: PREG SERUM: NEGATIVE

## 2017-10-11 LAB — GLUCOSE, CAPILLARY: Glucose-Capillary: 108 mg/dL — ABNORMAL HIGH (ref 65–99)

## 2017-10-11 NOTE — Pre-Procedure Instructions (Signed)
Natalie Yoder  10/11/2017    Your procedure is scheduled on Thursday, October 13, 2017 at 1:00 PM.   [facility] at 11:00 AM.   Call this number if you have problems the morning of surgery: [phone]   Questions prior to day of surgery, please call [phone] between 8 & 4 PM.   Remember:  Do not eat food or drink liquids after midnight Wednesday, 10/12/17.  Take these medicines the morning of surgery with A SIP OF WATER: Baclofen (Lioresal) - if needed, Tramadol - if needed, Albuterol inhaler - if needed (bring inhaler with you day of surgery)  Do not take Metformin (Glucophage) morning of surgery.   Stop Multivitamins, NSAIDS (Ibuprofen, Aleve, etc), Aspirin products (Excedrin Migraine, etc) and Herbal medications as of today.   How to Manage Your Diabetes Before Surgery   Why is it important to control my blood sugar before and after surgery?   Improving blood sugar levels before and after surgery helps healing and can limit problems.  A way of improving blood sugar control is eating a healthy diet by:  - Eating less sugar and carbohydrates  - Increasing activity/exercise  - Talk with your doctor about reaching your blood sugar goals  High blood sugars (greater than 180 mg/dL) can raise your risk of infections and slow down your recovery so you will need to focus on controlling your diabetes during the weeks before surgery.  Make sure that the doctor who takes care of your diabetes knows about your planned surgery including the date and location.  How do I manage my blood sugars before surgery?   Check your blood sugar at least 4 times a day, 2 days before surgery to make sure that they are not too high or low.  Check your blood sugar the morning of your surgery when you wake up and every 2 hours until you get to the Short-Stay unit.  Treat a low blood sugar (less than 70 mg/dL) with 1/2 cup of clear juice (cranberry or  apple), 4 glucose tablets, OR glucose gel.  Recheck blood sugar in 15 minutes after treatment (to make sure it is greater than 70 mg/dL).  If blood sugar is not greater than 70 mg/dL on re-check, call [phone] for further instructions.   Report your blood sugar to the Short-Stay nurse when you get to Short-Stay.  References:  [facility], 2007 "How to Manage your Diabetes Before and After Surgery".   Do not wear jewelry, make-up or nail polish.  Do not wear lotions, powders, perfumes or deodorant.  Do not shave 48 hours prior to surgery.    Do not bring valuables to the hospital.  [facility] is not responsible for any belongings or valuables.  Contacts, dentures or bridgework may not be worn into surgery.  Leave your suitcase in the car.  After surgery it may be brought to your room.  For patients admitted to the hospital, discharge time will be determined by your treatment team.  Patients discharged the day of surgery will not be allowed to drive home.   [facility] - Preparing for Surgery  Before surgery, you can play an important role.  Because skin is not sterile, your skin needs to be as free of germs as possible.  You can reduce the number of germs on you skin by washing with CHG (chlorahexidine gluconate) soap before surgery.  CHG is an antiseptic cleaner which kills germs  and bonds with the skin to continue killing germs even after washing.  Please DO NOT use if you have an allergy to CHG or antibacterial soaps.  If your skin becomes reddened/irritated stop using the CHG and inform your nurse when you arrive at Short Stay.  Do not shave (including legs and underarms) for at least 48 hours prior to the first CHG shower.  You may shave your face.  Please follow these instructions carefully:   1.  Shower with CHG Soap the night before surgery and the                    morning of Surgery.  2.  If you choose to wash your hair, wash your hair first  as usual with your       normal shampoo.  3.  After you shampoo, rinse your hair and body thoroughly to remove the shampoo.  4.  Use CHG as you would any other liquid soap.  You can apply chg directly       to the skin and wash gently with scrungie or a clean washcloth.  5.  Apply the CHG Soap to your body ONLY FROM THE NECK DOWN.        Do not use on open wounds or open sores.  Avoid contact with your eyes, ears, mouth and genitals (private parts).  Wash genitals (private parts) with your normal soap.  6.  Wash thoroughly, paying special attention to the area where your surgery        will be performed.  7.  Thoroughly rinse your body with warm water from the neck down.  8.  DO NOT shower/wash with your normal soap after using and rinsing off       the CHG Soap.  9.  Pat yourself dry with a clean towel.            10.  Wear clean pajamas.            11.  Place clean sheets on your bed the night of your first shower and do not        sleep with pets.  Day of Surgery  Do not apply any lotions/deoderants the morning of surgery.  Please wear clean clothes to the hospital/surgery center.   Please read over the fact sheets that you were given.

## 2017-10-11 NOTE — Progress Notes (Signed)
Pt denies cardiac history, chest pain or sob. Pt is a type 2 diabetic. She states her last A1C was in December and it was 9.9. Pt states her fasting blood sugar is usually between 70-115.

## 2017-10-12 NOTE — Progress Notes (Signed)
Anesthesia Chart Review:  Pt is a 42 year old female scheduled for lumbar spinal cord stimulator insertion on 10/13/2017 with Natalie Lickahari Brooks, MD  - PCP is Natalie Krasarol Vincent, MD  PMH includes:  DM, asthma, thyroid nodule, GERD.  Never smoker. BMI 35  Medications include: albuterol, jardiance, metformin  BP (!) 143/76   Pulse 97   Temp 36.7 C   Resp 20   Ht 5\' 6"  (1.676 m)   Wt 218 lb 3.2 oz (99 kg)   LMP 09/19/2017   SpO2 99%   BMI 35.22 kg/m   Preoperative labs reviewed.   - glucose 108. HbA1c 9.9 on 09/15/17 - Office visit note 09/22/17 indicates she suspects elevated A1c due to steroid use for ongoing back pain as it is not consistent with home glucose monitoring records  EKG 10/11/17: NSR  If no changes, I anticipate pt can proceed with surgery as scheduled.   Natalie Mastngela Chizaram Latino, FNP-BC [facility]esthesiology Phone: [phone] 10/12/2017 9:58 AM

## 2017-10-13 ENCOUNTER — Other Ambulatory Visit: Payer: Self-pay

## 2017-10-13 ENCOUNTER — Ambulatory Visit (HOSPITAL_COMMUNITY): Payer: Medicare Other | Admitting: Anesthesiology

## 2017-10-13 ENCOUNTER — Ambulatory Visit (HOSPITAL_COMMUNITY): Payer: Medicare Other

## 2017-10-13 ENCOUNTER — Observation Stay (HOSPITAL_COMMUNITY)
Admission: RE | Admit: 2017-10-13 | Discharge: 2017-10-14 | Disposition: A | Discharge status: Home / Self Care | Payer: Medicare Other | Source: Ambulatory Visit | Attending: Orthopedic Surgery | Admitting: Orthopedic Surgery

## 2017-10-13 ENCOUNTER — Ambulatory Visit (HOSPITAL_COMMUNITY): Payer: Medicare Other | Admitting: Emergency Medicine

## 2017-10-13 ENCOUNTER — Encounter (HOSPITAL_COMMUNITY)
Admission: RE | Disposition: A | Discharge status: Home / Self Care | Payer: Self-pay | Source: Ambulatory Visit | Attending: Orthopedic Surgery

## 2017-10-13 ENCOUNTER — Encounter (HOSPITAL_COMMUNITY): Payer: Self-pay

## 2017-10-13 DIAGNOSIS — M4185 Other forms of scoliosis, thoracolumbar region: Secondary | ICD-10-CM | POA: Diagnosis not present

## 2017-10-13 DIAGNOSIS — M797 Fibromyalgia: Secondary | ICD-10-CM | POA: Diagnosis not present

## 2017-10-13 DIAGNOSIS — Z7984 Long term (current) use of oral hypoglycemic drugs: Secondary | ICD-10-CM | POA: Diagnosis not present

## 2017-10-13 DIAGNOSIS — Z981 Arthrodesis status: Secondary | ICD-10-CM | POA: Insufficient documentation

## 2017-10-13 DIAGNOSIS — J45909 Unspecified asthma, uncomplicated: Secondary | ICD-10-CM | POA: Diagnosis not present

## 2017-10-13 DIAGNOSIS — Z79899 Other long term (current) drug therapy: Secondary | ICD-10-CM | POA: Diagnosis not present

## 2017-10-13 DIAGNOSIS — M5416 Radiculopathy, lumbar region: Secondary | ICD-10-CM | POA: Diagnosis not present

## 2017-10-13 DIAGNOSIS — Z79891 Long term (current) use of opiate analgesic: Secondary | ICD-10-CM | POA: Insufficient documentation

## 2017-10-13 DIAGNOSIS — M961 Postlaminectomy syndrome, not elsewhere classified: Secondary | ICD-10-CM | POA: Diagnosis not present

## 2017-10-13 DIAGNOSIS — E119 Type 2 diabetes mellitus without complications: Secondary | ICD-10-CM | POA: Diagnosis not present

## 2017-10-13 DIAGNOSIS — Z419 Encounter for procedure for purposes other than remedying health state, unspecified: Secondary | ICD-10-CM

## 2017-10-13 DIAGNOSIS — Z9889 Other specified postprocedural states: Secondary | ICD-10-CM

## 2017-10-13 DIAGNOSIS — Z6835 Body mass index (BMI) 35.0-35.9, adult: Secondary | ICD-10-CM | POA: Insufficient documentation

## 2017-10-13 DIAGNOSIS — G894 Chronic pain syndrome: Principal | ICD-10-CM | POA: Insufficient documentation

## 2017-10-13 DIAGNOSIS — M5136 Other intervertebral disc degeneration, lumbar region: Secondary | ICD-10-CM | POA: Diagnosis not present

## 2017-10-13 DIAGNOSIS — Z9689 Presence of other specified functional implants: Secondary | ICD-10-CM

## 2017-10-13 HISTORY — PX: SPINAL CORD STIMULATOR INSERTION: SHX5378

## 2017-10-13 HISTORY — DX: Chronic pain syndrome: G89.4

## 2017-10-13 LAB — GLUCOSE, CAPILLARY
GLUCOSE-CAPILLARY: 76 mg/dL (ref 65–99)
Glucose-Capillary: 90 mg/dL (ref 65–99)
Glucose-Capillary: 64 mg/dL — ABNORMAL LOW (ref 65–99)
Glucose-Capillary: 122 mg/dL — ABNORMAL HIGH (ref 65–99)
Glucose-Capillary: 79 mg/dL (ref 65–99)
Glucose-Capillary: 121 mg/dL — ABNORMAL HIGH (ref 65–99)
Glucose-Capillary: 131 mg/dL — ABNORMAL HIGH (ref 65–99)

## 2017-10-13 SURGERY — INSERTION, SPINAL CORD STIMULATOR, LUMBAR
Anesthesia: General | Site: Spine Lumbar

## 2017-10-13 MED ORDER — LACTATED RINGERS IV SOLN
INTRAVENOUS | Status: DC
  Administered 2017-10-13 (×2): via INTRAVENOUS

## 2017-10-13 MED ORDER — HYDROMORPHONE HCL 2 MG PO TABS: 2.0000 mg | ORAL_TABLET | ORAL | 0 refills | Status: DC | PRN

## 2017-10-13 MED ORDER — SUGAMMADEX SODIUM 200 MG/2ML IV SOLN
INTRAVENOUS | Status: DC | PRN
  Administered 2017-10-13: 200 mg via INTRAVENOUS

## 2017-10-13 MED ORDER — HYDROMORPHONE HCL 2 MG PO TABS
2.0000 mg | ORAL_TABLET | ORAL | Status: DC | PRN
  Administered 2017-10-14: 2 mg via ORAL
  Filled 2017-10-13: qty 1

## 2017-10-13 MED ORDER — DEXTROSE 50 % IV SOLN
25.0000 mL | Freq: Once | INTRAVENOUS | Status: AC
  Administered 2017-10-13: 25 mL via INTRAVENOUS
  Filled 2017-10-13: qty 50

## 2017-10-13 MED ORDER — DEXAMETHASONE SODIUM PHOSPHATE 10 MG/ML IJ SOLN
INTRAMUSCULAR | Status: DC | PRN
  Administered 2017-10-13: 4 mg via INTRAVENOUS

## 2017-10-13 MED ORDER — MIDAZOLAM HCL 2 MG/2ML IJ SOLN
INTRAMUSCULAR | Status: AC
  Filled 2017-10-13: qty 2

## 2017-10-13 MED ORDER — SODIUM CHLORIDE 0.9% FLUSH
3.0000 mL | Freq: Two times a day (BID) | INTRAVENOUS | Status: DC
  Administered 2017-10-14: 3 mL via INTRAVENOUS

## 2017-10-13 MED ORDER — HYDROMORPHONE HCL 1 MG/ML IJ SOLN
INTRAMUSCULAR | Status: AC
  Filled 2017-10-13: qty 1

## 2017-10-13 MED ORDER — ACETAMINOPHEN 325 MG PO TABS: 650.0000 mg | ORAL_TABLET | ORAL | Status: DC | PRN

## 2017-10-13 MED ORDER — FENTANYL CITRATE (PF) 250 MCG/5ML IJ SOLN
INTRAMUSCULAR | Status: AC
  Filled 2017-10-13: qty 5

## 2017-10-13 MED ORDER — PROPOFOL 10 MG/ML IV BOLUS
INTRAVENOUS | Status: DC | PRN
  Administered 2017-10-13: 200 mg via INTRAVENOUS
  Administered 2017-10-13: 30 mg via INTRAVENOUS
  Administered 2017-10-13: 20 mg via INTRAVENOUS

## 2017-10-13 MED ORDER — POLYETHYLENE GLYCOL 3350 17 G PO PACK
17.0000 g | PACK | Freq: Every day | ORAL | Status: DC | PRN
Start: 2017-10-13 — End: 2017-10-14

## 2017-10-13 MED ORDER — HYDROMORPHONE HCL 2 MG PO TABS: 4.0000 mg | ORAL_TABLET | ORAL | Status: DC | PRN

## 2017-10-13 MED ORDER — VANCOMYCIN HCL IN DEXTROSE 1-5 GM/200ML-% IV SOLN
1000.0000 mg | INTRAVENOUS | Status: AC
  Administered 2017-10-13: 1000 mg via INTRAVENOUS
  Filled 2017-10-13 (×2): qty 200

## 2017-10-13 MED ORDER — CYCLOBENZAPRINE HCL 10 MG PO TABS
ORAL_TABLET | ORAL | Status: AC
  Filled 2017-10-13: qty 1

## 2017-10-13 MED ORDER — ONDANSETRON 4 MG PO TBDP: 4.0000 mg | ORAL_TABLET | Freq: Three times a day (TID) | ORAL | 0 refills | Status: DC | PRN

## 2017-10-13 MED ORDER — PROPOFOL 10 MG/ML IV BOLUS
INTRAVENOUS | Status: AC
  Filled 2017-10-13: qty 20

## 2017-10-13 MED ORDER — METHOCARBAMOL 500 MG PO TABS: 500.0000 mg | ORAL_TABLET | Freq: Four times a day (QID) | ORAL | Status: DC | PRN

## 2017-10-13 MED ORDER — 0.9 % SODIUM CHLORIDE (POUR BTL) OPTIME
TOPICAL | Status: DC | PRN
  Administered 2017-10-13: 1000 mL

## 2017-10-13 MED ORDER — CANAGLIFLOZIN 100 MG PO TABS: 100.0000 mg | ORAL_TABLET | Freq: Every day | ORAL | Status: DC

## 2017-10-13 MED ORDER — ACETAMINOPHEN 500 MG PO TABS
1000.0000 mg | ORAL_TABLET | Freq: Once | ORAL | Status: AC
  Administered 2017-10-13: 1000 mg via ORAL
  Filled 2017-10-13: qty 2

## 2017-10-13 MED ORDER — ONDANSETRON HCL 4 MG/2ML IJ SOLN: 4.0000 mg | Freq: Four times a day (QID) | INTRAMUSCULAR | Status: DC | PRN

## 2017-10-13 MED ORDER — HYDROMORPHONE HCL 1 MG/ML IJ SOLN
INTRAMUSCULAR | Status: AC
  Filled 2017-10-13: qty 0.5

## 2017-10-13 MED ORDER — HYDROMORPHONE HCL 1 MG/ML IJ SOLN
0.2500 mg | INTRAMUSCULAR | Status: DC | PRN
  Administered 2017-10-13 (×4): 0.5 mg via INTRAVENOUS

## 2017-10-13 MED ORDER — DIPHENHYDRAMINE HCL 25 MG PO CAPS
25.0000 mg | ORAL_CAPSULE | Freq: Four times a day (QID) | ORAL | Status: DC | PRN
  Administered 2017-10-13: 25 mg via ORAL
  Filled 2017-10-13: qty 1

## 2017-10-13 MED ORDER — ONDANSETRON HCL 4 MG/2ML IJ SOLN
INTRAMUSCULAR | Status: DC | PRN
  Administered 2017-10-13: 4 mg via INTRAVENOUS

## 2017-10-13 MED ORDER — METHOCARBAMOL 1000 MG/10ML IJ SOLN: 500.0000 mg | Freq: Four times a day (QID) | INTRAVENOUS | Status: DC | PRN

## 2017-10-13 MED ORDER — LIDOCAINE 2% (20 MG/ML) 5 ML SYRINGE
INTRAMUSCULAR | Status: DC | PRN
  Administered 2017-10-13: 100 mg via INTRAVENOUS

## 2017-10-13 MED ORDER — PHENOL 1.4 % MT LIQD: 1.0000 | OROMUCOSAL | Status: DC | PRN

## 2017-10-13 MED ORDER — HYDROMORPHONE HCL 1 MG/ML IJ SOLN
1.0000 mg | INTRAMUSCULAR | Status: DC | PRN
  Administered 2017-10-13: 1 mg via INTRAVENOUS
  Filled 2017-10-13: qty 1

## 2017-10-13 MED ORDER — VANCOMYCIN HCL IN DEXTROSE 1-5 GM/200ML-% IV SOLN
1000.0000 mg | Freq: Once | INTRAVENOUS | Status: AC
  Administered 2017-10-14: 1000 mg via INTRAVENOUS
  Filled 2017-10-13: qty 200

## 2017-10-13 MED ORDER — HYDROMORPHONE HCL 1 MG/ML IJ SOLN
INTRAMUSCULAR | Status: AC
Start: 2017-10-13 — End: 2017-10-13
  Filled 2017-10-13: qty 0.5

## 2017-10-13 MED ORDER — SODIUM CHLORIDE 0.9 % IV SOLN: 250.0000 mL | INTRAVENOUS | Status: DC

## 2017-10-13 MED ORDER — METFORMIN HCL 500 MG PO TABS
500.0000 mg | ORAL_TABLET | Freq: Two times a day (BID) | ORAL | Status: DC
  Administered 2017-10-14: 500 mg via ORAL
  Filled 2017-10-13: qty 1

## 2017-10-13 MED ORDER — CEFAZOLIN SODIUM 1 G IJ SOLR
INTRAMUSCULAR | Status: AC
  Filled 2017-10-13: qty 20

## 2017-10-13 MED ORDER — ONDANSETRON HCL 4 MG PO TABS: 4.0000 mg | ORAL_TABLET | Freq: Four times a day (QID) | ORAL | Status: DC | PRN

## 2017-10-13 MED ORDER — METHOCARBAMOL 500 MG PO TABS
ORAL_TABLET | ORAL | Status: AC
  Filled 2017-10-13: qty 1

## 2017-10-13 MED ORDER — LACTATED RINGERS IV SOLN
INTRAVENOUS | Status: DC
  Administered 2017-10-13: 21:00:00 via INTRAVENOUS

## 2017-10-13 MED ORDER — THROMBIN (RECOMBINANT) 20000 UNITS EX SOLR
CUTANEOUS | Status: DC | PRN
  Administered 2017-10-13: 20000 [IU] via TOPICAL

## 2017-10-13 MED ORDER — MIDAZOLAM HCL 5 MG/5ML IJ SOLN
INTRAMUSCULAR | Status: DC | PRN
  Administered 2017-10-13: 2 mg via INTRAVENOUS

## 2017-10-13 MED ORDER — ROCURONIUM BROMIDE 10 MG/ML (PF) SYRINGE
PREFILLED_SYRINGE | INTRAVENOUS | Status: DC | PRN
  Administered 2017-10-13: 50 mg via INTRAVENOUS

## 2017-10-13 MED ORDER — HEMOSTATIC AGENTS (NO CHARGE) OPTIME
TOPICAL | Status: DC | PRN
  Administered 2017-10-13 (×2): 1 via TOPICAL

## 2017-10-13 MED ORDER — DOCUSATE SODIUM 100 MG PO CAPS
100.0000 mg | ORAL_CAPSULE | Freq: Two times a day (BID) | ORAL | Status: DC
  Administered 2017-10-13 – 2017-10-14 (×2): 100 mg via ORAL
  Filled 2017-10-13 (×2): qty 1

## 2017-10-13 MED ORDER — VANCOMYCIN HCL IN DEXTROSE 1-5 GM/200ML-% IV SOLN: 1000.0000 mg | INTRAVENOUS | Status: DC

## 2017-10-13 MED ORDER — IBUPROFEN 600 MG PO TABS
600.0000 mg | ORAL_TABLET | Freq: Once | ORAL | Status: AC
  Administered 2017-10-13: 600 mg via ORAL
  Filled 2017-10-13: qty 1

## 2017-10-13 MED ORDER — SODIUM CHLORIDE 0.9% FLUSH: 3.0000 mL | INTRAVENOUS | Status: DC | PRN

## 2017-10-13 MED ORDER — MAGNESIUM CITRATE PO SOLN: 1.0000 | Freq: Once | ORAL | Status: DC | PRN

## 2017-10-13 MED ORDER — CYCLOBENZAPRINE HCL 10 MG PO TABS
10.0000 mg | ORAL_TABLET | Freq: Four times a day (QID) | ORAL | Status: DC | PRN
  Administered 2017-10-13: 10 mg via ORAL

## 2017-10-13 MED ORDER — ACETAMINOPHEN 500 MG PO TABS
ORAL_TABLET | ORAL | Status: AC
  Administered 2017-10-13: 1000 mg via ORAL
  Filled 2017-10-13: qty 2

## 2017-10-13 MED ORDER — THROMBIN (RECOMBINANT) 20000 UNITS EX SOLR
CUTANEOUS | Status: AC
  Filled 2017-10-13: qty 20000

## 2017-10-13 MED ORDER — BUPIVACAINE-EPINEPHRINE (PF) 0.25% -1:200000 IJ SOLN
INTRAMUSCULAR | Status: AC
  Filled 2017-10-13: qty 30

## 2017-10-13 MED ORDER — DEXTROSE 50 % IV SOLN
INTRAVENOUS | Status: AC
  Filled 2017-10-13: qty 50

## 2017-10-13 MED ORDER — ALBUTEROL SULFATE (2.5 MG/3ML) 0.083% IN NEBU: 3.0000 mL | INHALATION_SOLUTION | RESPIRATORY_TRACT | Status: DC | PRN

## 2017-10-13 MED ORDER — HYDROMORPHONE HCL 1 MG/ML IJ SOLN
INTRAMUSCULAR | Status: DC | PRN
  Administered 2017-10-13: .25 mg via INTRAVENOUS
  Administered 2017-10-13: 0.5 mg via INTRAVENOUS
  Administered 2017-10-13: .25 mg via INTRAVENOUS

## 2017-10-13 MED ORDER — BUPIVACAINE-EPINEPHRINE 0.25% -1:200000 IJ SOLN
INTRAMUSCULAR | Status: DC | PRN
  Administered 2017-10-13: 10 mL

## 2017-10-13 MED ORDER — MENTHOL 3 MG MT LOZG: 1.0000 | LOZENGE | OROMUCOSAL | Status: DC | PRN

## 2017-10-13 MED ORDER — ACETAMINOPHEN 650 MG RE SUPP: 650.0000 mg | RECTAL | Status: DC | PRN

## 2017-10-13 SURGICAL SUPPLY — 57 items
CANISTER SUCT 3000ML PPV (MISCELLANEOUS) ×3 IMPLANT
CLOSURE STERI-STRIP 1/2X4 (GAUZE/BANDAGES/DRESSINGS) ×2
CLSR STERI-STRIP ANTIMIC 1/2X4 (GAUZE/BANDAGES/DRESSINGS) ×4 IMPLANT
COVER MAYO STAND STRL (DRAPES) ×6 IMPLANT
COVER SURGICAL LIGHT HANDLE (MISCELLANEOUS) ×3 IMPLANT
DRAPE C-ARM 42X72 X-RAY (DRAPES) ×3 IMPLANT
DRAPE INCISE IOBAN 85X60 (DRAPES) IMPLANT
DRAPE POUCH INSTRU U-SHP 10X18 (DRAPES) ×3 IMPLANT
DRAPE SURG 17X23 STRL (DRAPES) ×3 IMPLANT
DRAPE U-SHAPE 47X51 STRL (DRAPES) ×3 IMPLANT
DRSG OPSITE POSTOP 4X10 (GAUZE/BANDAGES/DRESSINGS) ×3 IMPLANT
DRSG OPSITE POSTOP 4X6 (GAUZE/BANDAGES/DRESSINGS) ×6 IMPLANT
DURAPREP 26ML APPLICATOR (WOUND CARE) ×3 IMPLANT
ELECT BLADE 4.0 EZ CLEAN MEGAD (MISCELLANEOUS) ×3
ELECT CAUTERY BLADE 6.4 (BLADE) ×3 IMPLANT
ELECT PENCIL ROCKER SW 15FT (MISCELLANEOUS) ×3 IMPLANT
ELECT REM PT RETURN 9FT ADLT (ELECTROSURGICAL) ×3
ELECTRODE BLDE 4.0 EZ CLN MEGD (MISCELLANEOUS) ×1 IMPLANT
ELECTRODE REM PT RTRN 9FT ADLT (ELECTROSURGICAL) ×1 IMPLANT
FLOSEAL 10ML (HEMOSTASIS) ×3 IMPLANT
GLOVE BIO SURGEON STRL SZ7 (GLOVE) ×3 IMPLANT
GLOVE BIOGEL PI IND STRL 7.0 (GLOVE) ×1 IMPLANT
GLOVE BIOGEL PI IND STRL 8.5 (GLOVE) ×1 IMPLANT
GLOVE BIOGEL PI INDICATOR 7.0 (GLOVE) ×2
GLOVE BIOGEL PI INDICATOR 8.5 (GLOVE) ×2
GLOVE SS N UNI LF 8.5 STRL (GLOVE) ×6 IMPLANT
GOWN STRL REUS W/ TWL LRG LVL3 (GOWN DISPOSABLE) ×2 IMPLANT
GOWN STRL REUS W/TWL 2XL LVL3 (GOWN DISPOSABLE) ×3 IMPLANT
GOWN STRL REUS W/TWL LRG LVL3 (GOWN DISPOSABLE) ×4
KIT BASIN OR (CUSTOM PROCEDURE TRAY) ×3 IMPLANT
KIT ROOM TURNOVER OR (KITS) ×3 IMPLANT
LAMI NARROW PRIPOLE 16CH (Orthopedic Implant) ×3 IMPLANT
NEEDLE 22X1 1/2 (OR ONLY) (NEEDLE) ×3 IMPLANT
NEEDLE SPNL 18GX3.5 QUINCKE PK (NEEDLE) ×3 IMPLANT
NS IRRIG 1000ML POUR BTL (IV SOLUTION) ×3 IMPLANT
PACK LAMINECTOMY ORTHO (CUSTOM PROCEDURE TRAY) ×3 IMPLANT
PACK UNIVERSAL I (CUSTOM PROCEDURE TRAY) ×3 IMPLANT
PAD ARMBOARD 7.5X6 YLW CONV (MISCELLANEOUS) ×6 IMPLANT
PROCLAIM ELITE 7IPG W/PAT CONT (Neuro Prosthesis/Implant) ×3 IMPLANT
PROCLAIM PATIENT CONTROLLER ×3 IMPLANT
SPATULA SILICONE BRAIN 10MM (MISCELLANEOUS) IMPLANT
SPONGE LAP 4X18 X RAY DECT (DISPOSABLE) IMPLANT
SPONGE SURGIFOAM ABS GEL 100 (HEMOSTASIS) ×3 IMPLANT
STAPLER VISISTAT 35W (STAPLE) ×3 IMPLANT
SURGIFLO W/THROMBIN 8M KIT (HEMOSTASIS) IMPLANT
SUT BONE WAX W31G (SUTURE) ×3 IMPLANT
SUT ETHIBOND 2 OS 4 DA (SUTURE) ×3 IMPLANT
SUT MNCRL AB 3-0 PS2 18 (SUTURE) ×3 IMPLANT
SUT VIC AB 1 CT1 18XCR BRD 8 (SUTURE) ×1 IMPLANT
SUT VIC AB 1 CT1 8-18 (SUTURE) ×2
SUT VIC AB 1 CTX 18 (SUTURE) ×3 IMPLANT
SUT VIC AB 2-0 CT1 18 (SUTURE) ×6 IMPLANT
SYR BULB IRRIGATION 50ML (SYRINGE) ×3 IMPLANT
SYR CONTROL 10ML LL (SYRINGE) ×3 IMPLANT
TOWEL OR 17X24 6PK STRL BLUE (TOWEL DISPOSABLE) ×3 IMPLANT
TOWEL OR 17X26 10 PK STRL BLUE (TOWEL DISPOSABLE) ×3 IMPLANT
WATER STERILE IRR 1000ML POUR (IV SOLUTION) IMPLANT

## 2017-10-13 NOTE — Anesthesia Preprocedure Evaluation (Addendum)
Anesthesia Evaluation  Patient identified by MRN, date of birth, ID band Patient awake    Reviewed: Allergy & Precautions, H&P , NPO status , Patient's Chart, lab work & pertinent test results  Airway Mallampati: II  TM Distance: >3 FB Neck ROM: Full    Dental no notable dental hx. (+) Teeth Intact, Dental Advisory Given   Pulmonary asthma ,    Pulmonary exam normal breath sounds clear to auscultation       Cardiovascular negative cardio ROS   Rhythm:Regular Rate:Normal     Neuro/Psych  Headaches, Anxiety negative psych ROS   GI/Hepatic negative GI ROS, Neg liver ROS, GERD  ,  Endo/Other  diabetes, Type 2, Oral Hypoglycemic AgentsMorbid obesity  Renal/GU negative Renal ROS  negative genitourinary   Musculoskeletal   Abdominal   Peds  Hematology negative hematology ROS (+)   Anesthesia Other Findings   Reproductive/Obstetrics negative OB ROS                            Anesthesia Physical Anesthesia Plan  ASA: III  Anesthesia Plan: General   Post-op Pain Management:    Induction: Intravenous  PONV Risk Score and Plan: 4 or greater and Ondansetron, Dexamethasone and Midazolam  Airway Management Planned: Oral ETT  Additional Equipment:   Intra-op Plan:   Post-operative Plan: Extubation in OR  Informed Consent: I have reviewed the patients History and Physical, chart, labs and discussed the procedure including the risks, benefits and alternatives for the proposed anesthesia with the patient or authorized representative who has indicated his/her understanding and acceptance.   Dental advisory given  Plan Discussed with: CRNA  Anesthesia Plan Comments:        Anesthesia Quick Evaluation

## 2017-10-13 NOTE — Anesthesia Postprocedure Evaluation (Signed)
Anesthesia Post Note  Patient: Natalie Yoder  Procedure(s) Performed: LUMBAR SPINAL CORD STIMULATOR INSERTION (N/A Spine Lumbar)     Patient location during evaluation: PACU Anesthesia Type: General Level of consciousness: awake and alert Pain management: pain level controlled Vital Signs Assessment: post-procedure vital signs reviewed and stable Respiratory status: spontaneous breathing, nonlabored ventilation, respiratory function stable and patient connected to nasal cannula oxygen Cardiovascular status: blood pressure returned to baseline and stable Postop Assessment: no apparent nausea or vomiting Anesthetic complications: no    Last Vitals:  Vitals:   10/13/17 1945 10/13/17 2000  BP:    Pulse: 95 93  Resp: 12 15  Temp:  (!) 36.1 C  SpO2: 97% 99%                   Shelton SilvasKevin D Jullian Previti

## 2017-10-13 NOTE — Progress Notes (Signed)
Pharmacy Antibiotic Note  Natalie Yoder is a 42 y.o. female admitted on 10/13/2017 for lumbar spinal core stimulator insertion to receive surgical prophylaxis.  Pharmacy has been consulted for Vancomycin dosing.  Pre-op vancomycin dose was given at 1628 PM. SCr on 1/8 was 0.7 with estimated CrCl ~110 ml/min.  Surgical incision was closed. No drains in place.   Plan: Vancomycin 1g IV x1 at 0430 AM (12 hours post pre-op dose).  Pharmacy will sign off.  Please reconsult if needed.   Height: 5\' 6"  (167.6 cm) Weight: 218 lb (98.9 kg) IBW/kg (Calculated) : 59.3  Temp (24hrs), Avg:97.4 F (36.3 C), Min:97 F (36.1 C), Max:97.8 F (36.6 C)  Recent Labs  Lab 10/11/17 0931  CREATININE 0.70    Estimated Creatinine Clearance: 109.7 mL/min (by C-G formula based on SCr of 0.7 mg/dL).    Allergies  Allergen Reactions  . Fentanyl Nausea And Vomiting, Hives and Itching    Makes her fall and can not keep anything on her stomach  . Hydrocodone Hives and Itching  . Methocarbamol Hives and Itching  . Penicillins Itching, Rash and Hives    Has patient had a PCN reaction causing immediate rash, facial/tongue/throat swelling, SOB or lightheadedness with hypotension: no Has patient had a PCN reaction causing severe rash involving mucus membranes or skin necrosis: No Has patient had a PCN reaction that required hospitalization No Has patient had a PCN reaction occurring within the last 10 years: Yes If all of the above answers are "NO", then may proceed with Cephalosporin use.   . Adhesive [Tape] Itching    whelts  . Oxycodone Hcl Itching and Nausea Only  . Exenatide Rash    BYETTA    Thank you for allowing pharmacy to be a part of this patient's care.  Link SnufferJessica Dell Briner, PharmD, BCPS, BCCCP Clinical Pharmacist Clinical phone 10/13/2017 until 11PM [phone]- [phone] After hours, please call #[phone] 10/13/2017 8:43 PM

## 2017-10-13 NOTE — Op Note (Signed)
Operative report.  Preoperative diagnosis.  Back syndrome.  Postoperative diagnosis same.  Operative procedure spinal cord stimulator implant.    implant system used: Proclaim 7 pulse battery from [employer].  Tripole paddle.  First Assistant: Anette Riedel, PA.  Complications: None.  Indications.  This is a very pleasant 42 year old woman has had multiple previous spine operations and has had chronic debilitating pain for some time now.  She had a spinal cord stimulator trial had excellent relief of her symptoms and so presented to me for definitive implantation.  All appropriate risks benefits and alternatives to surgery were discussed with the patient and consent was obtained.  Operative report.  Patient was brought the operating room placed upon the operating table.  After successful induction of general anesthesia and endotracheally patient teds SCDs were applied she was turned prone onto the Wilson frame.  All bony prominences were well-padded, and the back was prepped and draped in a standard fashion.  Timeout was taken to confirm patient procedure and all other important data.  C arm was used to identify the T12 vertebral body and then count up to the TL 10 vertebral body  I marked out an incision starting at the inferior aspect of the T10 pedicle and proceeding down to the superior aspect of the T12 pedicle.  Was then infiltrated with quarter percent Marcaine and then I made the incision.  Sharp dissection was carried out down to the deep fascia.  The deep fascia was sharply incised and using Cobb and Bovie I stripped the paraspinal muscles to expose the T10 and T11 spinous process and lamina.  Self-retaining retractor was placed and then another set of x-rays were taken from that was at the T10 level.  Once I confirmed the appropriate level with fluoroscopy and with the cord stimulator rep I used a double-action Leksell rongeur to remove the bulk of the T10 spinous process.  Then using 2 mm  Kerrison rongeurs I performed a generous laminotomy T10.  I then dissected through the central raphae and then resected the ligamentum flavum to expose the epidural dorsal fat.  I dissected gently through this with the [facility] for until I saw the dorsal aspect of the thecal sac.  I then used my dural spatula to create a path for the implant.  The spatula itself went without any significant problem.  I then obtained the implant (tripole paddle) and gently inserted.  The paddle was advanced to the superior portion of the T9 vertebral body.  The paddle itself was noted to be in the midline and slightly to the right at the request of the spinal cord stimulator rep.  Once I confirmed that the paddle lead spanned the appropriate area consistent with what was being used during the trial I then secured the leads directly to the spinous process of T11 with Ethibond sutures.  I then wrapped it around the T11 vertebral body to ensure that it was secured.  A second incision was made in the left gluteal region.  I then created a pocket approximately 3 cm deep.  I then used a submuscular passer connected the thoracic wound to the gluteal wound.  I then passed the leads to the battery site.  They were connected to the battery and then locked in place according manufacturer standards.  The battery was then secured to the deep fascia to #1 Vicryl sutures.  The excess lead was coiled and placed behind the battery.  At this point the battery was tested by the  spinal cord stimulator representative and was functioning without any problems.  At this point all wounds were copiously irrigated with normal saline and hemostasis was obtained.  I then closed each wound in a layered fashion with interrupted #1 Vicryl suture, 2-0 Vicryl suture, and 3-0 Monocryl for the skin.  Steri-Strips dry dressings were applied and the patient was ultimately extubated transferred the PACU without incident.  At the end of the case all needle sponge counts  were correct no adverse intraoperative events.

## 2017-10-13 NOTE — Transfer of Care (Signed)
Immediate Anesthesia Transfer of Care Note  Patient: Natalie Yoder  Procedure(s) Performed: LUMBAR SPINAL CORD STIMULATOR INSERTION (N/A Spine Lumbar)  Patient Location: PACU  Anesthesia Type:General  Level of Consciousness: awake, alert  and oriented  Airway & Oxygen Therapy: Patient Spontanous Breathing and Patient connected to nasal cannula oxygen  Post-op Assessment: Report given to RN and Post -op Vital signs reviewed and stable  Post vital signs: Reviewed and stable  Last Vitals:  Vitals:   10/13/17 1108 10/13/17 1847  BP: (!) 154/62   Pulse: 95   Resp: 20   Temp: 36.6 C (!) 36.3 C  SpO2: 100%     Last Pain:  Vitals:   10/13/17 1130  TempSrc:   PainSc: 10-Worst pain ever      Patients Stated Pain Goal: 3 (10/13/17 1130)  Complications: No apparent anesthesia complications

## 2017-10-13 NOTE — Anesthesia Procedure Notes (Signed)
Procedure Name: Intubation Date/Time: 10/13/2017 4:24 PM Performed by: Wilburn Cornelia, CRNA Pre-anesthesia Checklist: Patient identified, Emergency Drugs available, Suction available, Patient being monitored and Timeout performed Patient Re-evaluated:Patient Re-evaluated prior to induction Oxygen Delivery Method: Circle system utilized Preoxygenation: Pre-oxygenation with 100% oxygen Induction Type: IV induction Ventilation: Mask ventilation without difficulty Laryngoscope Size: Mac and 3 Grade View: Grade III Tube type: Oral Tube size: 7.5 mm Number of attempts: 1 Airway Equipment and Method: Stylet Placement Confirmation: ETT inserted through vocal cords under direct vision,  breath sounds checked- equal and bilateral,  CO2 detector and positive ETCO2 Secured at: 22 cm Tube secured with: Tape Dental Injury: Teeth and Oropharynx as per pre-operative assessment

## 2017-10-13 NOTE — Brief Op Note (Signed)
10/13/2017  6:31 PM  PATIENT:  Natalie Yoder  42 y.o. female  PRE-OPERATIVE DIAGNOSIS:  Chronic pain syndrome  POST-OPERATIVE DIAGNOSIS:  Chronic pain syndrome  PROCEDURE:  Procedure(s) with comments: LUMBAR SPINAL CORD STIMULATOR INSERTION (N/A) - 120 mins  SURGEON:  Surgeon(s) and Role:    Venita Lick* Evan Osburn, MD - Primary  PHYSICIAN ASSISTANT:   ASSISTANTS: Carmen [name] PA   ANESTHESIA:   general  EBL:  50 mL   BLOOD ADMINISTERED:none  DRAINS: none   LOCAL MEDICATIONS USED:  MARCAINE     SPECIMEN:  No Specimen  DISPOSITION OF SPECIMEN:  N/A  COUNTS:  YES  TOURNIQUET:  * No tourniquets in log *  DICTATION: .Dragon Dictation  PLAN OF CARE: Admit for overnight observation  PATIENT DISPOSITION:  PACU - hemodynamically stable.

## 2017-10-14 DIAGNOSIS — J45909 Unspecified asthma, uncomplicated: Secondary | ICD-10-CM | POA: Diagnosis not present

## 2017-10-14 DIAGNOSIS — Z981 Arthrodesis status: Secondary | ICD-10-CM | POA: Diagnosis not present

## 2017-10-14 DIAGNOSIS — M797 Fibromyalgia: Secondary | ICD-10-CM | POA: Diagnosis not present

## 2017-10-14 DIAGNOSIS — Z79899 Other long term (current) drug therapy: Secondary | ICD-10-CM | POA: Diagnosis not present

## 2017-10-14 DIAGNOSIS — G894 Chronic pain syndrome: Secondary | ICD-10-CM | POA: Diagnosis not present

## 2017-10-14 DIAGNOSIS — Z7984 Long term (current) use of oral hypoglycemic drugs: Secondary | ICD-10-CM | POA: Diagnosis not present

## 2017-10-14 DIAGNOSIS — Z79891 Long term (current) use of opiate analgesic: Secondary | ICD-10-CM | POA: Diagnosis not present

## 2017-10-14 DIAGNOSIS — E119 Type 2 diabetes mellitus without complications: Secondary | ICD-10-CM | POA: Diagnosis not present

## 2017-10-14 MED ORDER — CANAGLIFLOZIN 100 MG PO TABS
100.0000 mg | ORAL_TABLET | Freq: Every day | ORAL | Status: DC
  Filled 2017-10-14: qty 1

## 2017-10-14 NOTE — Plan of Care (Signed)
Patient has progressed well.  Sights look good.  Vitals have remained stable through night.

## 2017-10-14 NOTE — Social Work (Signed)
CSW acknowledging consult for SNF placement.   CSW noting that OT note states "pt planning to d/c home with 24/7 supervision from family."  CSW signing off. Please consult if any additional needs arise.  Doy HutchingIsabel H Aleighna Wojtas, LCSWA [facility] Clinical Social Work [phone]

## 2017-10-14 NOTE — Discharge Instructions (Signed)
Spinal Cord Stimulator Placement Care After Refer to this sheet in the next few weeks. These instructions provide you with information about caring for yourself after your procedure. Your health care provider may also give you more specific instructions. Your treatment has been planned according to current medical practices, but problems sometimes occur. Call your health care provider if you have any problems or questions after your procedure. What can I expect after the procedure? After the procedure, it is common to have:  Pain.  Numbness.  Weakness.   Ok to shower in 5 days - Allow water to flow over dressing, do not point shower directly at incision site.  Follow these instructions at home: Medicines  Take medicines only as directed by your health care provider.  If you were prescribed an antibiotic medicine, finish all of it even if you start to feel better. Incision care  There are many different ways to close and cover an incision, including stitches (sutures), skin glue, and adhesive strips. Follow your health care provider's instructions about: ? Incision care. ? Bandage (dressing) changes and removal. ? Incision closure removal.  Check your incision area every day for signs of infection. If you cannot see your incision, have someone check it for you. Watch for: ? Redness, swelling, or pain. ? Fluid, blood, or pus. Activity  Avoid sitting for longer than 20 minutes at a time or as directed by your health care provider.  Do not climb stairs more than once each day until your health care provider approves.  Do not bend at your waist. To pick things up, bend your knees.  Do not lift anything that is heavier than 10 lb (4.5 kg) or as directed by your health care provider.  Do not drive a car until your health care provider approves.  Ask your health care provider when you may return to your normal activities, such as playing sports and going back to work.  Work with  your physical therapist to learn safe movement and exercises to help healing. Do these exercises as directed.  Take short walks often. General instructions  Do not use any tobacco products, including cigarettes, chewing tobacco, or electronic cigarettes. If you need help quitting, ask your health care provider.  Follow your health care providers instructions about bathing. Do not take baths, shower, swim, or use a hot tub until your health care provider approves.  Wear your back brace as directed by your health care provider.  To prevent constipation: ? Drink enough fluid to keep your urine clear or pale yellow. ? Eat plenty of fruits, vegetables, and whole grains.  Keep all follow-up visits as directed by your health care provider. This is important. This includes any follow-up visits with your physical therapist. Contact a health care provider if:  You have a fever.  You have redness, swelling, or pain in your incision area.  Your pain is not controlled with medicine.  You have pain, numbness, or weakness that lasts longer than three weeks after surgery.  You become constipated. Get help right away if:  You have fluid, blood, or pus coming from your incision.  You have increasing pain, numbness, or weakness.  You lose control of when you urinate or have a bowel movement (incontinence).  You have chest pain.  You have trouble breathing. This information is not intended to replace advice given to you by your health care provider. Make sure you discuss any questions you have with your health care provider. Document Released: 08/25/2004  Document Revised: 02/26/2016 Document Reviewed: 05/15/2014 Elsevier Interactive Patient Education  [employer].

## 2017-10-14 NOTE — Evaluation (Signed)
Occupational Therapy Evaluation and Discharge Patient Details Name: Natalie Yoder MRN: [id number] DOB: Dec 13, 1975 Today's Date: 10/14/2017    History of Present Illness Pt is a 42 y.o. female s/p spinal cord stimulator insertion. PMHx: DM2, Fibromyalgia, Hx of IBS, Migraines, Spine sx 2008, 2011, 2012.    Clinical Impression   Pt reports she was independent with ADL PTA. Currently pt supervision with ADL and functional mobility. All back, safety, and ADL education completed with pt for comfort and back health. Pt planning to d/c home with 24/7 supervision from family. No further acute OT needs identified; signing off at this time. Please re-consult if needs change. Thank you for this referral.    Follow Up Recommendations  No OT follow up;Supervision - Intermittent    Equipment Recommendations  None recommended by OT    Recommendations for Other Services       Precautions / Restrictions Precautions Precaution Comments: Educated pt on back precautions for comfort and back health. Required Braces or Orthoses: (No brace order) Restrictions Weight Bearing Restrictions: No      Mobility Bed Mobility               General bed mobility comments: Pt OOB in chair upon arrival  Transfers Overall transfer level: Needs assistance Equipment used: None Transfers: Sit to/from Stand Sit to Stand: Supervision         General transfer comment: Supervision for safety. Increased time and effort    Balance Overall balance assessment: Needs assistance Sitting-balance support: Feet supported;No upper extremity supported Sitting balance-Leahy Scale: Normal     Standing balance support: No upper extremity supported;During functional activity Standing balance-Leahy Scale: Good                             ADL either performed or assessed with clinical judgement   ADL Overall ADL's : Needs assistance/impaired Eating/Feeding: Independent;Sitting   Grooming:  Supervision/safety;Standing;Wash/dry hands Grooming Details (indicate cue type and reason): Educated on use of 2 cups for oral care Upper Body Bathing: Supervision/ safety;Standing   Lower Body Bathing: Supervison/ safety   Upper Body Dressing : Supervision/safety;Standing Upper Body Dressing Details (indicate cue type and reason): to don shirt Lower Body Dressing: Supervision/safety;Sit to/from stand Lower Body Dressing Details (indicate cue type and reason): Educated on compensatory strategies for LB ADL; pt able to don underwear and pants without assist Toilet Transfer: Supervision/safety;Ambulation;Comfort height toilet   Toileting- Clothing Manipulation and Hygiene: Supervision/safety;Sit to/from stand       Functional mobility during ADLs: Supervision/safety General ADL Comments: Educated pt on back precautions during functional activities, frequent mobility, not picking up young daughter     Risk analyst     Praxis      Pertinent Vitals/Pain Pain Assessment: Faces Faces Pain Scale: Hurts even more Pain Location: incision Pain Descriptors / Indicators: Grimacing;Guarding Pain Intervention(s): Monitored during session     Hand Dominance     Extremity/Trunk Assessment Upper Extremity Assessment Upper Extremity Assessment: Overall WFL for tasks assessed   Lower Extremity Assessment Lower Extremity Assessment: Defer to PT evaluation   Cervical / Trunk Assessment Cervical / Trunk Assessment: Other exceptions Cervical / Trunk Exceptions: Hx of multiple back sxs   Communication Communication Communication: No difficulties   Cognition Arousal/Alertness: Awake/alert Behavior During Therapy: WFL for tasks assessed/performed Overall Cognitive Status: Within Functional Limits for tasks assessed  General Comments       Exercises     Shoulder Instructions      Home Living Family/patient expects to  be discharged to:: Private residence Living Arrangements: Parent;Children Available Help at Discharge: Family;Available 24 hours/day Type of Home: House Home Access: Stairs to enter [employer] CorporationEntrance Stairs-Number of Steps: 1+1   Home Layout: One level     Bathroom Shower/Tub: Chief Strategy OfficerTub/shower unit   Bathroom Toilet: Standard     Home Equipment: None          Prior Functioning/Environment Level of Independence: Independent                 OT Problem List:        OT Treatment/Interventions:      OT Goals(Current goals can be found in the care plan section) Acute Rehab OT Goals Patient Stated Goal: no more surgery OT Goal Formulation: All assessment and education complete, DC therapy  OT Frequency:     Barriers to D/C:            Co-evaluation              AM-PAC PT "6 Clicks" Daily Activity     Outcome Measure Help from another person eating meals?: None Help from another person taking care of personal grooming?: A Little Help from another person toileting, which includes using toliet, bedpan, or urinal?: A Little Help from another person bathing (including washing, rinsing, drying)?: A Little Help from another person to put on and taking off regular upper body clothing?: A Little Help from another person to put on and taking off regular lower body clothing?: A Little 6 Click Score: 19   End of Session Nurse Communication: Mobility status  Activity Tolerance: Patient tolerated treatment well Patient left: in chair;with call bell/phone within reach;with nursing/sitter in room  OT Visit Diagnosis: Unsteadiness on feet (R26.81);Pain Pain - part of body: (back)                Time: [id number] OT Time Calculation (min): 13 min Charges:  OT General Charges $OT Visit: 1 Visit OT Evaluation $OT Eval Low Complexity: 1 Low G-Codes:     Toney Lizaola A. Brett Albinooffey, M.S., OTR/L Pager: [phone]  Gaye AlkenBailey A Heiley Shaikh 10/14/2017, 10:40 AM

## 2017-10-14 NOTE — Progress Notes (Signed)
    Subjective: Procedure(s) (LRB): LUMBAR SPINAL CORD STIMULATOR INSERTION (N/A) 1 Day Post-Op  Patient reports pain as 2 on 0-10 scale.  Reports decreased leg pain reports incisional back pain   Positive void Negative bowel movement Positive flatus Negative chest pain or shortness of breath  Objective: Vital signs in last 24 hours: Temp:  [97 F (36.1 C)-98.4 F (36.9 C)] 97.5 F (36.4 C) (01/11 0432) Pulse Rate:  [84-100] 100 (01/11 0432) Resp:  [10-20] 13 (01/11 0432) BP: (110-154)/(61-85) 116/67 (01/11 0432) SpO2:  [91 %-100 %] 99 % (01/11 0432) Weight:  [98.9 kg (218 lb)] 98.9 kg (218 lb) (01/10 1130)  Intake/Output from previous day: 01/10 0701 - 01/11 0700 In: 3197.5 [P.O.:660; I.V.:2337.5; IV Piggyback:200] Out: 650 [Urine:600; Blood:50]  Labs: No results for input(s): WBC, RBC, HCT, PLT in the last 72 hours. Recent Labs    10/11/17 0931  NA 137  K 3.9  CL 105  CO2 20*  BUN 11  CREATININE 0.70  GLUCOSE 112*  CALCIUM 9.7   No results for input(s): LABPT, INR in the last 72 hours.  Physical Exam: Neurologically intact Intact pulses distally Incision: dressing C/D/I Compartment soft Body mass index is 35.19 kg/m.   Assessment/Plan: Patient stable  xrays n/a Continue mobilization with physical therapy Continue care  Advance diet Up with therapy  Patient is doing quite well status post spinal cord stimulator placement. We will mobilize the patient today and if doing well can be discharged late morning.  We will make sure that the spinal cord stimulator rep sees the patient prior to her discharge.  [name] Trevonn Hallum, MD [facility] [phone]

## 2017-10-17 ENCOUNTER — Encounter (HOSPITAL_COMMUNITY): Payer: Self-pay | Admitting: Orthopedic Surgery

## 2017-10-19 ENCOUNTER — Other Ambulatory Visit: Payer: Self-pay | Admitting: *Deleted

## 2017-10-19 MED ORDER — JARDIANCE 25 MG PO TABS: 25.0000 mg | ORAL_TABLET | Freq: Every day | ORAL | 0 refills | Status: DC

## 2017-10-19 NOTE — Addendum Note (Signed)
Addended by: Julious PayerHOLT, Donis Pinder D on: 10/19/2017 09:43 AM   Modules accepted: Orders

## 2017-10-19 NOTE — Discharge Summary (Signed)
Physician Discharge Summary  Patient ID: Natalie Yoder MRN: [id number] DOB/AGE: 1976-05-14 42 y.o.  Admit date: 10/13/2017 Discharge date: 10/19/2017  Admission Diagnoses:  Chronic pain Syndrome  Discharge Diagnoses:  Active Problems:   S/P insertion of spinal cord stimulator   Past Medical History:  Diagnosis Date  . Anxiety   . Arthritis    back  . Asthma   . Chronic pain syndrome   . Constipation due to pain medication   . Diabetes mellitus without complication (HCC)    Type 2  . Fibromyalgia   . GERD (gastroesophageal reflux disease)   . History of IBS   . Migraines   . Thyroid nodule     Surgeries: Procedure(s): LUMBAR SPINAL CORD STIMULATOR INSERTION on 10/13/2017   Consultants (if any):   Discharged Condition: Improved  Hospital Course: Natalie Yoder is an 42 y.o. female who was admitted 10/13/2017 with a diagnosis of Chronic Pain syndrome and went to the operating room on 10/13/2017 and underwent the above named procedures.  Post op day one pt reports low level of pain controlled on oral meds.  Pt is voiding w/o difficulty.  Denies nausea.  Pt is ambulating in hallway. Pt is cleared by PT before DC.  Pt is seen by [employer] rep before DC.   She was given perioperative antibiotics:  Anti-infectives (From admission, onward)   Start     Dose/Rate Route Frequency Ordered Stop   10/14/17 0430  vancomycin (VANCOCIN) IVPB 1000 mg/200 mL premix     1,000 mg 200 mL/hr over 60 Minutes Intravenous  Once 10/13/17 2049 10/14/17 0546   10/13/17 2032  vancomycin (VANCOCIN) IVPB 1000 mg/200 mL premix  Status:  Discontinued     1,000 mg 200 mL/hr over 60 Minutes Intravenous 60 min pre-op 10/13/17 2032 10/13/17 2040   10/13/17 1114  vancomycin (VANCOCIN) IVPB 1000 mg/200 mL premix     1,000 mg 200 mL/hr over 60 Minutes Intravenous 60 min pre-op 10/13/17 1114 10/13/17 1728    .  She was given sequential compression devices, early ambulation, and TED for DVT prophylaxis.  She  benefited maximally from the hospital stay and there were no complications.    Recent vital signs:  Vitals:   10/14/17 0100 10/14/17 0432  BP: 112/66 116/67  Pulse: 84 100  Resp: 14 13  Temp: 97.8 F (36.6 C) (!) 97.5 F (36.4 C)  SpO2: 96% 99%    Recent laboratory studies:  Lab Results  Component Value Date   HGB 11.7 (L) 10/29/2015   HGB 12.8 10/28/2015   HGB 15.9 (H) 09/08/2015   Lab Results  Component Value Date   WBC 11.8 (H) 10/29/2015   PLT 314 10/29/2015   Lab Results  Component Value Date   INR 0.96 02/12/2011   Lab Results  Component Value Date   NA 137 10/11/2017   K 3.9 10/11/2017   CL 105 10/11/2017   CO2 20 (L) 10/11/2017   BUN 11 10/11/2017   CREATININE 0.70 10/11/2017   GLUCOSE 112 (H) 10/11/2017    Discharge Medications:   Allergies as of 10/14/2017      Reactions   Fentanyl Nausea And Vomiting, Hives, Itching   Makes her fall and can not keep anything on her stomach   Hydrocodone Hives, Itching   Methocarbamol Hives, Itching   Penicillins Itching, Rash, Hives   Has patient had a PCN reaction causing immediate rash, facial/tongue/throat swelling, SOB or lightheadedness with hypotension: no Has patient had a PCN reaction causing  severe rash involving mucus membranes or skin necrosis: No Has patient had a PCN reaction that required hospitalization No Has patient had a PCN reaction occurring within the last 10 years: Yes If all of the above answers are "NO", then may proceed with Cephalosporin use.   Adhesive [tape] Itching   whelts   Oxycodone Hcl Itching, Nausea Only   Exenatide Rash   BYETTA      Medication List    STOP taking these medications   aspirin-acetaminophen-caffeine 250-250-65 MG tablet Commonly known as:  EXCEDRIN MIGRAINE   ibuprofen 800 MG tablet Commonly known as:  ADVIL,MOTRIN   traMADol 50 MG tablet Commonly known as:  ULTRAM     TAKE these medications   albuterol 108 (90 Base) MCG/ACT inhaler Commonly known  as:  PROVENTIL HFA;VENTOLIN HFA Inhale 2 puffs into the lungs every 4 (four) hours as needed for wheezing or shortness of breath (cough, shortness of breath or wheezing.).   baclofen 10 MG tablet Commonly known as:  LIORESAL Take 10 mg by mouth 3 (three) times daily as needed for muscle spasms.   JARDIANCE 25 MG Tabs tablet Generic drug:  empagliflozin Take 25 mg by mouth at bedtime.   empagliflozin 10 MG Tabs tablet Commonly known as:  JARDIANCE Take 10 mg by mouth daily.   glucose blood test strip Commonly known as:  ONE TOUCH ULTRA TEST Use as instructed   HYDROmorphone 2 MG tablet Commonly known as:  DILAUDID Take 1 tablet (2 mg total) by mouth every 4 (four) hours as needed for severe pain.   metFORMIN 500 MG tablet Commonly known as:  GLUCOPHAGE Take 1 tablet (500 mg total) by mouth 2 (two) times daily with a meal.   multivitamin with minerals Tabs tablet Take 1 tablet by mouth at bedtime.   ondansetron 4 MG disintegrating tablet Commonly known as:  ZOFRAN ODT Take 1 tablet (4 mg total) by mouth every 8 (eight) hours as needed for nausea or vomiting.   onetouch ultrasoft lancets Use as instructed   TURMERIC COMPLEX/BLACK PEPPER PO Take 2 capsules by mouth at bedtime. Turmeric curcumin/Ginger/Black Pepper       Diagnostic Studies: Dg Thoracolumabar Spine  Result Date: 10/13/2017 CLINICAL DATA:  INTRAOPERATIVE FLUOROSCOPY: LUMBAR SPINAL CORD STIMULATOR INSERTION EXAM: THORACOLUMBAR SPINE 1V COMPARISON:  None. FINDINGS: Intraoperative fluoroscopic spot images of the thoracic spine are provided. Spinal cord stimulator appears appropriately positioned within the midline. Fluoroscopy was provided for 37 seconds. IMPRESSION: Spinal cord stimulator apparatus appears appropriately positioned within the midline. Electronically Signed   By: Bary Richard M.D.   On: 10/13/2017 20:12   Dg C-arm 1-60 Min  Result Date: 10/13/2017 CLINICAL DATA:  INTRAOPERATIVE FLUOROSCOPY:  LUMBAR SPINAL CORD STIMULATOR INSERTION EXAM: THORACOLUMBAR SPINE 1V COMPARISON:  None. FINDINGS: Intraoperative fluoroscopic spot images of the thoracic spine are provided. Spinal cord stimulator appears appropriately positioned within the midline. Fluoroscopy was provided for 37 seconds. IMPRESSION: Spinal cord stimulator apparatus appears appropriately positioned within the midline. Electronically Signed   By: Bary Richard M.D.   On: 10/13/2017 20:12   Dg C-arm 1-60 Min  Result Date: 10/13/2017 CLINICAL DATA:  INTRAOPERATIVE FLUOROSCOPY: LUMBAR SPINAL CORD STIMULATOR INSERTION EXAM: THORACOLUMBAR SPINE 1V COMPARISON:  None. FINDINGS: Intraoperative fluoroscopic spot images of the thoracic spine are provided. Spinal cord stimulator appears appropriately positioned within the midline. Fluoroscopy was provided for 37 seconds. IMPRESSION: Spinal cord stimulator apparatus appears appropriately positioned within the midline. Electronically Signed   By: Anne Ng.D.  On: 10/13/2017 20:12    Disposition: 01-Home or Self Care Pt will present to clinic in 2 weeks Post op meds provided  Discharge Instructions    Incentive spirometry RT   Complete by:  As directed       Follow-up Information    Venita LickBrooks, Dahari, MD Follow up in 2 week(s).   Specialty:  Orthopedic Surgery Contact information: [address] [place] [id number] [phone]            Signed: Kirt BoysMayo, Taisley Mordan Christina 10/19/2017, 8:15 AM

## 2017-11-08 ENCOUNTER — Encounter: Payer: Self-pay | Admitting: Endocrinology

## 2017-11-08 ENCOUNTER — Ambulatory Visit (INDEPENDENT_AMBULATORY_CARE_PROVIDER_SITE_OTHER): Payer: Medicare Other | Admitting: Endocrinology

## 2017-11-08 VITALS — BP 131/93 | HR 85 | Ht 66.0 in | Wt 215.0 lb

## 2017-11-08 DIAGNOSIS — E041 Nontoxic single thyroid nodule: Secondary | ICD-10-CM

## 2017-11-08 NOTE — Progress Notes (Signed)
Patient ID: Natalie Yoder, female   DOB: December 04, 1975, 42 y.o.   MRN: [id number]             Reason for Appointment: Evaluation of thyroid nodule    History of Present Illness:   The patient is being referred by her neurosurgeon Dr. Shon Baton for evaluation of a thyroid nodule  The patient's left thyroid nodule was first discovered in 2011 on a CT scan when it was 12 mm she has had multiple radiological studies on her neck because of cervical disc disease CT scan done in 09/2016 showed the following  A dominant 15 mm nodule within the posterior left lobe of the thyroid is not significantly changed. Other smaller nodules are present. More recently an MRI of the neck showed a 16 mm nodule neurosurgeon referred her for further evaluation  She has not had a history of goiter or thyroid nodule clinically  The thyroid ultrasound has not been done Thyroid levels have been normal  Lab Results  Component Value Date   FREET4 0.77 09/08/2015   TSH 0.642 09/15/2017   TSH 1.053 09/08/2015    Allergies as of 11/08/2017      Reactions   Fentanyl Nausea And Vomiting, Hives, Itching   Makes her fall and can not keep anything on her stomach   Hydrocodone Hives, Itching   Methocarbamol Hives, Itching   Penicillins Itching, Rash, Hives   Has patient had a PCN reaction causing immediate rash, facial/tongue/throat swelling, SOB or lightheadedness with hypotension: no Has patient had a PCN reaction causing severe rash involving mucus membranes or skin necrosis: No Has patient had a PCN reaction that required hospitalization No Has patient had a PCN reaction occurring within the last 10 years: Yes If all of the above answers are "NO", then may proceed with Cephalosporin use.   Adhesive [tape] Itching   whelts   Oxycodone Hcl Itching, Nausea Only   Exenatide Rash   BYETTA      Medication List        Accurate as of 11/08/17  9:18 AM. Always use your most recent med list.          albuterol 108  (90 Base) MCG/ACT inhaler Commonly known as:  PROVENTIL HFA;VENTOLIN HFA Inhale 2 puffs into the lungs every 4 (four) hours as needed for wheezing or shortness of breath (cough, shortness of breath or wheezing.).   baclofen 10 MG tablet Commonly known as:  LIORESAL Take 10 mg by mouth 3 (three) times daily as needed for muscle spasms.   glucose blood test strip Commonly known as:  ONE TOUCH ULTRA TEST Use as instructed   HYDROmorphone 2 MG tablet Commonly known as:  DILAUDID Take 1 tablet (2 mg total) by mouth every 4 (four) hours as needed for severe pain.   ibuprofen 800 MG tablet Commonly known as:  ADVIL,MOTRIN Take 1 tablet by mouth every 6 (six) hours as needed.   JARDIANCE 25 MG Tabs tablet Generic drug:  empagliflozin Take 25 mg by mouth at bedtime.   metFORMIN 500 MG tablet Commonly known as:  GLUCOPHAGE Take 1 tablet (500 mg total) by mouth 2 (two) times daily with a meal.   multivitamin with minerals Tabs tablet Take 1 tablet by mouth at bedtime.   ondansetron 4 MG disintegrating tablet Commonly known as:  ZOFRAN ODT Take 1 tablet (4 mg total) by mouth every 8 (eight) hours as needed for nausea or vomiting.   onetouch ultrasoft lancets Use as instructed  traMADol 50 MG tablet Commonly known as:  ULTRAM Take 1 tablet by mouth every 8 (eight) hours as needed.   TURMERIC COMPLEX/BLACK PEPPER PO Take 2 capsules by mouth at bedtime. Turmeric curcumin/Ginger/Black Pepper       Allergies:  Allergies  Allergen Reactions  . Fentanyl Nausea And Vomiting, Hives and Itching    Makes her fall and can not keep anything on her stomach  . Hydrocodone Hives and Itching  . Methocarbamol Hives and Itching  . Penicillins Itching, Rash and Hives    Has patient had a PCN reaction causing immediate rash, facial/tongue/throat swelling, SOB or lightheadedness with hypotension: no Has patient had a PCN reaction causing severe rash involving mucus membranes or skin necrosis:  No Has patient had a PCN reaction that required hospitalization No Has patient had a PCN reaction occurring within the last 10 years: Yes If all of the above answers are "NO", then may proceed with Cephalosporin use.   . Adhesive [Tape] Itching    whelts  . Oxycodone Hcl Itching and Nausea Only  . Exenatide Rash    BYETTA    Past Medical History:  Diagnosis Date  . Anxiety   . Arthritis    back  . Asthma   . Chronic pain syndrome   . Constipation due to pain medication   . Diabetes mellitus without complication (HCC)    Type 2  . Fibromyalgia   . GERD (gastroesophageal reflux disease)   . History of IBS   . Migraines   . Thyroid nodule     There is no history of radiation to the neck in childhood  Past Surgical History:  Procedure Laterality Date  . ANTERIOR FUSION CERVICAL SPINE  2011  . CERVICAL SPINE SURGERY  2012  . SHOULDER ARTHROSCOPY W/ LABRAL REPAIR Left 08/2017  . SPINAL CORD STIMULATOR INSERTION N/A 10/13/2017   Procedure: LUMBAR SPINAL CORD STIMULATOR INSERTION;  Surgeon: Venita LickBrooks, Dahari, MD;  Location: [facility] OR;  Service: Orthopedics;  Laterality: N/A;  120 mins  . SPINE SURGERY     Lumbar surgery - 2008    Family History  Problem Relation Age of Onset  . Diabetes Mother   . Hypertension Mother   . Diabetes Father   . Hypertension Father   . Hyperlipidemia Brother   . Hypertension Brother     Social History:  reports that  has never smoked. she has never used smokeless tobacco. She reports that she does not drink alcohol or use drugs.      Review of Systems  Constitutional: Negative for weight loss.  HENT: Negative for trouble swallowing.   Respiratory: Negative for shortness of breath.   Cardiovascular: Negative for leg swelling.  Endocrine: Negative for fatigue.  Musculoskeletal:       She has had recurrent neck pain, currently has a spinal stimulator implanted  Neurological: Negative for numbness and tingling.      Examination:   BP (!)  131/93 (BP Location: Left Arm, Patient Position: Sitting, Cuff Size: Large)   Pulse 85   Ht 5\' 6"  (1.676 m)   Wt 215 lb (97.5 kg)   SpO2 96%   BMI 34.70 kg/m    General Appearance:  well-looking         Eyes: No abnormal prominence or swelling of the eyes         THYROID: There is no enlargement of the left thyroid The right thyroid lobe is palpable and is about 1.5 times normal and slightly firm  and fleshy .  There is no lymphadenopathy in the neck  Heart sounds normal Lungs clear Abdomen exam not indicated .    Reflexes at biceps are normal.  Skin: No rash or lesions Extremities: No edema  Assessment/Plan:  Thyroid nodule: She has a dominant 1.6 cm nodule on the left lobe This is not palpable clinically Review of her radiology studies over the last several years shows that this was first seen in 2011 when it was 12 mm Also over the last year or so this had not changed significantly since her previous CT scan showed the nodule to be 1.5 cm She also has smaller multiple nodules so she likely has a multinodular goiter  Discussed with the patient that with the history of her nodule and the small size this is very likely a benign nodule Need to do an ultrasound to characterize this further Thyroid levels in the normal range recently   DIABETES: This has been followed up by her PCP with last A1c of 9.9 Discussed that she does need to have better control and may benefit from maximizing her Metformin  Consultation note sent to the referring physician  Reather Littler 11/08/2017

## 2017-11-14 ENCOUNTER — Ambulatory Visit
Admission: RE | Admit: 2017-11-14 | Discharge: 2017-11-14 | Disposition: A | Discharge status: Home / Self Care | Payer: Medicare Other | Source: Ambulatory Visit | Attending: Endocrinology | Admitting: Endocrinology

## 2017-11-14 DIAGNOSIS — E041 Nontoxic single thyroid nodule: Secondary | ICD-10-CM | POA: Diagnosis not present

## 2017-11-14 NOTE — Progress Notes (Signed)
Please call to let patient know that she has only one thyroid nodule as seen before and this is benign looking.  Given her previous results No biopsy is indicated Will see her next year

## 2017-12-14 ENCOUNTER — Encounter: Payer: Self-pay | Admitting: Pediatrics

## 2017-12-14 ENCOUNTER — Ambulatory Visit (INDEPENDENT_AMBULATORY_CARE_PROVIDER_SITE_OTHER): Payer: Medicare Other | Admitting: Pediatrics

## 2017-12-14 VITALS — BP 139/89 | HR 91 | Temp 97.7°F | Ht 66.0 in | Wt 215.2 lb

## 2017-12-14 DIAGNOSIS — K5900 Constipation, unspecified: Secondary | ICD-10-CM

## 2017-12-14 DIAGNOSIS — I10 Essential (primary) hypertension: Secondary | ICD-10-CM

## 2017-12-14 DIAGNOSIS — E1165 Type 2 diabetes mellitus with hyperglycemia: Secondary | ICD-10-CM

## 2017-12-14 DIAGNOSIS — Z6834 Body mass index (BMI) 34.0-34.9, adult: Secondary | ICD-10-CM

## 2017-12-14 DIAGNOSIS — Z794 Long term (current) use of insulin: Secondary | ICD-10-CM

## 2017-12-14 DIAGNOSIS — E118 Type 2 diabetes mellitus with unspecified complications: Secondary | ICD-10-CM

## 2017-12-14 DIAGNOSIS — IMO0002 Reserved for concepts with insufficient information to code with codable children: Secondary | ICD-10-CM | POA: Insufficient documentation

## 2017-12-14 LAB — BAYER DCA HB A1C WAIVED: HB A1C (BAYER DCA - WAIVED): 7.2 % — ABNORMAL HIGH (ref <7.0)

## 2017-12-14 MED ORDER — AMLODIPINE BESYLATE 2.5 MG PO TABS: 2.5000 mg | ORAL_TABLET | Freq: Every day | ORAL | 3 refills | Status: DC

## 2017-12-14 MED ORDER — JARDIANCE 25 MG PO TABS: 25.0000 mg | ORAL_TABLET | Freq: Every day | ORAL | 1 refills | Status: DC

## 2017-12-14 MED ORDER — METFORMIN HCL 500 MG PO TABS: 500.0000 mg | ORAL_TABLET | Freq: Two times a day (BID) | ORAL | 3 refills | Status: DC

## 2017-12-14 NOTE — Patient Instructions (Addendum)
Schedule Eye Exam  Check blood pressures at home.  Goal 120s/70s  Let me know if regularly >140 on top or >90 on bottom bring numbers to next clinic visit.

## 2017-12-14 NOTE — Progress Notes (Signed)
Subjective:   Patient ID: Natalie Yoder, female    DOB: 10/30/1975, 42 y.o.   MRN: [id number] CC: Follow-up (3 month) multiple med problems  HPI: Natalie Yoder is a 42 y.o. female presenting for Follow-up (3 month)  Spinal cord stimulator placed 2 months ago. Walking for more than 15 - 20 minutes pain in back returns. Able to move more than she was able to before the stimulator placed.  Still limited in her activities.  Not able to sit for longer than 10-15 minutes before she needs to.   Dm2: BGLs after the surgery got slightly elevated to upper 100s, low 200s. AM BGLs 70s-120s. Up to 130s if she eats later in the evening.   HTN: Not recently been on blood pressure medicine.  Does have some headaches off and on.    Headaches: Sometimes caffeine helps.  Takes Excedrin as well for headaches.  She had once  severe migraines years ago, they have improved over time.  Excedrin usually takes care of the migraine.  Elevated BMI: Trying to watch what she eats better.  Eating salads work.  Constipation: stooling about once a week, was on dilaudid after surgery, now off. She is taking tramadol, followed by back surgeon.  Relevant past medical, surgical, family and social history reviewed. Allergies and medications reviewed and updated. Social History   Tobacco Use  Smoking Status Never Smoker  Smokeless Tobacco Never Used   ROS: Per HPI   Objective:    BP 139/89   Pulse 91   Temp 97.7 F (36.5 C) (Oral)   Ht 5\' 6"  (1.676 m)   Wt 215 lb 3.2 oz (97.6 kg)   BMI 34.73 kg/m   Wt Readings from Last 3 Encounters:  12/14/17 215 lb 3.2 oz (97.6 kg)  11/08/17 215 lb (97.5 kg)  10/13/17 218 lb (98.9 kg)    Gen: NAD, alert, cooperative with exam, NCAT EYES: EOMI, no conjunctival injection, or no icterus ENT: OP without erythema LYMPH: no cervical LAD CV: NRRR, normal S1/S2, no murmur, distal pulses 2+ b/l Resp: CTABL, no wheezes, normal WOB Ext: No edema, warm Neuro: Alert and oriented,  strength equal b/l UE and LE, coordination grossly normal MSK: normal muscle bulk  Assessment & Plan:  Natalie Yoder was seen today for follow-up.  Diagnoses and all orders for this visit:  Essential hypertension We will start amlodipine 2.5 mg.  Check at home.  Bring numbers to next office visit. -     Microalbumin / creatinine urine ratio -     Lipid panel -     amLODipine (NORVASC) 2.5 MG tablet; Take 1 tablet (2.5 mg total) by mouth daily.  Diabetes mellitus type 2 with complications, uncontrolled (HCC) Continue medications.  A1c 7.2. -     Bayer DCA Hb A1c Waived -     Microalbumin / creatinine urine ratio -     Lipid panel -     JARDIANCE 25 MG TABS tablet; Take 25 mg by mouth at bedtime. -     metFORMIN (GLUCOPHAGE) 500 MG tablet; Take 1 tablet (500 mg total) by mouth 2 (two) times daily with a meal.  BMI 34.0-34.9,adult Lifestyle changes discussed. -     Microalbumin / creatinine urine ratio -     Lipid panel  Constipation, unspecified constipation type Take MiraLAX daily as needed.  May repeat if needed.  Increase water intake.  Increase fiber intake.  Follow up plan: Return in about 3 months (around 03/16/2018). Rex Krasarol Jairo Bellew, MD [facility]

## 2017-12-15 DIAGNOSIS — G894 Chronic pain syndrome: Secondary | ICD-10-CM | POA: Diagnosis not present

## 2017-12-15 DIAGNOSIS — M5136 Other intervertebral disc degeneration, lumbar region: Secondary | ICD-10-CM | POA: Diagnosis not present

## 2017-12-15 LAB — LIPID PANEL
CHOL/HDL RATIO: 2.6 ratio (ref 0.0–4.4)
Cholesterol, Total: 161 mg/dL (ref 100–199)
HDL: 61 mg/dL (ref >39)
LDL Calculated: 86 mg/dL (ref 0–99)
Triglycerides: 72 mg/dL (ref 0–149)
VLDL CHOLESTEROL CAL: 14 mg/dL (ref 5–40)

## 2017-12-15 LAB — MICROALBUMIN / CREATININE URINE RATIO
Creatinine, Urine: 88.5 mg/dL
Microalb/Creat Ratio: 7.8 mg/g creat (ref 0.0–30.0)
Microalbumin, Urine: 6.9 ug/mL

## 2018-01-05 DIAGNOSIS — M7552 Bursitis of left shoulder: Secondary | ICD-10-CM | POA: Diagnosis not present

## 2018-01-11 DIAGNOSIS — H5213 Myopia, bilateral: Secondary | ICD-10-CM | POA: Diagnosis not present

## 2018-02-12 DIAGNOSIS — R21 Rash and other nonspecific skin eruption: Secondary | ICD-10-CM | POA: Diagnosis not present

## 2018-02-12 DIAGNOSIS — W57XXXA Bitten or stung by nonvenomous insect and other nonvenomous arthropods, initial encounter: Secondary | ICD-10-CM | POA: Diagnosis not present

## 2018-02-16 DIAGNOSIS — M7552 Bursitis of left shoulder: Secondary | ICD-10-CM | POA: Diagnosis not present

## 2018-03-17 ENCOUNTER — Ambulatory Visit: Payer: Medicaid Other | Admitting: Pediatrics

## 2018-03-25 DIAGNOSIS — M5136 Other intervertebral disc degeneration, lumbar region: Secondary | ICD-10-CM | POA: Diagnosis not present

## 2018-03-25 DIAGNOSIS — G894 Chronic pain syndrome: Secondary | ICD-10-CM | POA: Diagnosis not present

## 2018-11-06 ENCOUNTER — Other Ambulatory Visit: Payer: Medicare Other

## 2018-11-08 ENCOUNTER — Ambulatory Visit: Payer: Medicare Other | Admitting: Endocrinology

## 2018-11-10 DIAGNOSIS — K047 Periapical abscess without sinus: Secondary | ICD-10-CM | POA: Diagnosis not present

## 2022-04-29 ENCOUNTER — Telehealth: Payer: Self-pay | Admitting: *Deleted

## 2022-04-29 NOTE — Patient Outreach (Signed)
  Care Management   Outreach Note  04/29/2022  Name: Natalie Yoder MRN: [id number] DOB: 1975/12/13  Reason for Referral: Care Coordination - Assessment of Needs.   An unsuccessful telephone outreach was attempted today. The patient was referred to the case management team for assistance with care management and care coordination. A HIPAA compliant message was left on voicemail for patient, providing contact information for CSW, encouraging patient to return the call at her earliest convenience.   Follow Up Plan:  Scheduling Care Guide will reschedule initial telephone outreach call for patient with CSW.   Danford Bad, BSW, MSW, LCSW  Licensed Restaurant manager, [employer] [facility]  Mailing [place] [address], [place], [place] [address] Physical Address-[address], [place], [place] [address] Toll Free Main # [phone] Fax # [phone] Cell # [phone] [email or web address]

## 2022-05-10 ENCOUNTER — Ambulatory Visit: Payer: Self-pay | Admitting: *Deleted

## 2022-05-10 ENCOUNTER — Encounter: Payer: Self-pay | Admitting: *Deleted

## 2022-05-10 NOTE — Patient Outreach (Signed)
  Care Coordination   Initial Visit Note   05/10/2022 Name: Natalie Yoder MRN: [id number] DOB: 1976/07/02  Natalie Yoder is a 46 y.o. year old female who sees No primary care provider on file. for primary care. I spoke with  Fonnie Mu by phone today.  What matters to the patients health and wellness today?  No Intervention Identified.   Goals Addressed   None     SDOH assessments and interventions completed:  Yes  SDOH Interventions Today    Flowsheet Row Most Recent Value  SDOH Interventions   Food Insecurity Interventions Intervention Not Indicated  Financial Strain Interventions Intervention Not Indicated  Housing Interventions Intervention Not Indicated  Physical Activity Interventions Patient Refused  Stress Interventions Intervention Not Indicated  Social Connections Interventions Intervention Not Indicated  Transportation Interventions Intervention Not Indicated        Care Coordination Interventions Activated:  No   Care Coordination Interventions:  No, not indicated.   Follow up plan: No further intervention required.   Encounter Outcome:  Pt. Visit Completed.   Danford Bad, BSW, MSW, LCSW  Licensed Restaurant manager, [employer] [facility]  Mailing [place] [address], [place], [place] [address] Physical Address[address] [place], [place] [address] Toll Free Main # [phone] Fax # [phone] Cell # [phone] [email or web address]

## 2022-05-10 NOTE — Patient Instructions (Signed)
Visit Information  Thank you for taking time to visit with me today. Please don't hesitate to contact me if I can be of assistance to you.   Please call the care guide team at [phone] if you need to cancel or reschedule your appointment.   If you are experiencing a Mental Health or Behavioral Health Crisis or need someone to talk to, please call the Suicide and Crisis Lifeline: 988 call the [place] National Suicide Prevention Lifeline: 1-800-273-8255 or TTY: 1-800-799-4 TTY (1-800-799-4889) to talk to a trained counselor call 1-800-273-TALK (toll free, 24 hour hotline) go to [facility] [address], [place] ([phone]) call the [place] Crisis Line: 800-939-9988 call 911  Patient verbalizes understanding of instructions and care plan provided today and agrees to view in MyChart. Active MyChart status and patient understanding of how to access instructions and care plan via MyChart confirmed with patient.     No further follow up required.  Duwan Adrian, BSW, MSW, LCSW  Licensed Clinical Social Worker  [facility]  Mailing Address-[address], [place], [place] [address] Physical Address-[address], [place], [place] [address] Toll Free Main # [phone] Fax # [phone] Cell # [phone] [email or web address]            

## 2022-08-10 ENCOUNTER — Other Ambulatory Visit: Payer: Self-pay

## 2022-08-13 ENCOUNTER — Other Ambulatory Visit: Payer: Self-pay

## 2022-08-13 MED ORDER — OZEMPIC (0.25 OR 0.5 MG/DOSE) 2 MG/3ML ~~LOC~~ SOPN
PEN_INJECTOR | SUBCUTANEOUS | 1 refills | Status: DC
  Filled 2022-08-13: qty 3, 42d supply, fill #0
  Filled 2022-09-20 – 2022-09-23 (×2): qty 3, 42d supply, fill #1

## 2022-08-13 MED ORDER — FREESTYLE LIBRE 2 SENSOR MISC
3 refills | Status: AC
  Filled 2022-08-13: qty 2, 28d supply, fill #0
  Filled 2022-09-06: qty 2, 28d supply, fill #1

## 2022-08-13 MED ORDER — JARDIANCE 10 MG PO TABS
10.0000 mg | ORAL_TABLET | Freq: Every day | ORAL | 3 refills | Status: DC
  Filled 2022-08-13: qty 30, 30d supply, fill #0

## 2022-08-16 ENCOUNTER — Other Ambulatory Visit: Payer: Self-pay

## 2022-08-17 ENCOUNTER — Other Ambulatory Visit: Payer: Self-pay

## 2022-09-01 ENCOUNTER — Other Ambulatory Visit: Payer: Self-pay

## 2022-09-03 ENCOUNTER — Other Ambulatory Visit: Payer: Self-pay

## 2022-09-06 ENCOUNTER — Other Ambulatory Visit: Payer: Self-pay

## 2022-09-07 ENCOUNTER — Other Ambulatory Visit: Payer: Self-pay

## 2022-09-08 ENCOUNTER — Other Ambulatory Visit: Payer: Self-pay

## 2022-09-09 ENCOUNTER — Other Ambulatory Visit: Payer: Self-pay

## 2022-09-15 ENCOUNTER — Other Ambulatory Visit: Payer: Self-pay

## 2022-09-15 MED ORDER — TOUJEO MAX SOLOSTAR 300 UNIT/ML ~~LOC~~ SOPN
PEN_INJECTOR | SUBCUTANEOUS | 3 refills | Status: DC
  Filled 2022-09-15: qty 3, 30d supply, fill #0
  Filled 2022-10-11: qty 3, 30d supply, fill #1
  Filled 2022-11-09: qty 3, 30d supply, fill #2
  Filled 2022-12-14: qty 3, 30d supply, fill #3
  Filled 2023-01-13: qty 3, 30d supply, fill #4
  Filled 2023-02-08: qty 3, 30d supply, fill #5
  Filled 2023-03-14: qty 3, 30d supply, fill #6
  Filled 2023-04-18: qty 3, 30d supply, fill #7
  Filled 2023-06-07: qty 3, 30d supply, fill #8
  Filled 2023-07-06: qty 3, 30d supply, fill #9
  Filled 2023-08-10: qty 3, 30d supply, fill #10
  Filled 2023-09-09: qty 3, 30d supply, fill #11

## 2022-09-15 MED ORDER — INSULIN PEN NEEDLE 32G X 4 MM MISC
Freq: Every day | 3 refills | Status: DC
  Filled 2022-09-15: qty 100, 90d supply, fill #0
  Filled 2023-01-31: qty 100, 90d supply, fill #1
  Filled 2023-02-23 (×2): qty 100, 90d supply, fill #2
  Filled 2023-07-06: qty 100, 90d supply, fill #3

## 2022-09-16 ENCOUNTER — Other Ambulatory Visit: Payer: Self-pay

## 2022-09-16 MED ORDER — FUROSEMIDE 20 MG PO TABS
20.0000 mg | ORAL_TABLET | Freq: Every day | ORAL | 3 refills | Status: DC
  Filled 2022-09-16: qty 30, 30d supply, fill #0
  Filled 2023-07-06: qty 30, 30d supply, fill #1
  Filled 2023-08-10: qty 30, 30d supply, fill #2

## 2022-09-20 ENCOUNTER — Other Ambulatory Visit: Payer: Self-pay

## 2022-09-28 ENCOUNTER — Other Ambulatory Visit: Payer: Self-pay

## 2022-10-11 ENCOUNTER — Other Ambulatory Visit: Payer: Self-pay

## 2022-10-13 ENCOUNTER — Other Ambulatory Visit: Payer: Self-pay

## 2022-10-19 ENCOUNTER — Other Ambulatory Visit: Payer: Self-pay

## 2022-10-19 MED ORDER — FREESTYLE LIBRE 3 SENSOR MISC
1.0000 | 5 refills | Status: DC
  Filled 2022-10-19: qty 2, 28d supply, fill #0
  Filled 2022-11-16: qty 2, 28d supply, fill #1
  Filled 2022-12-14: qty 2, 28d supply, fill #2
  Filled 2023-01-13: qty 2, 28d supply, fill #3
  Filled 2023-02-07: qty 2, 28d supply, fill #4
  Filled 2023-03-14: qty 2, 28d supply, fill #5

## 2022-10-29 ENCOUNTER — Other Ambulatory Visit: Payer: Self-pay

## 2022-10-29 MED ORDER — OZEMPIC (0.25 OR 0.5 MG/DOSE) 2 MG/3ML ~~LOC~~ SOPN
PEN_INJECTOR | SUBCUTANEOUS | 3 refills | Status: AC
  Filled 2022-10-29: qty 3, 28d supply, fill #0
  Filled 2022-11-04: qty 9, 84d supply, fill #0
  Filled 2022-11-04 – 2023-05-09 (×2): qty 3, 28d supply, fill #0
  Filled ????-??-?? (×2): fill #0

## 2022-11-01 ENCOUNTER — Other Ambulatory Visit: Payer: Self-pay

## 2022-11-04 ENCOUNTER — Other Ambulatory Visit: Payer: Self-pay

## 2022-11-04 MED ORDER — OZEMPIC (0.25 OR 0.5 MG/DOSE) 2 MG/3ML ~~LOC~~ SOPN
0.5000 mg | PEN_INJECTOR | SUBCUTANEOUS | 3 refills | Status: DC
  Filled 2022-11-04: qty 9, 84d supply, fill #0
  Filled 2023-03-14: qty 9, 84d supply, fill #1
  Filled 2023-05-16: qty 9, 84d supply, fill #2

## 2022-11-09 ENCOUNTER — Other Ambulatory Visit: Payer: Self-pay

## 2022-11-10 ENCOUNTER — Other Ambulatory Visit: Payer: Self-pay

## 2022-11-11 ENCOUNTER — Other Ambulatory Visit: Payer: Self-pay | Admitting: Internal Medicine

## 2022-11-11 ENCOUNTER — Other Ambulatory Visit: Payer: Self-pay

## 2022-11-11 DIAGNOSIS — Z1231 Encounter for screening mammogram for malignant neoplasm of breast: Secondary | ICD-10-CM

## 2022-11-16 ENCOUNTER — Other Ambulatory Visit: Payer: Self-pay

## 2022-11-17 ENCOUNTER — Other Ambulatory Visit: Payer: Self-pay

## 2022-11-17 MED ORDER — OZEMPIC (1 MG/DOSE) 4 MG/3ML ~~LOC~~ SOPN
PEN_INJECTOR | SUBCUTANEOUS | 5 refills | Status: DC
  Filled 2022-11-17: qty 3, 28d supply, fill #0
  Filled 2022-12-14: qty 3, 28d supply, fill #1
  Filled 2023-01-13: qty 3, 28d supply, fill #2
  Filled 2023-01-31 – 2023-02-03 (×2): qty 3, 28d supply, fill #3

## 2022-12-08 ENCOUNTER — Other Ambulatory Visit: Payer: Self-pay

## 2022-12-09 ENCOUNTER — Other Ambulatory Visit: Payer: Self-pay

## 2022-12-09 MED ORDER — PREDNISONE 20 MG PO TABS
ORAL_TABLET | ORAL | 0 refills | Status: AC
  Filled 2022-12-09: qty 12, 6d supply, fill #0

## 2022-12-14 ENCOUNTER — Other Ambulatory Visit: Payer: Self-pay

## 2022-12-14 MED ORDER — HYDROXYZINE HCL 25 MG PO TABS
25.0000 mg | ORAL_TABLET | Freq: Three times a day (TID) | ORAL | 0 refills | Status: AC
  Filled 2022-12-14: qty 30, 10d supply, fill #0

## 2022-12-16 ENCOUNTER — Other Ambulatory Visit: Payer: Self-pay

## 2022-12-16 MED ORDER — PREDNISONE 20 MG PO TABS
ORAL_TABLET | ORAL | 0 refills | Status: AC
  Filled 2022-12-16: qty 12, 6d supply, fill #0

## 2022-12-24 ENCOUNTER — Ambulatory Visit
Admission: RE | Admit: 2022-12-24 | Discharge: 2022-12-24 | Disposition: A | Discharge status: Home / Self Care | Payer: No Typology Code available for payment source | Source: Ambulatory Visit | Attending: Internal Medicine

## 2022-12-24 DIAGNOSIS — Z1231 Encounter for screening mammogram for malignant neoplasm of breast: Secondary | ICD-10-CM

## 2023-01-13 ENCOUNTER — Other Ambulatory Visit: Payer: Self-pay

## 2023-01-14 ENCOUNTER — Other Ambulatory Visit: Payer: Self-pay

## 2023-01-31 ENCOUNTER — Other Ambulatory Visit: Payer: Self-pay

## 2023-02-01 ENCOUNTER — Other Ambulatory Visit: Payer: Self-pay

## 2023-02-02 ENCOUNTER — Other Ambulatory Visit: Payer: Self-pay

## 2023-02-02 MED ORDER — ONDANSETRON HCL 8 MG PO TABS
8.0000 mg | ORAL_TABLET | Freq: Three times a day (TID) | ORAL | 0 refills | Status: AC
  Filled 2023-02-02: qty 30, 10d supply, fill #0

## 2023-02-03 ENCOUNTER — Other Ambulatory Visit: Payer: Self-pay

## 2023-02-07 ENCOUNTER — Other Ambulatory Visit: Payer: Self-pay

## 2023-02-08 ENCOUNTER — Other Ambulatory Visit: Payer: Self-pay

## 2023-02-23 ENCOUNTER — Other Ambulatory Visit: Payer: Self-pay

## 2023-03-14 ENCOUNTER — Other Ambulatory Visit: Payer: Self-pay

## 2023-03-16 ENCOUNTER — Other Ambulatory Visit: Payer: Self-pay

## 2023-03-16 DIAGNOSIS — R202 Paresthesia of skin: Secondary | ICD-10-CM | POA: Diagnosis not present

## 2023-03-16 DIAGNOSIS — M542 Cervicalgia: Secondary | ICD-10-CM | POA: Diagnosis not present

## 2023-03-16 MED ORDER — DICLOFENAC SODIUM 50 MG PO TBEC
50.0000 mg | DELAYED_RELEASE_TABLET | Freq: Two times a day (BID) | ORAL | 0 refills | Status: DC
  Filled 2023-03-16: qty 30, 15d supply, fill #0

## 2023-03-30 ENCOUNTER — Other Ambulatory Visit: Payer: Self-pay

## 2023-03-30 MED ORDER — ONDANSETRON HCL 8 MG PO TABS
8.0000 mg | ORAL_TABLET | Freq: Three times a day (TID) | ORAL | 0 refills | Status: DC
  Filled 2023-03-30: qty 30, 10d supply, fill #0

## 2023-04-18 ENCOUNTER — Other Ambulatory Visit: Payer: Self-pay

## 2023-04-19 ENCOUNTER — Other Ambulatory Visit: Payer: Self-pay

## 2023-04-28 DIAGNOSIS — M542 Cervicalgia: Secondary | ICD-10-CM | POA: Diagnosis not present

## 2023-05-05 DIAGNOSIS — M542 Cervicalgia: Secondary | ICD-10-CM | POA: Diagnosis not present

## 2023-05-09 ENCOUNTER — Other Ambulatory Visit: Payer: Self-pay

## 2023-05-09 MED ORDER — FREESTYLE LIBRE 3 SENSOR MISC
1.0000 | 5 refills | Status: DC
  Filled 2023-05-09: qty 2, 28d supply, fill #0
  Filled 2023-06-07: qty 2, 28d supply, fill #1
  Filled 2023-07-06: qty 2, 28d supply, fill #2
  Filled 2023-08-10: qty 2, 28d supply, fill #3
  Filled 2023-09-09: qty 2, 28d supply, fill #4
  Filled 2023-10-11: qty 2, 28d supply, fill #5

## 2023-05-09 MED ORDER — ONDANSETRON HCL 8 MG PO TABS
8.0000 mg | ORAL_TABLET | Freq: Three times a day (TID) | ORAL | 0 refills | Status: AC
  Filled 2023-08-24: qty 30, 10d supply, fill #0

## 2023-05-11 ENCOUNTER — Other Ambulatory Visit: Payer: Self-pay

## 2023-05-17 ENCOUNTER — Other Ambulatory Visit: Payer: Self-pay

## 2023-05-17 DIAGNOSIS — E1169 Type 2 diabetes mellitus with other specified complication: Secondary | ICD-10-CM | POA: Diagnosis not present

## 2023-05-17 DIAGNOSIS — I10 Essential (primary) hypertension: Secondary | ICD-10-CM | POA: Diagnosis not present

## 2023-05-17 DIAGNOSIS — E559 Vitamin D deficiency, unspecified: Secondary | ICD-10-CM | POA: Diagnosis not present

## 2023-05-17 DIAGNOSIS — E782 Mixed hyperlipidemia: Secondary | ICD-10-CM | POA: Diagnosis not present

## 2023-05-17 DIAGNOSIS — E041 Nontoxic single thyroid nodule: Secondary | ICD-10-CM | POA: Diagnosis not present

## 2023-05-18 ENCOUNTER — Other Ambulatory Visit: Payer: Self-pay

## 2023-05-18 MED ORDER — LOSARTAN POTASSIUM 50 MG PO TABS
50.0000 mg | ORAL_TABLET | Freq: Every day | ORAL | 5 refills | Status: DC
  Filled 2023-05-18: qty 30, 30d supply, fill #0
  Filled 2023-06-07 – 2023-06-13 (×2): qty 30, 30d supply, fill #1
  Filled 2023-07-12: qty 30, 30d supply, fill #2
  Filled 2023-08-10: qty 30, 30d supply, fill #3
  Filled 2023-09-09: qty 30, 30d supply, fill #4
  Filled 2023-10-11: qty 30, 30d supply, fill #5

## 2023-05-18 MED ORDER — DAPAGLIFLOZIN PROPANEDIOL 5 MG PO TABS
5.0000 mg | ORAL_TABLET | Freq: Every day | ORAL | 5 refills | Status: DC
  Filled 2023-05-18: qty 30, 30d supply, fill #0
  Filled 2023-06-07 – 2023-06-13 (×2): qty 30, 30d supply, fill #1
  Filled 2023-07-12: qty 30, 30d supply, fill #2
  Filled 2023-08-11: qty 30, 30d supply, fill #3
  Filled 2023-09-09: qty 30, 30d supply, fill #4
  Filled 2023-10-11: qty 30, 30d supply, fill #5

## 2023-05-19 DIAGNOSIS — M542 Cervicalgia: Secondary | ICD-10-CM | POA: Diagnosis not present

## 2023-05-26 DIAGNOSIS — M542 Cervicalgia: Secondary | ICD-10-CM | POA: Diagnosis not present

## 2023-06-02 DIAGNOSIS — M542 Cervicalgia: Secondary | ICD-10-CM | POA: Diagnosis not present

## 2023-06-07 ENCOUNTER — Other Ambulatory Visit: Payer: Self-pay

## 2023-06-09 ENCOUNTER — Other Ambulatory Visit: Payer: Self-pay

## 2023-06-09 DIAGNOSIS — M542 Cervicalgia: Secondary | ICD-10-CM | POA: Diagnosis not present

## 2023-06-13 ENCOUNTER — Other Ambulatory Visit: Payer: Self-pay

## 2023-06-14 ENCOUNTER — Other Ambulatory Visit: Payer: Self-pay

## 2023-06-28 ENCOUNTER — Other Ambulatory Visit: Payer: Self-pay

## 2023-07-06 ENCOUNTER — Other Ambulatory Visit: Payer: Self-pay

## 2023-07-07 ENCOUNTER — Other Ambulatory Visit: Payer: Self-pay

## 2023-07-12 ENCOUNTER — Other Ambulatory Visit: Payer: Self-pay

## 2023-07-15 ENCOUNTER — Other Ambulatory Visit: Payer: Self-pay

## 2023-07-18 ENCOUNTER — Other Ambulatory Visit: Payer: Self-pay

## 2023-08-08 DIAGNOSIS — E1169 Type 2 diabetes mellitus with other specified complication: Secondary | ICD-10-CM | POA: Diagnosis not present

## 2023-08-08 DIAGNOSIS — I10 Essential (primary) hypertension: Secondary | ICD-10-CM | POA: Diagnosis not present

## 2023-08-08 DIAGNOSIS — G43909 Migraine, unspecified, not intractable, without status migrainosus: Secondary | ICD-10-CM | POA: Diagnosis not present

## 2023-08-10 ENCOUNTER — Other Ambulatory Visit: Payer: Self-pay

## 2023-08-10 MED ORDER — INSULIN PEN NEEDLE 32G X 4 MM MISC
3 refills | Status: AC
  Filled 2023-08-10: qty 100, 90d supply, fill #0
  Filled 2024-02-07: qty 100, 100d supply, fill #0
  Filled 2024-06-11: qty 100, 100d supply, fill #1

## 2023-08-11 ENCOUNTER — Other Ambulatory Visit: Payer: Self-pay

## 2023-08-12 ENCOUNTER — Other Ambulatory Visit: Payer: Self-pay

## 2023-08-12 ENCOUNTER — Other Ambulatory Visit (HOSPITAL_COMMUNITY): Payer: Self-pay

## 2023-08-12 MED ORDER — UBRELVY 100 MG PO TABS
ORAL_TABLET | ORAL | 1 refills | Status: DC
  Filled 2023-08-12: qty 8, 30d supply, fill #0
  Filled 2023-09-09: qty 8, 30d supply, fill #1

## 2023-08-16 DIAGNOSIS — E782 Mixed hyperlipidemia: Secondary | ICD-10-CM | POA: Diagnosis not present

## 2023-08-16 DIAGNOSIS — E041 Nontoxic single thyroid nodule: Secondary | ICD-10-CM | POA: Diagnosis not present

## 2023-08-16 DIAGNOSIS — I10 Essential (primary) hypertension: Secondary | ICD-10-CM | POA: Diagnosis not present

## 2023-08-16 DIAGNOSIS — E1165 Type 2 diabetes mellitus with hyperglycemia: Secondary | ICD-10-CM | POA: Diagnosis not present

## 2023-08-19 DIAGNOSIS — E1165 Type 2 diabetes mellitus with hyperglycemia: Secondary | ICD-10-CM | POA: Diagnosis not present

## 2023-08-24 ENCOUNTER — Other Ambulatory Visit: Payer: Self-pay

## 2023-08-25 ENCOUNTER — Other Ambulatory Visit: Payer: Self-pay

## 2023-09-09 ENCOUNTER — Other Ambulatory Visit: Payer: Self-pay

## 2023-09-12 ENCOUNTER — Other Ambulatory Visit: Payer: Self-pay

## 2023-09-13 ENCOUNTER — Other Ambulatory Visit: Payer: Self-pay

## 2023-09-13 MED ORDER — MOUNJARO 5 MG/0.5ML ~~LOC~~ SOAJ
5.0000 mg | SUBCUTANEOUS | 1 refills | Status: DC
  Filled 2023-09-13: qty 2, 28d supply, fill #0
  Filled 2023-10-11: qty 2, 28d supply, fill #1

## 2023-09-14 ENCOUNTER — Other Ambulatory Visit: Payer: Self-pay

## 2023-09-23 ENCOUNTER — Other Ambulatory Visit: Payer: Self-pay

## 2023-10-03 ENCOUNTER — Other Ambulatory Visit: Payer: Self-pay

## 2023-10-11 ENCOUNTER — Other Ambulatory Visit: Payer: Self-pay

## 2023-10-11 MED ORDER — ONDANSETRON HCL 8 MG PO TABS
ORAL_TABLET | ORAL | 3 refills | Status: AC
  Filled 2023-10-11: qty 30, 10d supply, fill #0
  Filled 2024-01-09: qty 30, 10d supply, fill #1
  Filled 2024-04-03: qty 30, 10d supply, fill #2
  Filled 2024-05-10: qty 30, 10d supply, fill #3

## 2023-10-11 MED ORDER — TOUJEO MAX SOLOSTAR 300 UNIT/ML ~~LOC~~ SOPN
PEN_INJECTOR | SUBCUTANEOUS | 3 refills | Status: DC
  Filled 2023-10-11: qty 12, 90d supply, fill #0
  Filled 2024-01-09: qty 12, 90d supply, fill #1
  Filled 2024-04-03: qty 12, 90d supply, fill #2
  Filled 2024-07-17: qty 12, 90d supply, fill #3

## 2023-10-14 ENCOUNTER — Other Ambulatory Visit: Payer: Self-pay

## 2023-11-07 ENCOUNTER — Other Ambulatory Visit: Payer: Self-pay

## 2023-11-07 MED ORDER — LOSARTAN POTASSIUM 50 MG PO TABS
50.0000 mg | ORAL_TABLET | Freq: Every day | ORAL | 5 refills | Status: DC
  Filled 2023-11-07: qty 30, 30d supply, fill #0
  Filled 2023-12-07: qty 30, 30d supply, fill #1
  Filled 2024-01-09: qty 30, 30d supply, fill #2
  Filled 2024-02-07: qty 30, 30d supply, fill #3
  Filled 2024-04-03: qty 30, 30d supply, fill #4
  Filled 2024-05-10: qty 30, 30d supply, fill #5

## 2023-11-07 MED ORDER — FREESTYLE LIBRE 3 SENSOR MISC
5 refills | Status: AC
  Filled 2023-11-07: qty 2, 28d supply, fill #0
  Filled 2023-12-07: qty 2, 28d supply, fill #1
  Filled 2024-01-09: qty 2, 28d supply, fill #2
  Filled 2024-02-07: qty 2, 28d supply, fill #3
  Filled 2024-03-05: qty 2, 28d supply, fill #4
  Filled 2024-04-03: qty 2, 28d supply, fill #5

## 2023-11-07 MED ORDER — MOUNJARO 5 MG/0.5ML ~~LOC~~ SOAJ
5.0000 mg | SUBCUTANEOUS | 1 refills | Status: DC
  Filled 2023-11-07: qty 2, 28d supply, fill #0
  Filled 2023-12-07: qty 2, 28d supply, fill #1

## 2023-11-09 ENCOUNTER — Other Ambulatory Visit: Payer: Self-pay

## 2023-11-15 DIAGNOSIS — E782 Mixed hyperlipidemia: Secondary | ICD-10-CM | POA: Diagnosis not present

## 2023-11-15 DIAGNOSIS — E559 Vitamin D deficiency, unspecified: Secondary | ICD-10-CM | POA: Diagnosis not present

## 2023-11-15 DIAGNOSIS — E041 Nontoxic single thyroid nodule: Secondary | ICD-10-CM | POA: Diagnosis not present

## 2023-11-15 DIAGNOSIS — E1165 Type 2 diabetes mellitus with hyperglycemia: Secondary | ICD-10-CM | POA: Diagnosis not present

## 2023-11-15 DIAGNOSIS — E11649 Type 2 diabetes mellitus with hypoglycemia without coma: Secondary | ICD-10-CM | POA: Diagnosis not present

## 2023-11-16 ENCOUNTER — Other Ambulatory Visit: Payer: Self-pay

## 2023-11-16 DIAGNOSIS — E1165 Type 2 diabetes mellitus with hyperglycemia: Secondary | ICD-10-CM | POA: Diagnosis not present

## 2023-11-16 DIAGNOSIS — Z0000 Encounter for general adult medical examination without abnormal findings: Secondary | ICD-10-CM | POA: Diagnosis not present

## 2023-11-16 DIAGNOSIS — G43109 Migraine with aura, not intractable, without status migrainosus: Secondary | ICD-10-CM | POA: Diagnosis not present

## 2023-11-16 DIAGNOSIS — T7840XA Allergy, unspecified, initial encounter: Secondary | ICD-10-CM | POA: Diagnosis not present

## 2023-11-16 DIAGNOSIS — E041 Nontoxic single thyroid nodule: Secondary | ICD-10-CM | POA: Diagnosis not present

## 2023-11-16 DIAGNOSIS — K219 Gastro-esophageal reflux disease without esophagitis: Secondary | ICD-10-CM | POA: Diagnosis not present

## 2023-11-16 DIAGNOSIS — Z981 Arthrodesis status: Secondary | ICD-10-CM | POA: Diagnosis not present

## 2023-11-16 DIAGNOSIS — M5412 Radiculopathy, cervical region: Secondary | ICD-10-CM | POA: Diagnosis not present

## 2023-11-16 DIAGNOSIS — Z9689 Presence of other specified functional implants: Secondary | ICD-10-CM | POA: Diagnosis not present

## 2023-11-16 MED ORDER — MELOXICAM 15 MG PO TABS
15.0000 mg | ORAL_TABLET | Freq: Every day | ORAL | 0 refills | Status: AC
Start: 2023-11-16 — End: ?
  Filled 2023-11-16: qty 30, 30d supply, fill #0

## 2023-11-17 ENCOUNTER — Other Ambulatory Visit: Payer: Self-pay

## 2023-12-06 DIAGNOSIS — M25512 Pain in left shoulder: Secondary | ICD-10-CM | POA: Diagnosis not present

## 2023-12-07 ENCOUNTER — Other Ambulatory Visit: Payer: Self-pay

## 2023-12-08 ENCOUNTER — Other Ambulatory Visit: Payer: Self-pay

## 2023-12-15 ENCOUNTER — Other Ambulatory Visit: Payer: Self-pay

## 2023-12-15 DIAGNOSIS — J988 Other specified respiratory disorders: Secondary | ICD-10-CM | POA: Diagnosis not present

## 2023-12-15 MED ORDER — IPRATROPIUM BROMIDE 0.03 % NA SOLN
2.0000 | Freq: Two times a day (BID) | NASAL | 1 refills | Status: AC
  Filled 2023-12-15: qty 30, 75d supply, fill #0
  Filled 2024-01-09 – 2024-03-05 (×2): qty 30, 75d supply, fill #1

## 2023-12-15 MED ORDER — DOXYCYCLINE MONOHYDRATE 100 MG PO CAPS
100.0000 mg | ORAL_CAPSULE | Freq: Two times a day (BID) | ORAL | 0 refills | Status: DC
  Filled 2023-12-15: qty 20, 10d supply, fill #0

## 2023-12-16 ENCOUNTER — Other Ambulatory Visit: Payer: Self-pay

## 2024-01-09 ENCOUNTER — Other Ambulatory Visit: Payer: Self-pay

## 2024-01-09 MED ORDER — MOUNJARO 5 MG/0.5ML ~~LOC~~ SOAJ
5.0000 mg | SUBCUTANEOUS | 1 refills | Status: DC
Start: 2024-01-09 — End: 2024-03-05
  Filled 2024-01-09: qty 2, 28d supply, fill #0
  Filled 2024-02-07: qty 2, 28d supply, fill #1

## 2024-01-09 MED ORDER — FUROSEMIDE 20 MG PO TABS
20.0000 mg | ORAL_TABLET | Freq: Every day | ORAL | 3 refills | Status: DC
  Filled 2024-01-09: qty 30, 30d supply, fill #0
  Filled 2024-04-03: qty 30, 30d supply, fill #1
  Filled 2024-05-10: qty 30, 30d supply, fill #2
  Filled 2024-06-11: qty 30, 30d supply, fill #3

## 2024-01-10 DIAGNOSIS — R790 Abnormal level of blood mineral: Secondary | ICD-10-CM | POA: Diagnosis not present

## 2024-01-11 ENCOUNTER — Other Ambulatory Visit: Payer: Self-pay

## 2024-01-11 MED ORDER — FERROUS SULFATE 325 (65 FE) MG PO TABS
325.0000 mg | ORAL_TABLET | Freq: Every day | ORAL | 1 refills | Status: AC
  Filled 2024-01-11: qty 30, 30d supply, fill #0
  Filled 2024-02-07: qty 30, 30d supply, fill #1

## 2024-01-23 ENCOUNTER — Other Ambulatory Visit: Payer: Self-pay

## 2024-01-23 MED ORDER — IBUPROFEN 800 MG PO TABS
800.0000 mg | ORAL_TABLET | Freq: Three times a day (TID) | ORAL | 1 refills | Status: DC
  Filled 2024-01-23: qty 30, 10d supply, fill #0

## 2024-01-23 MED ORDER — ACETAMINOPHEN EXTRA STRENGTH 500 MG PO CAPS: 1000.0000 mg | ORAL_CAPSULE | Freq: Three times a day (TID) | ORAL | 1 refills | Status: AC

## 2024-01-25 ENCOUNTER — Emergency Department (HOSPITAL_COMMUNITY): Admission: EM | Admit: 2024-01-25 | Discharge: 2024-01-25 | Disposition: A | Discharge status: Home / Self Care

## 2024-01-25 ENCOUNTER — Emergency Department (HOSPITAL_COMMUNITY)

## 2024-01-25 ENCOUNTER — Other Ambulatory Visit: Payer: Self-pay

## 2024-01-25 ENCOUNTER — Encounter (HOSPITAL_COMMUNITY): Payer: Self-pay

## 2024-01-25 DIAGNOSIS — Z794 Long term (current) use of insulin: Secondary | ICD-10-CM | POA: Diagnosis not present

## 2024-01-25 DIAGNOSIS — R102 Pelvic and perineal pain: Secondary | ICD-10-CM | POA: Diagnosis present

## 2024-01-25 LAB — CBC WITH DIFFERENTIAL/PLATELET
Abs Immature Granulocytes: 0.02 10*3/uL (ref 0.00–0.07)
Basophils Absolute: 0.1 10*3/uL (ref 0.0–0.1)
Basophils Relative: 1 %
Eosinophils Absolute: 0.2 10*3/uL (ref 0.0–0.5)
Eosinophils Relative: 3 %
HCT: 38.2 % (ref 36.0–46.0)
Hemoglobin: 12.2 g/dL (ref 12.0–15.0)
Immature Granulocytes: 0 %
Lymphocytes Relative: 41 %
Lymphs Abs: 3.1 10*3/uL (ref 0.7–4.0)
MCH: 27.1 pg (ref 26.0–34.0)
MCHC: 31.9 g/dL (ref 30.0–36.0)
MCV: 84.7 fL (ref 80.0–100.0)
Monocytes Absolute: 0.6 10*3/uL (ref 0.1–1.0)
Monocytes Relative: 8 %
Neutro Abs: 3.5 10*3/uL (ref 1.7–7.7)
Neutrophils Relative %: 47 %
Platelets: 547 10*3/uL — ABNORMAL HIGH (ref 150–400)
RBC: 4.51 MIL/uL (ref 3.87–5.11)
RDW: 14.3 % (ref 11.5–15.5)
WBC: 7.5 10*3/uL (ref 4.0–10.5)
nRBC: 0 % (ref 0.0–0.2)

## 2024-01-25 LAB — COMPREHENSIVE METABOLIC PANEL WITH GFR
ALT: 17 U/L (ref 0–44)
AST: 20 U/L (ref 15–41)
Albumin: 3.8 g/dL (ref 3.5–5.0)
Alkaline Phosphatase: 63 U/L (ref 38–126)
Anion gap: 9 (ref 5–15)
BUN: 5 mg/dL — ABNORMAL LOW (ref 6–20)
CO2: 26 mmol/L (ref 22–32)
Calcium: 9.1 mg/dL (ref 8.9–10.3)
Chloride: 105 mmol/L (ref 98–111)
Creatinine, Ser: 0.75 mg/dL (ref 0.44–1.00)
GFR, Estimated: >60 mL/min (ref >60)
Glucose, Bld: 91 mg/dL (ref 70–99)
Potassium: 4.4 mmol/L (ref 3.5–5.1)
Sodium: 140 mmol/L (ref 135–145)
Total Bilirubin: 0.4 mg/dL (ref 0.0–1.2)
Total Protein: 7.5 g/dL (ref 6.5–8.1)

## 2024-01-25 LAB — URINALYSIS, ROUTINE W REFLEX MICROSCOPIC
Bacteria, UA: NONE SEEN
Bilirubin Urine: NEGATIVE
Glucose, UA: >=500 mg/dL — AB
Hgb urine dipstick: NEGATIVE
Ketones, ur: NEGATIVE mg/dL
Leukocytes,Ua: NEGATIVE
Nitrite: NEGATIVE
Protein, ur: NEGATIVE mg/dL
Specific Gravity, Urine: 1.008 (ref 1.005–1.030)
pH: 6 (ref 5.0–8.0)

## 2024-01-25 LAB — LIPASE, BLOOD: Lipase: 97 U/L — ABNORMAL HIGH (ref 11–51)

## 2024-01-25 LAB — PREGNANCY, URINE: Preg Test, Ur: NEGATIVE

## 2024-01-25 MED ORDER — NAPROXEN 250 MG PO TABS
500.0000 mg | ORAL_TABLET | Freq: Once | ORAL | Status: AC
  Administered 2024-01-25: 500 mg via ORAL
  Filled 2024-01-25: qty 2

## 2024-01-25 MED ORDER — ONDANSETRON HCL 4 MG/2ML IJ SOLN
4.0000 mg | Freq: Once | INTRAMUSCULAR | Status: DC
  Filled 2024-01-25: qty 2

## 2024-01-25 MED ORDER — KETOROLAC TROMETHAMINE 30 MG/ML IJ SOLN
15.0000 mg | Freq: Once | INTRAMUSCULAR | Status: DC
  Filled 2024-01-25: qty 1

## 2024-01-25 MED ORDER — ONDANSETRON 4 MG PO TBDP
4.0000 mg | ORAL_TABLET | Freq: Once | ORAL | Status: AC
  Administered 2024-01-25: 4 mg via ORAL
  Filled 2024-01-25: qty 1

## 2024-01-25 NOTE — ED Triage Notes (Signed)
 Pt has hx of cysts and fibroids. Pt had transvaginal US  last week and states ever since has been in pain. Denies bleeding.

## 2024-01-25 NOTE — ED Provider Triage Note (Signed)
 Emergency Medicine Provider Triage Evaluation Note  Natalie Yoder , a 48 y.o. female  was evaluated in triage.  Pt complains of abdominal cramping. Ongoing abdominal cramping since April 17th after she was seen by [facility] for vaginal bleeding and had a transvaginal US .  Denies fever, nausea, dysuria or worsening vaginal bleeding.    Review of Systems  Positive: As above Negative: As above  Physical Exam  BP (!) 170/86 (BP Location: Left Arm)   Pulse (!) 101   Temp 97.7 F (36.5 C)   Resp 17   Ht 5\' 6"  (1.676 m)   Wt 94.8 kg   SpO2 100%   BMI 33.73 kg/m  Gen:   Awake, no distress   Resp:  Normal effort  MSK:   Moves extremities without difficulty  Other:    Medical Decision Making  Medically screening exam initiated at 12:32 PM.  Appropriate orders placed.  Cortlynn Hollinsworth was informed that the remainder of the evaluation will be completed by another provider, this initial triage assessment does not replace that evaluation, and the importance of remaining in the ED until their evaluation is complete.     Debbra Fairy, PA-C 01/25/24 1233

## 2024-01-25 NOTE — ED Provider Notes (Signed)
 [place] EMERGENCY DEPARTMENT AT [facility] Provider Note   CSN: [id number] Arrival date & time: 01/25/24  1034    History  Chief Complaint  Patient presents with   Vaginal Pain    Natalie Yoder is a 48 y.o. female here for evaluation of pelvic pain.  States she had a transvaginal ultrasound done on 4/17 for prolonged menstrual cycle.  Was told she had cysts bilaterally as well as fibroids.  Has had continued pain since then.  She called her OB/GYN today who told her to come to the emergency department to r/o ovarian torsion given cyst on her ovaries. No fever, N/V, dysuria, hematuria, diarrhea, worsening vag bleeding. No syncope. Pain constant, aching to lower abd. No dc concern for STI.  [facility]  HPI     Home Medications Prior to Admission medications   Medication Sig Start Date End Date Taking? Authorizing Provider  Acetaminophen  Extra Strength 500 MG CAPS Take 1,000 mg by mouth every 8 (eight) hours. 01/23/24     albuterol  (PROVENTIL  HFA;VENTOLIN  HFA) 108 (90 BASE) MCG/ACT inhaler Inhale 2 puffs into the lungs every 4 (four) hours as needed for wheezing or shortness of breath (cough, shortness of breath or wheezing.). 11/21/13   Corrinne Din, MD  amLODipine  (NORVASC ) 2.5 MG tablet Take 1 tablet (2.5 mg total) by mouth daily. 12/14/17   Domenic Fridge, MD  baclofen (LIORESAL) 10 MG tablet Take 10 mg by mouth 3 (three) times daily as needed for muscle spasms.     [provider]  Black Pepper-Turmeric (TURMERIC COMPLEX/BLACK PEPPER PO) Take 2 capsules by mouth at bedtime. Turmeric curcumin/Ginger/Black Pepper    [provider]  Continuous Blood Gluc Sensor (FREESTYLE LIBRE 2 SENSOR) MISC USE AS DIRECTED , CHANGE EVERY 14 DAYS 08/13/22   Pahwani, Regino Caprio, MD  Continuous Glucose Sensor (FREESTYLE LIBRE 3 SENSOR) MISC Use 1 sensor to monitor blood sugars every 14 days 11/07/23   Laurence Pons, MD  dapagliflozin  propanediol (FARXIGA ) 5 MG TABS tablet  Take 1 tablet (5 mg total) by mouth daily. 05/18/23     diclofenac  (VOLTAREN ) 50 MG EC tablet Take 1 tablet (50 mg total) by mouth 2 (two) times daily with food. 03/16/23     doxycycline  (MONODOX ) 100 MG capsule Take 1 capsule (100 mg total) by mouth 2 (two) times daily. 12/15/23     empagliflozin  (JARDIANCE ) 10 MG TABS tablet Take 1 tablet (10 mg total) by mouth daily. 08/13/22     ferrous sulfate  325 (65 FE) MG tablet Take 1 tablet (325 mg total) by mouth daily. 01/11/24     furosemide  (LASIX ) 20 MG tablet Take 1 tablet (20 mg total) by mouth daily. 01/09/24     glucose blood (ONE TOUCH ULTRA TEST) test strip Use as instructed 04/28/16   Domenic Fridge, MD  hydrOXYzine  (ATARAX ) 25 MG tablet Take 1 tablet (25 mg total) by mouth up to 3 (three) times daily for itching/rash. 12/14/22     ibuprofen  (ADVIL ) 800 MG tablet Take 1 tablet (800 mg total) by mouth every 8 (eight) hours with food or milk. 01/23/24     ibuprofen  (ADVIL ,MOTRIN ) 800 MG tablet Take 1 tablet by mouth every 6 (six) hours as needed.    [provider]  insulin  glargine, 2 Unit Dial , (TOUJEO  MAX SOLOSTAR) 300 UNIT/ML Solostar Pen Inject 40 units under the skin once daily 90 days 10/11/23     Insulin  Pen Needle 32G X 4 MM MISC USE AS  DIRECTED 08/10/23   Laurence Pons, MD  ipratropium (ATROVENT ) 0.03 % nasal spray Place 2 sprays into the nose 2 (two) times daily. 12/15/23     JARDIANCE  25 MG TABS tablet Take 25 mg by mouth at bedtime. 12/14/17   Domenic Fridge, MD  Lancets [facility] ULTRASOFT) lancets Use as instructed 04/28/16   Domenic Fridge, MD  losartan  (COZAAR ) 50 MG tablet Take 1 tablet (50 mg total) by mouth daily. 11/07/23     meloxicam  (MOBIC ) 15 MG tablet Take 1 tablet (15 mg total) by mouth daily. 11/16/23     metFORMIN  (GLUCOPHAGE ) 500 MG tablet Take 1 tablet (500 mg total) by mouth 2 (two) times daily with a meal. 12/14/17   Domenic Fridge, MD  Multiple Vitamin (MULTIVITAMIN WITH MINERALS) TABS tablet Take 1 tablet by mouth  at bedtime.    [provider]  ondansetron  (ZOFRAN  ODT) 4 MG disintegrating tablet Take 1 tablet (4 mg total) by mouth every 8 (eight) hours as needed for nausea or vomiting. 10/13/17   [name], Warner Haas, PA-C  ondansetron  (ZOFRAN ) 8 MG tablet Take 1 tablet (8 mg total) by mouth 3 (three) times daily for nausea for 10 days. 10/11/23     Semaglutide , 1 MG/DOSE, (OZEMPIC , 1 MG/DOSE,) 4 MG/3ML SOPN Inject 1 mg into the skin once a week 11/16/22     Semaglutide ,0.25 or 0.5MG /DOS, (OZEMPIC , 0.25 OR 0.5 MG/DOSE,) 2 MG/3ML SOPN USE AS DIRECTED PER DOCTOR: INJECT 0.25MG  FOR 4WKS AND THEN 0.5MG  THE NEXT 4WKS 08/13/22     Semaglutide ,0.25 or 0.5MG /DOS, (OZEMPIC , 0.25 OR 0.5 MG/DOSE,) 2 MG/3ML SOPN Inject 0.5 mg into the skin once a week. 11/04/22     tirzepatide  (MOUNJARO ) 5 MG/0.5ML Pen Inject 5 mg into the skin once a week. 01/09/24     traMADol (ULTRAM) 50 MG tablet Take 1 tablet by mouth every 8 (eight) hours as needed.    [provider]  Ubrogepant  (UBRELVY ) 100 MG TABS Take 1 tablet by mouth as needed. May take second dose at least 2 hours after first dose up to 2 tablets per day as needed Orally Once a day 30 days 08/12/23         Allergies    Fentanyl , Hydrocodone, Methocarbamol , Penicillins, Adhesive [tape], Oxycodone  hcl, and Exenatide    Review of Systems   Review of Systems  Constitutional: Negative.   HENT: Negative.    Respiratory: Negative.    Cardiovascular: Negative.   Gastrointestinal: Negative.   Genitourinary:  Positive for menstrual problem, pelvic pain and vaginal bleeding. Negative for decreased urine volume, difficulty urinating, dysuria, flank pain, frequency, genital sores, hematuria, urgency and vaginal pain.  Musculoskeletal: Negative.   Neurological: Negative.   All other systems reviewed and are negative.   Physical Exam Updated Vital Signs BP 136/71 (BP Location: Right Arm)   Pulse 88   Temp 97.9 F (36.6 C) (Oral)   Resp 18   Ht 5\' 6"  (1.676 m)    Wt 94.8 kg   SpO2 100%   BMI 33.73 kg/m  Physical Exam Vitals and nursing note reviewed.  Constitutional:      General: She is not in acute distress.    Appearance: She is well-developed. She is not ill-appearing, toxic-appearing or diaphoretic.  HENT:     Head: Atraumatic.  Eyes:     Pupils: Pupils are equal, round, and reactive to light.  Cardiovascular:     Rate and Rhythm: Normal rate.     Pulses: Normal pulses.  Heart sounds: Normal heart sounds.  Pulmonary:     Effort: Pulmonary effort is normal. No respiratory distress.     Breath sounds: Normal breath sounds.  Abdominal:     General: Bowel sounds are normal. There is no distension.     Palpations: Abdomen is soft.     Tenderness: There is no abdominal tenderness. There is no right CVA tenderness, left CVA tenderness or guarding.  Genitourinary:    Comments: Declined pelvic exam Musculoskeletal:        General: Normal range of motion.     Cervical back: Normal range of motion.  Skin:    General: Skin is warm and dry.  Neurological:     General: No focal deficit present.     Mental Status: She is alert.  Psychiatric:        Mood and Affect: Mood normal.     ED Results / Procedures / Treatments   Labs (all labs ordered are listed, but only abnormal results are displayed) Labs Reviewed  CBC WITH DIFFERENTIAL/PLATELET - Abnormal; Notable for the following components:      Result Value   Platelets 547 (*)    All other components within normal limits  COMPREHENSIVE METABOLIC PANEL WITH GFR - Abnormal; Notable for the following components:   BUN 5 (*)    All other components within normal limits  LIPASE, BLOOD - Abnormal; Notable for the following components:   Lipase 97 (*)    All other components within normal limits  URINALYSIS, ROUTINE W REFLEX MICROSCOPIC - Abnormal; Notable for the following components:   Color, Urine STRAW (*)    Glucose, UA >=500 (*)    All other components within normal limits   PREGNANCY, URINE    EKG None  Radiology US  PELVIC COMPLETE W TRANSVAGINAL AND TORSION R/O Result Date: 01/25/2024 CLINICAL DATA:  Pelvic pain EXAM: TRANSABDOMINAL AND TRANSVAGINAL ULTRASOUND OF PELVIS DOPPLER ULTRASOUND OF OVARIES TECHNIQUE: Both transabdominal and transvaginal ultrasound examinations of the pelvis were performed. Transabdominal technique was performed for global imaging of the pelvis including uterus, ovaries, adnexal regions, and pelvic cul-de-sac. It was necessary to proceed with endovaginal exam following the transabdominal exam to visualize the uterus endometrium adnexa. Color and duplex Doppler ultrasound was utilized to evaluate blood flow to the ovaries. COMPARISON:  None Available. FINDINGS: Uterus Measurements: 10.8 x 5.1 x 6.3 cm = volume: 182.3 mL. Small uterine fibroids. Left lower uterine segment exophytic fibroid measuring 2 x 1.7 x 1.6 cm. Subserosal fundal fibroid measuring 1.7 x 1 x 1.3 cm. Endometrium Thickness: 15.3 mm.  No focal abnormality visualized. Right ovary Measurements: 6.4 x 3.1 x 4.9 cm = volume: 50.30 mL. Anechoic cyst measuring 5 x 2.9 x 4.1 cm. Left ovary Not seen Pulsed Doppler evaluation of the right ovary demonstrates normal low-resistance arterial and venous waveforms. Other findings No abnormal free fluid. IMPRESSION: 1. Negative for right ovarian torsion. 5 cm simple appearing right ovarian cyst. No follow up imaging recommended. Note: This recommendation does not apply to premenarchal patients or to those with increased risk (genetic, family history, elevated tumor markers or other high-risk factors) of ovarian cancer. Reference: Radiology 2019 Nov; 293(2):359-371. 2. Small uterine fibroids. 3. Nonvisualized left ovary Electronically Signed   By: Esmeralda Hedge M.D.   On: 01/25/2024 19:12    Procedures Procedures    Medications Ordered in ED Medications  naproxen  (NAPROSYN ) tablet 500 mg (500 mg Oral Given 01/25/24 1851)  ondansetron   (ZOFRAN -ODT) disintegrating tablet 4 mg (4 mg  Oral Given 01/25/24 1851)    ED Course/ Medical Decision Making/ A&P   48 year old here for pelvic pain s/p TVUS from [facility] for prolonged menstrual cycle. Dx w/ BIL ovarian cyst and uterine fibroids. Pain constant, aching. Told to come here for repeat US  to r/o torsion given size on US .  Plan on labs, imaging and reassess  Labs and imaging personally viewed and interpreted:  CBC without leukocytosis metabolic panel without significant abnormality Lipase 97--no upper abdominal pain, nausea or vomiting Preg neg Urine glucosuria without infection, blood  Patient reassessed. Pain proved.  We discussed ultrasound with nonvisualization of the left ovary however the right ovary had good blood flow.  Could certainly have pain from the fact that she does have known ovarian cyst. Patient has no focal pain to the left lower quadrant.  We discussed pelvic exam which patient declined.  Had STI swabs at GYN this week apparently, we will hold on repeating here will have her follow-up outpatient with OB/GYN.  Will start anti-inflammatory.  No history of GI bleeds, gastric bypass, ulcerations.  She will return for new or worsening symptoms.  On repeat exam patient does not have a surgical abdomin and there are no peritoneal signs.  No indication of appendicitis, bowel obstruction, bowel perforation, cholecystitis, diverticulitis, PID, intermittent/persistent torsion, TOA or ectopic pregnancy.  Patient discharged home with symptomatic treatment and given strict instructions for follow-up with their primary care physician.  I have also discussed reasons to return immediately to the ER.  Patient expresses understanding and agrees with plan.                                 Medical Decision Making Amount and/or Complexity of Data Reviewed Independent Historian: friend External Data Reviewed: labs, radiology and notes. Labs: ordered. Decision-making details  documented in ED Course. Radiology: ordered and independent interpretation performed. Decision-making details documented in ED Course.  Risk OTC drugs. Prescription drug management. Decision regarding hospitalization. Diagnosis or treatment significantly limited by social determinants of health.           Final Clinical Impression(s) / ED Diagnoses Final diagnoses:  Pelvic pain    Rx / DC Orders ED Discharge Orders     None         Shayanna Thatch A, PA-C 01/25/24 1937    Carin Charleston, MD 01/25/24 [phone]

## 2024-01-25 NOTE — Discharge Instructions (Signed)
 It was a pleasure taking care of you here today.  As discussed in the room the ultrasound did not visualize your left ovary however your right ovary did have a small cyst or had good blood flow.  We are starting on anti-inflammatory medication.  Take as prescribed.  Make sure to follow back up with your OB/GYN, return for any worsening symptoms.

## 2024-01-26 ENCOUNTER — Telehealth: Payer: Self-pay | Admitting: *Deleted

## 2024-01-26 ENCOUNTER — Other Ambulatory Visit: Payer: Self-pay

## 2024-01-26 NOTE — Telephone Encounter (Signed)
 Pt called related to Rx (naproxen ) not received by her pharmacy.  RNCM reviewed chart to find that EDP did not prescribe any meds.  Relayed message to pt and advised that naproxen  is an OTC Rx and she may purchase without MD Rx.  Pt appreciative of information and states she will purchase OTC.  Kylin Genna J. Rachel Budds, RN, BSN, NCM  Transitions of Care  Nurse Case Manager  [facility] Emergency Departments  Operative Services  [phone]

## 2024-02-02 ENCOUNTER — Other Ambulatory Visit: Payer: Self-pay

## 2024-02-02 MED ORDER — TRAMADOL HCL 50 MG PO TABS
50.0000 mg | ORAL_TABLET | Freq: Four times a day (QID) | ORAL | 0 refills | Status: DC | PRN
  Filled 2024-02-02: qty 30, 7d supply, fill #0

## 2024-02-07 ENCOUNTER — Other Ambulatory Visit: Payer: Self-pay

## 2024-02-07 MED ORDER — DAPAGLIFLOZIN PROPANEDIOL 5 MG PO TABS
5.0000 mg | ORAL_TABLET | Freq: Every day | ORAL | 11 refills | Status: AC
  Filled 2024-02-07: qty 30, 30d supply, fill #0
  Filled 2024-03-05: qty 30, 30d supply, fill #1
  Filled 2024-04-03: qty 30, 30d supply, fill #2
  Filled 2024-05-10: qty 30, 30d supply, fill #3
  Filled 2024-06-11: qty 30, 30d supply, fill #4
  Filled 2024-07-17: qty 30, 30d supply, fill #5
  Filled 2024-08-16: qty 30, 30d supply, fill #6
  Filled 2024-09-19: qty 30, 30d supply, fill #7
  Filled 2024-10-23: qty 30, 30d supply, fill #8

## 2024-02-07 MED ORDER — UBRELVY 100 MG PO TABS
100.0000 mg | ORAL_TABLET | ORAL | 3 refills | Status: AC
  Filled 2024-02-07: qty 8, 15d supply, fill #0
  Filled 2024-03-05: qty 8, 30d supply, fill #1
  Filled 2024-04-03: qty 8, 30d supply, fill #2
  Filled 2024-05-10: qty 8, 30d supply, fill #3

## 2024-02-08 ENCOUNTER — Other Ambulatory Visit: Payer: Self-pay

## 2024-02-09 ENCOUNTER — Other Ambulatory Visit: Payer: Self-pay

## 2024-02-14 ENCOUNTER — Other Ambulatory Visit: Payer: Self-pay | Admitting: Obstetrics and Gynecology

## 2024-02-15 LAB — SURGICAL PATHOLOGY

## 2024-03-05 ENCOUNTER — Other Ambulatory Visit: Payer: Self-pay

## 2024-03-05 MED ORDER — MOUNJARO 5 MG/0.5ML ~~LOC~~ SOAJ
5.0000 mg | SUBCUTANEOUS | 1 refills | Status: AC
  Filled 2024-03-05: qty 2, 28d supply, fill #0
  Filled 2024-04-03: qty 2, 28d supply, fill #1

## 2024-03-08 ENCOUNTER — Other Ambulatory Visit: Payer: Self-pay

## 2024-03-09 DIAGNOSIS — E1169 Type 2 diabetes mellitus with other specified complication: Secondary | ICD-10-CM | POA: Diagnosis not present

## 2024-03-09 DIAGNOSIS — G43909 Migraine, unspecified, not intractable, without status migrainosus: Secondary | ICD-10-CM | POA: Diagnosis not present

## 2024-03-09 DIAGNOSIS — Z9689 Presence of other specified functional implants: Secondary | ICD-10-CM | POA: Diagnosis not present

## 2024-03-12 ENCOUNTER — Encounter (HOSPITAL_COMMUNITY): Payer: Self-pay | Admitting: Obstetrics and Gynecology

## 2024-03-12 NOTE — Pre-Procedure Instructions (Addendum)
 Surgical Instructions   Your procedure is scheduled on :  Tuesday, 03-20-2024. Report to [facility] Main Entrance "A" at 9:45  A.M., then check in with the Admitting office. Any questions or running late day of surgery: call [phone]  Questions prior to your surgery date: call [phone], Monday-Friday, 8am-4pm. If you experience any cold or flu symptoms such as cough, fever, chills, shortness of breath, etc. between now and your scheduled surgery, please notify your surgeon office.  ~~ Please Bring Your Spinal Cord Stimulator With You Day of Surgery and leave stimulator on.     WHAT DO I DO ABOUT MY DIABETES MEDICATION?  We spoke about oral diabetic medication Farxiga  to stop 72 hours prior to surgery last dose to have been Saturday 03-17-2024      THE MORNING OF SURGERY, take __half dose _ units of __Toujeo___insulin.   HOW TO MANAGE YOUR DIABETES BEFORE AND AFTER SURGERY  Why is it important to control my blood sugar before and after surgery? Improving blood sugar levels before and after surgery helps healing and can limit problems. A way of improving blood sugar control is eating a healthy diet by:  Eating less sugar and carbohydrates  Increasing activity/exercise  Talking with your doctor about reaching your blood sugar goals High blood sugars (greater than 180 mg/dL) can raise your risk of infections and slow your recovery, so you will need to focus on controlling your diabetes during the weeks before surgery. Make sure that the doctor who takes care of your diabetes knows about your planned surgery including the date and location.  Check your blood sugar the morning of your surgery when you wake up and every 2 hours until you get to the Short Stay unit.  If your blood sugar is less than 70 mg/dL, you will need to treat for low blood sugar: Treat a low blood sugar (less than 70 mg/dL) with  cup of clear juice (cranberry or apple). Recheck blood sugar in 15 minutes  after treatment (to make sure it is greater than 70 mg/dL). If your blood sugar is not greater than 70 mg/dL on recheck, call [phone] for further instructions. Report your blood sugar to the short stay nurse when you get to Short Stay.  If you are admitted to the hospital after surgery: Your blood sugar will be checked by the staff and you will probably be given insulin  after surgery (instead of oral diabetes medicines) to make sure you have good blood sugar levels. The goal for blood sugar control after surgery is 80-180 mg/dL.     Remember:  Do not eat any food after midnight the night before your surgery.  You may have liquids from midnight night before surgery until 8:45 AM.  Clear liquids Allowed until 8:45 AM: Water  Clear tea (may use non-sweetener, no honey, milk, etc) Black coffee (may use non-sweetener,  NO MILK OR CREAMER OF ANY TYPE, this could get you surgery postponed or cancelled) Carbonated beverages (diabetics, choose diet or no sugar options) Sport drinks, like Gatorade (diabetics, choose diet or no sugar options)  NO clear liquids after 8:45 AM day of surgery.  This includes no water,  candy,  gum,  and  mints.    Take these medicines the morning of surgery with A SIPS OF WATER : Fexofenadine (allegra) Ipratropium (atrovent ) nasal spray   May take these medicines IF NEEDED: Hydroxyzine  (atarax ) Tramadol  (ultram ) Ubrogepant  (ubrelvy )  One week prior to surgery, STOP taking any Aspirin (unless otherwise instructed  by your surgeon) Aleve , Naproxen , Ibuprofen , Motrin , Advil , Goody's, BC's, all herbal medications, fish oil, and non-prescription vitamins.                     Do NOT Smoke (Tobacco/Vaping) and Do Not drink alcohol for 24 hours prior to your procedure.  If you use a CPAP at night, you may bring your mask/headgear for your overnight stay.   You will be asked to remove any contacts, glasses, piercing's, hearing aid's, dentures/partials prior to  surgery. Please bring cases for these items if needed.    Patients discharged the day of surgery will not be allowed to drive home, and someone needs to stay with them for 24 hours.  SURGICAL WAITING ROOM VISITATION Patients may have no more than 2 support people in the waiting area - these visitors may rotate.   Pre-op nurse will coordinate an appropriate time for 1 ADULT support person, who may not rotate, to accompany patient in pre-op.  Children under the age of 72 must have an adult with them who is not the patient and must remain in the main waiting area with an adult.  If the patient needs to stay at the hospital during part of their recovery, the visitor guidelines for inpatient rooms apply.  Please refer to the [facility] website for the visitor guidelines for any additional information.   If you received a COVID test during your pre-op visit  it is requested that you wear a mask when out in public, stay away from anyone that may not be feeling well and notify your surgeon if you develop symptoms. If you have been in contact with anyone that has tested positive in the last 10 days please notify you surgeon.      Pre-operative CHG Bathing Instructions   You can play a key role in reducing the risk of infection after surgery. Your skin needs to be as free of germs as possible. You can reduce the number of germs on your skin by washing with CHG (chlorhexidine  gluconate) soap before surgery. CHG is an antiseptic soap that kills germs and continues to kill germs even after washing.   DO NOT use if you have an allergy to chlorhexidine /CHG or antibacterial soaps. If your skin becomes reddened or irritated, stop using the CHG and notify Pre-op nurse day of surgery.  ~Please get dial  soap or other antibacterial soap (no scent) and shower following the instructions below.              TAKE A SHOWER THE NIGHT BEFORE SURGERY AND THE DAY OF SURGERY    Please keep in mind the following:   DO NOT shave, including legs and underarms, 48 hours prior to surgery.   You may shave your face before/day of surgery.  Place clean sheets on your bed the night before surgery Use a clean washcloth (not used since being washed) for each shower. DO NOT sleep with pet's night before surgery.  CHG Shower Instructions:  Wash your face and private area with normal soap. If you choose to wash your hair, wash first with your normal shampoo.  After you use shampoo/soap, rinse your hair and body thoroughly to remove shampoo/soap residue.  Turn the water OFF and apply half the bottle of CHG soap to a CLEAN washcloth.  Apply CHG soap ONLY FROM YOUR NECK DOWN TO YOUR TOES (washing for 3-5 minutes)  DO NOT use CHG soap on face, private areas, open wounds, or sores.  Pay special attention to the area where your surgery is being performed.  If you are having back surgery, having someone wash your back for you may be helpful. Wait 2 minutes after CHG soap is applied, then you may rinse off the CHG soap.  Pat dry with a clean towel  Put on clean pajamas    Additional instructions for the day of surgery: DO NOT APPLY any lotions,  powder,  oils,  deodorants (may use underarm deodorant) , cologne, or perfumes or makeup Do not wear jewelry/  piercing's/  metal/  permanent jewelry must be removed prior to arrival day of surgery. (No plastic piercing) Do not wear nail polish, gel polish, artificial nails, or any other type of covering on natural finger nails (toe nails are okay) Do not bring valuables to the hospital. [facility] is not responsible for valuables/personal belongings. Put on clean/comfortable clothes.  Please brush your teeth and rinse mouth. Ask your nurse before applying any prescription medications to the skin.

## 2024-03-12 NOTE — Progress Notes (Addendum)
 Spoke w/ via phone for pre-op interview--- pt Lab needs dos----  urine preg (per anes)       Lab results------ lab appt 03-19-2024 @ 1300 getting CBC/ BMP/ T&S/ A1C/ EKG (per anes) COVID test -----patient states asymptomatic no test needed Arrive at ------- 03-20-2024 NPO after MN w/ exception sips of water with meds Pre-Surgery Ensure or G2: n/a  Med rec completed Medications to take morning of surgery ----- allegra, ipratropium nasal spray/ if needed ubrelvy , tramadol ,  Diabetic medication ----- do half dose toujeo  morning of surgery/  stop farxiga  72 hours prior to surgery per diabetes guidelines last dose to be 03-17-2024   GLP1 agonist last dose: pt stated last dose 03-03-2024 GLP1 instructions: stated was given instructions by dr Wynona Hedger to stop and aware not to do until after surgery  Patient instructed no nail polish to be worn day of surgery Patient instructed to bring photo id and insurance card day of surgery Patient aware to have Driver (ride ) / caregiver    for 24 hours after surgery - brother, jason wall Patient Special Instructions ----- will pick up bag w/ soap and written instructions at lab appt Pre-Op special Instructions ----- pt has spinal cord stimulator ,  asked to bring control with her day of surgery and to leave it on  Pt has Libre 3 Sent inbox message to Dr Wynona Hedger in epic on 03-08-2024, requested pre-op orders.  Patient verbalized understanding of instructions that were given at this phone interview. Patient denies chest pain, sob, fever, cough at the interview.    Anesthesia Review: DM 2;  CPS;  insertion spinal cord stimulator 01/ 2019 ,  requested pt bring control with her dos  PCP:  Dr Lawence Press Endocrinologist:  Dr Melven Stable. Doerr  Chest x-ray : no EKG : 10-21-2017 Echo : no Stress test:no Cardiac Cath : no  Activity level:  pt denies sob w/ any actibity Sleep Study/ CPAP : no Fasting Blood Sugar :  69--123    / Checks Blood Sugar -- times a day:   multiple times w/ Libre  Blood Thinner/ Instructions /Last Dose:  no ASA / Instructions/ Last Dose :  no

## 2024-03-18 ENCOUNTER — Other Ambulatory Visit: Payer: Self-pay | Admitting: Obstetrics and Gynecology

## 2024-03-18 DIAGNOSIS — N939 Abnormal uterine and vaginal bleeding, unspecified: Secondary | ICD-10-CM

## 2024-03-18 NOTE — H&P (Deleted)
   The note originally documented on this encounter has been moved the the encounter in which it belongs.

## 2024-03-18 NOTE — H&P (Signed)
 History of Present Illness    General:  48 y/o presents for preoperative visit. Pt is schedule for a robotic assisted laparoscopic hysterectomy with bilateral salpingectomy and right oophorectomy on 03/20/2024 for the management of AUB, pelvic pain, uterine leiomyoma, and right ovarian cyst.  IN REVIEW: She has pelvic pain and ovarian cyst. She was seen at [facility] ED 01/25/2024. U/S form that visit is reviewed. On ultrasound the uterus measures 10.8 cm x 5.1 cm x 6.3 cm small fibroids are noted the largest was 2 cm. The right ovary contains a 5 cm simple appearing cyst. The left ovary was not visualized.  She is diabetic her last hemoglobin A1c in Dr. Deatra Face office was reviewed at was 6.5.  She reported 2 menses in February. She reported cramping that started the third week in March. the cramping was constant worse some days more than others. Pain was a 7 out of 10. When she was seen at [facility] the pain was 10 out of 10. After urinating the pressure and pain improved just slightly. Today she rates pain as 5 out 10. She took naproxen  this am and pain was 7 out of 10 at that time.  Her last menses was April 1st lasting 21 days. she changes 4-5 times a day on her heaviest day. she describes her menses as moderate. Normally her menses only last 4-5 days. she is not planning on future child bearing. --- TVUS on 01/19/2024 notable for uterus measuring 8.35 x 4.88 x 5.93 cm. 4 uterine fibroids observed ranging from 1.5 to 2.1 cm. Endometrium 0.91 cm and trilayered. Right ovary contains two simple cysts - 7.1 cm and 2.8 cm, both avascular. Left ovary not visualized.  EMB on 02/14/2024 was benign. Patient reviewed a written consent for participation in the [employer] pilot program before their encounter. The patient verbally consented to use of the [employer] application for their encounter today. All questions were answered. If the patient declined consent they understand there is no consequence for  this informed decision.  Current Medications Taking Farxiga (Dapagliflozin  Propanediol) 5 MG Tablet 1 tablet Orally Once a day Ibuprofen  800 MG Tablet 1 tablet with food or milk as needed Orally every 8 hrs Mounjaro (Tirzepatide ) 5 MG/0.5ML Solution Auto-injector Inject 5 mg Subcutaneous once weekly traMADol  HCl 50 MG Tablet 1 tablet as needed Orally every 6 hours as needed for moderate to severe pain Ubrelvy (Ubrogepant ) 100 MG Tablet 1 tablet as needed, may take second dose at least 2 hours after first dose up to 2 tablets per day as needed Orally Once a day Ferrous Sulfate  325 (65 Fe) MG Tablet 1 tablet Orally Once a day Furosemide  20 MG Tablet 1 tablet as needed Orally Once a day Ondansetron  HCl 8 MG Tablet 1 tablet as needed Oral Once a day Fexofenadine HCl 180 MG Tablet 1 tablet Swallow whole with water; do not take with fruit juices. Orally Once a day FreeStyle Libre 3 Sensor(Continuous Glucose Sensor) - Miscellaneous Use 1 sensor to monitor blood sugars every 14 days every 14 days Insulin  Pen Needle 32G X 4 MM Miscellaneous Use to inject insulin  once daily Losartan  Potassium 50 mg Tablet Take 1 tablet (50 mg total) by mouth daily. Toujeo  Max SoloStar(Insulin  Glargine (2 Unit Dial )) 300 UNIT/ML Solution Pen-injector Inject 24 units Subcutaneous once daily ZyrTEC(Cetirizine HCl) 10 MG Tablet Chewable 1 tablet Orally Once a day Ipratropium Bromide  0.03 % Solution 2 sprays in each nostril Nasally Twice a day Naproxen  220 MG Tablet 1 tablet with  food or milk as needed Orally every 12 hrs Not-Taking Acetaminophen  500 MG Capsule 2 capsules as needed Orally every 8 hours Medication List reviewed and reconciled with the patient Past Medical History Migraine. Type 2 DM: Associated with hypertension, GERD, chronic pain. HTN. Chronic pain symdrome. DDD lumbar. S/p insertion of spinal cord stimulator. GERD. Surgical History Left hand ganglon cyst 1990 L5-S1 Fusion 2008 neck surgery  2015 Left shoulder surgery 2017 Neurostimulator 2018 Family History Father: deceased, He was either 35 or 98 years old from heart disease in 2015., diagnosed with Hypertension, Coronary artery disease, Diabetes Mother: deceased, She just died in 2022/01/14 at the age of 82 from presumed CHF. After a Covid vaccine in 2021, she developed multiple complications including a blood clots., diagnosed with Hypertension, Diabetes Paternal Grand Father: deceased Paternal Grand Mother: deceased Maternal Grand Father: deceased Maternal Grand Mother: deceased Brother 1: deceased, He died at the age of 48 from a ruptured blood vessel in his brain., diagnosed with Hypertension, Diabetes Brother2: alive Brother 3: alive Daughter(s): alive 3 brother(s) - healthy. 2 daughter(s) - healthy. denies hx of gyn cancers. Social History    General:  Tobacco use  cigarettes:  Never smoked, Tobacco history last updated  03/09/2024, Vaping  No. Alcohol: yes, rare. Caffeine: yes, 2 servings daily. Recreational drug use: no. Exercise: minimal. Marital Status: Single. Children: 2 girls (born in 2005 and 2017). OCCUPATION: works with [employer]. Gyn History Sexual activity not currently sexually active.  Periods : irregular.  LMP 02/06/2024- 02/20/2024.  Denies H/O Birth control.  Last pap smear date 01/2024- Uns, HPV-.  Last mammogram date Jan 15, 2023 Negative.  Denies H/O Abnormal pap smear.  Denies H/O STD.  Menarche 14.  OB History Number of pregnancies  2.  Pregnancy # 1  live birth, 7.  Pregnancy # 2  live birth, 2007.  Allergies Penicillin: hives - Allergy Tape: hives - Allergy fentaNYL : vomiting, hives - Allergy Hydrocodone: hives - Allergy Hospitalization/Major Diagnostic Procedure childbirth x2 Review of Systems    CONSTITUTIONAL:  Chills No.  Fatigue No.  Fever No.  Night sweats No.  Recent travel outside [place]  No.  Sweats No.  Weight change No.     OPHTHALMOLOGY:  Blurring of vision no.  Change in vision  no.  Double vision no.     ENT:  Dizziness no.  Nose bleeds no.  Sore throat no.  Teeth pain no.     ALLERGY:  Hives no.     CARDIOLOGY:  Chest pain no.  High blood pressure no.  Irregular heart beat no.  Leg edema no.  Palpitations no.     RESPIRATORY:  Shortness of breath no.  Cough no.  Wheezing no.     UROLOGY:  Pain with urination no.  Urinary urgency no.  Urinary frequency no.  Urinary incontinence no.  Difficulty urinating No.  Blood in urine No.     GASTROENTEROLOGY:  Abdominal pain no.  Appetite change no.  Bloating/belching no.  Blood in stool or on toilet paper no.  Change in bowel movements no.  Constipation no.  Diarrhea no.  Difficulty swallowing no.  Nausea no.     FEMALE REPRODUCTIVE:  Vulvar pain no.  Vulvar rash no.  Abnormal vaginal bleeding , yes.  Breast pain no.  Nipple discharge no.  Pain with intercourse no.  Pelvic pain , yes.  Unusual vaginal discharge no.  Vaginal itching no.     MUSCULOSKELETAL:  Muscle aches no.     NEUROLOGY:  Headache no.  Tingling/numbness no.  Weakness no.     PSYCHOLOGY:  Depression no.  Anxiety no.  Nervousness no.  Sleep disturbances no.  Suicidal ideation no .     ENDOCRINOLOGY:  Excessive thirst no.  Excessive urination no.  Hair loss no.  Heat or cold intolerance no.     HEMATOLOGY/LYMPH:  Abnormal bleeding no.  Easy bruising no.  Swollen glands no.     DERMATOLOGY:  New/changing skin lesion no.  Rash no.  Sores no.  Vital Signs Wt: 205.4, Wt change: -0.2 lbs, Ht: 66.5, BMI: 32.65, Pulse sitting: 83, BP sitting: 140/90. Examination    General Examination: CONSTITUTIONAL:  alert, oriented, NAD.  SKIN:  moist, warm.  EYES:  Conjunctiva clear.  LUNGS:  good I:E efffort noted, clear to auscultation bilaterally.  HEART:  regular rate and rhythm.  ABDOMEN:  soft, non-tender/non-distended, bowel sounds present.  FEMALE GENITOURINARY:  normal external genitalia, labia - unremarkable, vagina - pink moist mucosa, no lesions or  abnormal discharge, cervix - no discharge or lesions or CMT, adnexa - no masses or tenderness, uterus - nontender and normal size on palpation.  EXTREMITIES:  no edema present.  PSYCH:  affect normal, good eye contact.  Physical Examination    Chaperone present:  Chaperone present  Plata, Menda 03/09/2024 09:07:27 AM >, for pelvic exam.  Assessments 1. Uterine leiomyoma, unspecified location - D25.9 (Primary)    2. Abnormal uterine bleeding (AUB) - N93.9    3. Pelvic pain - R10.2    4. Right ovarian cyst - N83.201    5. Type 2 diabetes mellitus with other specified complication - E11.69    6. Spinal cord stimulator status - Z96.89    7. Migraine - G43.909    Treatment 1. Uterine leiomyoma, unspecified location        Notes: planning robotic assisted laparoscopic hysterectomy with bilateral salpingectomy and right oophorectomy ... r/b/a of surgery discussed with patient including but not limited to infection bleeding damage to bowel bladder ureters with the need for futher surgery. Risk of conversion to a larger incision was discussed. She is advised to avoid eating or drinking starting midnight prior to surgery. Pt advised to avoid NSAIDs (Aspirin, Aleve , Advil , Ibuprofen , Motrin ) from now until surgery given risk of bleeding during surgery. Pt advised she will stay overnight, or 2 days if conversion to larger incision. She is advised that in order to be discharged from hospital, she will need to be able to ambulate, urinate, tolerate food and take pain medication by mouth. Discussed post-surgery avoidance of driving for 1 week and avoidance of lifting weight greater than 10 lbs or intercourse for 6-8 weeks after procedure. Discussed risk of blood transfusion and risk of HIV or hep B&C with blood transfusion. Pt is aware of risks and desires blood transfusion if needed.   2. Abnormal uterine bleeding (AUB)        Notes: ASSESSMENT AND PLAN  1. Preoperative evaluation. Scheduled for a  robotic-assisted laparoscopic hysterectomy on 03/20/2024. Discontinue Mounjaro  one week prior to surgery, with the last dose two weeks before the procedure. Avoid Naprosyn , ibuprofen , Motrin , Aleve , and aspirin until after surgery due to their impact on platelet function and bleeding risk. Tramadol  and Tylenol  can be used for pain management. Fast from midnight the night before surgery. Potential risks including infection, bleeding, and damage to the bowel, bladder, and ureters have been discussed. Postoperative restrictions include no driving for one week, no lifting objects over 10 pounds, and  no sexual activity for at least six weeks. Inform primary care physician about the upcoming surgery. If hypoglycemia occurs, consume a small snack at 2:00 AM on the day of surgery.  .        3. Type 2 diabetes mellitus with other specified complication        Notes: 2. Type 2 diabetes mellitus. Managed by Dr. Coral Der. Inform Dr. Coral Der about the upcoming surgery to discuss any necessary adjustments in diabetes management. Monitor blood sugar levels closely and consume a small snack at 2:00 AM on the day of surgery if hypoglycemia occurs.    4. Spinal cord stimulator status        Notes: 3. Chronic pain syndrome. Spinal cord stimulator in place. Turn off the spinal cord stimulator before surgery. Continue taking tramadol  and Tylenol  for pain management as needed.

## 2024-03-19 ENCOUNTER — Encounter (HOSPITAL_COMMUNITY)
Admission: RE | Admit: 2024-03-19 | Discharge: 2024-03-19 | Disposition: A | Discharge status: Home / Self Care | Source: Ambulatory Visit | Attending: Obstetrics and Gynecology

## 2024-03-19 DIAGNOSIS — Z01818 Encounter for other preprocedural examination: Secondary | ICD-10-CM | POA: Insufficient documentation

## 2024-03-19 LAB — CBC
HCT: 37.1 % (ref 36.0–46.0)
Hemoglobin: 12 g/dL (ref 12.0–15.0)
MCH: 27.2 pg (ref 26.0–34.0)
MCHC: 32.3 g/dL (ref 30.0–36.0)
MCV: 84.1 fL (ref 80.0–100.0)
Platelets: 375 10*3/uL (ref 150–400)
RBC: 4.41 MIL/uL (ref 3.87–5.11)
RDW: 13.4 % (ref 11.5–15.5)
WBC: 8.3 10*3/uL (ref 4.0–10.5)
nRBC: 0 % (ref 0.0–0.2)

## 2024-03-19 LAB — BASIC METABOLIC PANEL WITH GFR
Anion gap: 10 (ref 5–15)
BUN: 5 mg/dL — ABNORMAL LOW (ref 6–20)
CO2: 23 mmol/L (ref 22–32)
Calcium: 9 mg/dL (ref 8.9–10.3)
Chloride: 106 mmol/L (ref 98–111)
Creatinine, Ser: 0.7 mg/dL (ref 0.44–1.00)
GFR, Estimated: >60 mL/min (ref >60)
Glucose, Bld: 163 mg/dL — ABNORMAL HIGH (ref 70–99)
Potassium: 4 mmol/L (ref 3.5–5.1)
Sodium: 139 mmol/L (ref 135–145)

## 2024-03-19 LAB — HEMOGLOBIN A1C
Hgb A1c MFr Bld: 6.4 % — ABNORMAL HIGH (ref 4.8–5.6)
Mean Plasma Glucose: 136.98 mg/dL

## 2024-03-19 LAB — TYPE AND SCREEN
ABO/RH(D): AB POS
Antibody Screen: NEGATIVE

## 2024-03-20 ENCOUNTER — Observation Stay (HOSPITAL_COMMUNITY)

## 2024-03-20 ENCOUNTER — Encounter (HOSPITAL_COMMUNITY): Payer: Self-pay | Admitting: Obstetrics and Gynecology

## 2024-03-20 ENCOUNTER — Observation Stay (HOSPITAL_COMMUNITY)
Admission: RE | Admit: 2024-03-20 | Discharge: 2024-03-20 | Disposition: A | Discharge status: Home / Self Care | Attending: Obstetrics and Gynecology | Admitting: Obstetrics and Gynecology

## 2024-03-20 ENCOUNTER — Other Ambulatory Visit: Payer: Self-pay

## 2024-03-20 ENCOUNTER — Encounter (HOSPITAL_COMMUNITY)
Admission: RE | Disposition: A | Discharge status: Home / Self Care | Payer: Self-pay | Source: Home / Self Care | Attending: Obstetrics and Gynecology

## 2024-03-20 DIAGNOSIS — E119 Type 2 diabetes mellitus without complications: Secondary | ICD-10-CM | POA: Insufficient documentation

## 2024-03-20 DIAGNOSIS — N83201 Unspecified ovarian cyst, right side: Secondary | ICD-10-CM | POA: Diagnosis not present

## 2024-03-20 DIAGNOSIS — D259 Leiomyoma of uterus, unspecified: Secondary | ICD-10-CM | POA: Diagnosis not present

## 2024-03-20 DIAGNOSIS — Z9689 Presence of other specified functional implants: Secondary | ICD-10-CM | POA: Insufficient documentation

## 2024-03-20 DIAGNOSIS — G894 Chronic pain syndrome: Secondary | ICD-10-CM | POA: Insufficient documentation

## 2024-03-20 DIAGNOSIS — J45909 Unspecified asthma, uncomplicated: Secondary | ICD-10-CM

## 2024-03-20 DIAGNOSIS — I10 Essential (primary) hypertension: Secondary | ICD-10-CM

## 2024-03-20 DIAGNOSIS — G43909 Migraine, unspecified, not intractable, without status migrainosus: Secondary | ICD-10-CM | POA: Diagnosis not present

## 2024-03-20 DIAGNOSIS — Z01818 Encounter for other preprocedural examination: Secondary | ICD-10-CM

## 2024-03-20 DIAGNOSIS — N939 Abnormal uterine and vaginal bleeding, unspecified: Principal | ICD-10-CM | POA: Insufficient documentation

## 2024-03-20 HISTORY — DX: Migraine without aura, not intractable, without status migrainosus: G43.009

## 2024-03-20 HISTORY — DX: Mixed hyperlipidemia: E78.2

## 2024-03-20 HISTORY — DX: Unspecified ovarian cyst, right side: N83.201

## 2024-03-20 HISTORY — DX: Type 2 diabetes mellitus without complications: E11.9

## 2024-03-20 HISTORY — PX: ROBOTIC ASSISTED TOTAL HYSTERECTOMY WITH BILATERAL SALPINGO OOPHERECTOMY: SHX6086

## 2024-03-20 HISTORY — DX: Presence of spectacles and contact lenses: Z97.3

## 2024-03-20 HISTORY — DX: Generalized anxiety disorder: F41.1

## 2024-03-20 HISTORY — DX: Pelvic and perineal pain: R10.2

## 2024-03-20 HISTORY — DX: Leiomyoma of uterus, unspecified: D25.9

## 2024-03-20 HISTORY — DX: Other intervertebral disc degeneration, lumbar region without mention of lumbar back pain or lower extremity pain: M51.369

## 2024-03-20 HISTORY — DX: Iron deficiency anemia, unspecified: D50.9

## 2024-03-20 HISTORY — DX: Essential (primary) hypertension: I10

## 2024-03-20 HISTORY — DX: Abnormal uterine and vaginal bleeding, unspecified: N93.9

## 2024-03-20 HISTORY — DX: Cervicalgia: M54.2

## 2024-03-20 LAB — GLUCOSE, CAPILLARY
Glucose-Capillary: 86 mg/dL (ref 70–99)
Glucose-Capillary: 119 mg/dL — ABNORMAL HIGH (ref 70–99)

## 2024-03-20 LAB — POCT PREGNANCY, URINE: Preg Test, Ur: NEGATIVE

## 2024-03-20 SURGERY — HYSTERECTOMY, TOTAL, ROBOT-ASSISTED, LAPAROSCOPIC, WITH BILATERAL SALPINGO-OOPHORECTOMY
Anesthesia: General | Site: Pelvis | Laterality: Right

## 2024-03-20 MED ORDER — PHENYLEPHRINE 80 MCG/ML (10ML) SYRINGE FOR IV PUSH (FOR BLOOD PRESSURE SUPPORT)
PREFILLED_SYRINGE | INTRAVENOUS | Status: AC
  Filled 2024-03-20: qty 10

## 2024-03-20 MED ORDER — HYDROMORPHONE HCL 2 MG PO TABS: 2.0000 mg | ORAL_TABLET | ORAL | 0 refills | Status: AC | PRN

## 2024-03-20 MED ORDER — 0.9 % SODIUM CHLORIDE (POUR BTL) OPTIME
TOPICAL | Status: DC | PRN
  Administered 2024-03-20: 1000 mL

## 2024-03-20 MED ORDER — ACETAMINOPHEN 500 MG PO TABS
ORAL_TABLET | ORAL | Status: AC
  Filled 2024-03-20: qty 2

## 2024-03-20 MED ORDER — SODIUM CHLORIDE (PF) 0.9 % IJ SOLN
INTRAMUSCULAR | Status: AC
  Filled 2024-03-20: qty 10

## 2024-03-20 MED ORDER — ROCURONIUM BROMIDE 10 MG/ML (PF) SYRINGE
PREFILLED_SYRINGE | INTRAVENOUS | Status: DC | PRN
  Administered 2024-03-20: 60 mg via INTRAVENOUS

## 2024-03-20 MED ORDER — KETOROLAC TROMETHAMINE 30 MG/ML IJ SOLN
INTRAMUSCULAR | Status: AC
  Filled 2024-03-20: qty 1

## 2024-03-20 MED ORDER — DEXAMETHASONE SODIUM PHOSPHATE 10 MG/ML IJ SOLN
INTRAMUSCULAR | Status: AC
Start: 2024-03-20 — End: 2024-03-20
  Filled 2024-03-20: qty 1

## 2024-03-20 MED ORDER — DEXMEDETOMIDINE HCL IN NACL 80 MCG/20ML IV SOLN
INTRAVENOUS | Status: DC | PRN
  Administered 2024-03-20: 8 ug via INTRAVENOUS

## 2024-03-20 MED ORDER — ROPIVACAINE HCL 5 MG/ML IJ SOLN
INTRAMUSCULAR | Status: DC | PRN
  Administered 2024-03-20: 60 mL

## 2024-03-20 MED ORDER — GABAPENTIN 300 MG PO CAPS
300.0000 mg | ORAL_CAPSULE | ORAL | Status: AC
  Administered 2024-03-20: 300 mg via ORAL

## 2024-03-20 MED ORDER — SODIUM CHLORIDE 0.9 % IR SOLN
Status: DC | PRN
Start: 2024-03-20 — End: 2024-03-20
  Administered 2024-03-20: 1000 mL

## 2024-03-20 MED ORDER — GABAPENTIN 300 MG PO CAPS
ORAL_CAPSULE | ORAL | Status: AC
  Filled 2024-03-20: qty 1

## 2024-03-20 MED ORDER — LIDOCAINE 2% (20 MG/ML) 5 ML SYRINGE
INTRAMUSCULAR | Status: DC | PRN
  Administered 2024-03-20: 60 mg via INTRAVENOUS

## 2024-03-20 MED ORDER — DROPERIDOL 2.5 MG/ML IJ SOLN: 0.6250 mg | Freq: Once | INTRAMUSCULAR | Status: DC | PRN

## 2024-03-20 MED ORDER — GENTAMICIN SULFATE 40 MG/ML IJ SOLN
5.0000 mg/kg | INTRAVENOUS | Status: AC
  Administered 2024-03-20: 465.2 mg via INTRAVENOUS
  Filled 2024-03-20: qty 11.75

## 2024-03-20 MED ORDER — HEMOSTATIC AGENTS (NO CHARGE) OPTIME
TOPICAL | Status: DC | PRN
  Administered 2024-03-20: 1 via TOPICAL

## 2024-03-20 MED ORDER — CHLORHEXIDINE GLUCONATE 0.12 % MT SOLN
15.0000 mL | Freq: Once | OROMUCOSAL | Status: AC
  Administered 2024-03-20: 15 mL via OROMUCOSAL

## 2024-03-20 MED ORDER — SILVER NITRATE-POT NITRATE 75-25 % EX MISC
CUTANEOUS | Status: DC | PRN
  Administered 2024-03-20: 2

## 2024-03-20 MED ORDER — ONDANSETRON HCL 4 MG/2ML IJ SOLN
INTRAMUSCULAR | Status: AC
  Filled 2024-03-20: qty 2

## 2024-03-20 MED ORDER — ACETAMINOPHEN 500 MG PO TABS
1000.0000 mg | ORAL_TABLET | ORAL | Status: AC
  Administered 2024-03-20: 1000 mg via ORAL

## 2024-03-20 MED ORDER — BUPIVACAINE HCL (PF) 0.25 % IJ SOLN
INTRAMUSCULAR | Status: AC
  Filled 2024-03-20: qty 30

## 2024-03-20 MED ORDER — ROCURONIUM BROMIDE 10 MG/ML (PF) SYRINGE
PREFILLED_SYRINGE | INTRAVENOUS | Status: AC
Start: 2024-03-20 — End: 2024-03-20
  Filled 2024-03-20: qty 10

## 2024-03-20 MED ORDER — ONDANSETRON HCL 4 MG/2ML IJ SOLN
INTRAMUSCULAR | Status: DC | PRN
  Administered 2024-03-20: 4 mg via INTRAVENOUS

## 2024-03-20 MED ORDER — DEXAMETHASONE SODIUM PHOSPHATE 10 MG/ML IJ SOLN
INTRAMUSCULAR | Status: DC | PRN
  Administered 2024-03-20: 10 mg via INTRAVENOUS

## 2024-03-20 MED ORDER — FENTANYL CITRATE (PF) 100 MCG/2ML IJ SOLN
INTRAMUSCULAR | Status: AC
  Filled 2024-03-20: qty 4

## 2024-03-20 MED ORDER — LIDOCAINE 2% (20 MG/ML) 5 ML SYRINGE
INTRAMUSCULAR | Status: AC
Start: 2024-03-20 — End: 2024-03-20
  Filled 2024-03-20: qty 5

## 2024-03-20 MED ORDER — CHLORHEXIDINE GLUCONATE 0.12 % MT SOLN
OROMUCOSAL | Status: AC
  Filled 2024-03-20: qty 15

## 2024-03-20 MED ORDER — BUPIVACAINE HCL (PF) 0.25 % IJ SOLN
INTRAMUSCULAR | Status: DC | PRN
  Administered 2024-03-20: 50 mL

## 2024-03-20 MED ORDER — FENTANYL CITRATE (PF) 100 MCG/2ML IJ SOLN
INTRAMUSCULAR | Status: DC | PRN
  Administered 2024-03-20: 100 ug via INTRAVENOUS

## 2024-03-20 MED ORDER — ONDANSETRON HCL 4 MG/2ML IJ SOLN: 4.0000 mg | Freq: Once | INTRAMUSCULAR | Status: DC | PRN

## 2024-03-20 MED ORDER — CLINDAMYCIN PHOSPHATE 900 MG/50ML IV SOLN
INTRAVENOUS | Status: AC
  Filled 2024-03-20: qty 50

## 2024-03-20 MED ORDER — PHENYLEPHRINE 80 MCG/ML (10ML) SYRINGE FOR IV PUSH (FOR BLOOD PRESSURE SUPPORT)
PREFILLED_SYRINGE | INTRAVENOUS | Status: DC | PRN
  Administered 2024-03-20: 40 ug via INTRAVENOUS

## 2024-03-20 MED ORDER — CLINDAMYCIN PHOSPHATE 900 MG/50ML IV SOLN
900.0000 mg | INTRAVENOUS | Status: AC
  Administered 2024-03-20: 900 mg via INTRAVENOUS

## 2024-03-20 MED ORDER — MIDAZOLAM HCL 2 MG/2ML IJ SOLN
INTRAMUSCULAR | Status: DC | PRN
  Administered 2024-03-20: 2 mg via INTRAVENOUS

## 2024-03-20 MED ORDER — ACETAMINOPHEN 500 MG PO TABS: 1000.0000 mg | ORAL_TABLET | Freq: Three times a day (TID) | ORAL | 0 refills | Status: AC | PRN

## 2024-03-20 MED ORDER — KETOROLAC TROMETHAMINE 30 MG/ML IJ SOLN
INTRAMUSCULAR | Status: DC | PRN
Start: 2024-03-20 — End: 2024-03-20
  Administered 2024-03-20: 30 mg via INTRAVENOUS

## 2024-03-20 MED ORDER — PROPOFOL 10 MG/ML IV BOLUS
INTRAVENOUS | Status: DC | PRN
  Administered 2024-03-20: 160 mg via INTRAVENOUS

## 2024-03-20 MED ORDER — ROPIVACAINE HCL 5 MG/ML IJ SOLN
INTRAMUSCULAR | Status: AC
  Filled 2024-03-20: qty 30

## 2024-03-20 MED ORDER — LACTATED RINGERS IV SOLN: INTRAVENOUS | Status: DC

## 2024-03-20 MED ORDER — IBUPROFEN 800 MG PO TABS: 800.0000 mg | ORAL_TABLET | Freq: Three times a day (TID) | ORAL | 0 refills | Status: AC | PRN

## 2024-03-20 MED ORDER — KETOROLAC TROMETHAMINE 30 MG/ML IJ SOLN: 30.0000 mg | Freq: Once | INTRAMUSCULAR | Status: DC

## 2024-03-20 MED ORDER — SUGAMMADEX SODIUM 200 MG/2ML IV SOLN
INTRAVENOUS | Status: DC | PRN
  Administered 2024-03-20: 200 mg via INTRAVENOUS

## 2024-03-20 MED ORDER — ORAL CARE MOUTH RINSE: 15.0000 mL | Freq: Once | OROMUCOSAL | Status: AC

## 2024-03-20 MED ORDER — SODIUM CHLORIDE (PF) 0.9 % IJ SOLN
INTRAMUSCULAR | Status: AC
  Filled 2024-03-20: qty 50

## 2024-03-20 MED ORDER — PROPOFOL 10 MG/ML IV BOLUS
INTRAVENOUS | Status: AC
Start: 2024-03-20 — End: 2024-03-20
  Filled 2024-03-20: qty 20

## 2024-03-20 MED ORDER — MORPHINE SULFATE (PF) 2 MG/ML IV SOLN: 1.0000 mg | INTRAVENOUS | Status: DC | PRN

## 2024-03-20 MED ORDER — SCOPOLAMINE 1 MG/3DAYS TD PT72
1.0000 | MEDICATED_PATCH | TRANSDERMAL | Status: DC
  Administered 2024-03-20: 1.5 mg via TRANSDERMAL

## 2024-03-20 MED ORDER — BUPIVACAINE LIPOSOME 1.3 % IJ SUSP
INTRAMUSCULAR | Status: AC
  Filled 2024-03-20: qty 20

## 2024-03-20 MED ORDER — SCOPOLAMINE 1 MG/3DAYS TD PT72
MEDICATED_PATCH | TRANSDERMAL | Status: AC
  Filled 2024-03-20: qty 1

## 2024-03-20 MED ORDER — MIDAZOLAM HCL 2 MG/2ML IJ SOLN
INTRAMUSCULAR | Status: AC
  Filled 2024-03-20: qty 2

## 2024-03-20 MED ORDER — POVIDONE-IODINE 10 % EX SWAB
2.0000 | Freq: Once | CUTANEOUS | Status: AC
  Administered 2024-03-20: 2 via TOPICAL

## 2024-03-20 SURGICAL SUPPLY — 57 items
APPLICATOR ARISTA FLEXITIP XL (MISCELLANEOUS) IMPLANT
BARRIER ADHS 3X4 INTERCEED (GAUZE/BANDAGES/DRESSINGS) IMPLANT
CANNULA CAP OBTURATR AIRSEAL 8 (CAP) ×1 IMPLANT
CELLS DAT CNTRL 66122 CELL SVR (MISCELLANEOUS) IMPLANT
COVER BACK TABLE 60X90IN (DRAPES) ×1 IMPLANT
COVER TIP SHEARS 8 DVNC (MISCELLANEOUS) ×1 IMPLANT
DEFOGGER SCOPE WARM SEASHARP (MISCELLANEOUS) ×1 IMPLANT
DERMABOND ADVANCED .7 DNX12 (GAUZE/BANDAGES/DRESSINGS) ×1 IMPLANT
DILATOR CANAL MILEX (MISCELLANEOUS) ×1 IMPLANT
DRAPE ARM DVNC X/XI (DISPOSABLE) ×4 IMPLANT
DRAPE COLUMN DVNC XI (DISPOSABLE) ×1 IMPLANT
DRAPE SURG IRRIG POUCH 19X23 (DRAPES) ×1 IMPLANT
DRAPE UTILITY 15X26 TOWEL STRL (DRAPES) ×1 IMPLANT
DRIVER NDL MEGA 8 DVNC XI (INSTRUMENTS) ×1 IMPLANT
DRIVER NDLE MEGA DVNC XI (INSTRUMENTS) ×1 IMPLANT
DURAPREP 26ML APPLICATOR (WOUND CARE) ×1 IMPLANT
ELECTRODE REM PT RTRN 9FT ADLT (ELECTROSURGICAL) ×1 IMPLANT
FORCEPS BPLR LNG DVNC XI (INSTRUMENTS) ×1 IMPLANT
FORCEPS PROGRASP DVNC XI (FORCEP) IMPLANT
GAUZE 4X4 16PLY ~~LOC~~+RFID DBL (SPONGE) IMPLANT
GLOVE BIOGEL M 6.5 STRL (GLOVE) ×3 IMPLANT
GLOVE BIOGEL PI IND STRL 6.5 (GLOVE) ×3 IMPLANT
GRASPER COBRA DVNC RU (INSTRUMENTS) ×1 IMPLANT
HEMOSTAT ARISTA ABSORB 3G PWDR (HEMOSTASIS) IMPLANT
HIBICLENS CHG 4% 4OZ BTL (MISCELLANEOUS) ×2 IMPLANT
IRRIGATION STRYKERFLOW (MISCELLANEOUS) ×1 IMPLANT
KIT PINK PAD W/HEAD ARM REST (MISCELLANEOUS) ×1 IMPLANT
LEGGING LITHOTOMY PAIR STRL (DRAPES) ×1 IMPLANT
MANIPULATOR ADVINCU DEL 2.5 PL (MISCELLANEOUS) IMPLANT
MANIPULATOR ADVINCU DEL 3.0 PL (MISCELLANEOUS) IMPLANT
MANIPULATOR ADVINCU DEL 3.5 PL (MISCELLANEOUS) IMPLANT
MANIPULATOR ADVINCU DEL 4.0 PL (MISCELLANEOUS) IMPLANT
OBTURATOR OPTICALSTD 8 DVNC (TROCAR) ×1 IMPLANT
OCCLUDER COLPOPNEUMO (BALLOONS) IMPLANT
PACK ROBOT WH (CUSTOM PROCEDURE TRAY) ×1 IMPLANT
PACK ROBOTIC GOWN (GOWN DISPOSABLE) ×1 IMPLANT
PAD OB MATERNITY 11 LF (PERSONAL CARE ITEMS) ×1 IMPLANT
POWDER SURGICEL 3.0 GRAM (HEMOSTASIS) IMPLANT
RETRACTOR WND ALEXIS 18 MED (MISCELLANEOUS) IMPLANT
SCISSORS LAP 5X45 EPIX DISP (ENDOMECHANICALS) ×1 IMPLANT
SCISSORS MNPLR CVD DVNC XI (INSTRUMENTS) ×1 IMPLANT
SEAL UNIV 5-12 XI (MISCELLANEOUS) ×2 IMPLANT
SEALER VESSEL EXT DVNC XI (MISCELLANEOUS) IMPLANT
SET IRRIG Y TYPE TUR BLADDER L (SET/KITS/TRAYS/PACK) IMPLANT
SET TRI-LUMEN FLTR TB AIRSEAL (TUBING) IMPLANT
SET TUBE FILTERED XL AIRSEAL (SET/KITS/TRAYS/PACK) ×1 IMPLANT
SPIKE FLUID TRANSFER (MISCELLANEOUS) ×2 IMPLANT
SUT VIC AB 0 CT1 27XBRD ANBCTR (SUTURE) ×2 IMPLANT
SUT VIC AB 3-0 SH 27X BRD (SUTURE) IMPLANT
SUT VICRYL 0 UR6 27IN ABS (SUTURE) IMPLANT
SUT VICRYL RAPIDE 4/0 PS 2 (SUTURE) ×2 IMPLANT
SUT VLOC 180 0 9IN GS21 (SUTURE) ×1 IMPLANT
TIP ENDOSCOPIC SURGICEL (TIP) IMPLANT
TOWEL GREEN STERILE (TOWEL DISPOSABLE) ×1 IMPLANT
TROCAR PORT AIRSEAL 8X120 (TROCAR) IMPLANT
UNDERPAD 30X36 HEAVY ABSORB (UNDERPADS AND DIAPERS) ×1 IMPLANT
WATER STERILE IRR 1000ML POUR (IV SOLUTION) ×1 IMPLANT

## 2024-03-20 NOTE — Discharge Instructions (Signed)
 Post Anesthesia Home Care Instructions  Activity: Get plenty of rest for the remainder of the day. A responsible individual must stay with you for 24 hours following the procedure.  For the next 24 hours, DO NOT: -Drive a car -Advertising copywriter -Drink alcoholic beverages -Take any medication unless instructed by your physician -Make any legal decisions or sign important papers.  Meals: Start with liquid foods such as gelatin or soup. Progress to regular foods as tolerated. Avoid greasy, spicy, heavy foods. If nausea and/or vomiting occur, drink only clear liquids until the nausea and/or vomiting subsides. Call your physician if vomiting continues.  Special Instructions/Symptoms: Your throat may feel dry or sore from the anesthesia or the breathing tube placed in your throat during surgery. If this causes discomfort, gargle with warm salt water. The discomfort should disappear within 24 hours.  If you had a scopolamine patch placed behind your ear for the management of post- operative nausea and/or vomiting:  1. The medication in the patch is effective for 72 hours, after which it should be removed.  Wrap patch in a tissue and discard in the trash. Wash hands thoroughly with soap and water. 2. You may remove the patch earlier than 72 hours if you experience unpleasant side effects which may include dry mouth, dizziness or visual disturbances. 3. Avoid touching the patch. Wash your hands with soap and water after contact with the patch.    Remove patch behind right ear by Thursday, March 22, 2024.  Information for Discharge Teaching: EXPAREL  (bupivacaine  liposome injectable suspension)   Pain relief is important to your recovery. The goal is to control your pain so you can move easier and return to your normal activities as soon as possible after your procedure. Your physician may use several types of medicines to manage pain, swelling, and more.  Your surgeon or anesthesiologist gave  you EXPAREL (bupivacaine ) to help control your pain after surgery.  EXPAREL  is a local anesthetic designed to release slowly over an extended period of time to provide pain relief by numbing the tissue around the surgical site. EXPAREL  is designed to release pain medication over time and can control pain for up to 72 hours. Depending on how you respond to EXPAREL , you may require less pain medication during your recovery. EXPAREL  can help reduce or eliminate the need for opioids during the first few days after surgery when pain relief is needed the most. EXPAREL  is not an opioid and is not addictive. It does not cause sleepiness or sedation.   Important! A teal colored band has been placed on your arm with the date, time and amount of EXPAREL  you have received. Please leave this armband in place for the full 96 hours following administration, and then you may remove the band. If you return to the hospital for any reason within 96 hours following the administration of EXPAREL , the armband provides important information that your health care providers to know, and alerts them that you have received this anesthetic.    Possible side effects of EXPAREL : Temporary loss of sensation or ability to move in the area where medication was injected. Nausea, vomiting, constipation Rarely, numbness and tingling in your mouth or lips, lightheadedness, or anxiety may occur. Call your doctor right away if you think you may be experiencing any of these sensations, or if you have other questions regarding possible side effects.  Follow all other discharge instructions given to you by your surgeon or nurse. Eat a healthy diet and drink plenty  of water or other fluids. Do not remove green armband before Saturday, March 24, 2024.

## 2024-03-20 NOTE — Transfer of Care (Signed)
 Immediate Anesthesia Transfer of Care Note  Patient: Natalie Yoder  Procedure(s) Performed: HYSTERECTOMY, TOTAL, ROBOT-ASSISTED, LAPAROSCOPIC, WITH RIGHT SALPINGO-OOPHORECTOMY (Right: Pelvis)  Patient Location: PACU  Anesthesia Type:General  Level of Consciousness: oriented and patient cooperative  Airway & Oxygen Therapy: Patient Spontanous Breathing  Post-op Assessment: Report given to RN, Post -op Vital signs reviewed and stable, Patient moving all extremities X 4, and Patient able to stick tongue midline  Post vital signs: Reviewed and stable  Last Vitals:  Vitals Value Taken Time  BP 134/63 03/20/24 13:34  Temp 97.8   Pulse 77 03/20/24 13:36  Resp 15 03/20/24 13:36  SpO2 95 % 03/20/24 13:36  Vitals shown include unfiled device data.  Last Pain:  Vitals:   03/20/24 1008  TempSrc:   PainSc: 6       Patients Stated Pain Goal: 5 (03/20/24 1003)  Complications: No notable events documented.

## 2024-03-20 NOTE — Anesthesia Preprocedure Evaluation (Addendum)
 Anesthesia Evaluation  Patient identified by MRN, date of birth, ID band Patient awake    Reviewed: Allergy & Precautions, NPO status , Patient's Chart, lab work & pertinent test results  Airway Mallampati: II       Dental no notable dental hx. (+) Teeth Intact, Dental Advisory Given   Pulmonary asthma    Pulmonary exam normal breath sounds clear to auscultation       Cardiovascular hypertension, Pt. on medications Normal cardiovascular exam Rhythm:Regular Rate:Normal     Neuro/Psych  Headaches PSYCHIATRIC DISORDERS Anxiety      Neuromuscular disease    GI/Hepatic negative GI ROS, Neg liver ROS,,,  Endo/Other  diabetes, Well Controlled, Type 2, Insulin  Dependent  Obesity GLP-1 RA therapy- last dose Last HBA1c -6.4 on 6/16  Renal/GU negative Renal ROS  negative genitourinary   Musculoskeletal  (+)  Fibromyalgia -LBP   Abdominal  (+) + obese  Peds  Hematology  (+) Blood dyscrasia, anemia   Anesthesia Other Findings   Reproductive/Obstetrics AUB Pelvic pain Right ovarian cyst Uterine leiomyoma                             Anesthesia Physical Anesthesia Plan  ASA: 2  Anesthesia Plan: General   Post-op Pain Management: Dilaudid  IV, Tylenol  PO (pre-op)*, Precedex and Minimal or no pain anticipated   Induction: Intravenous  PONV Risk Score and Plan: Treatment may vary due to age or medical condition, Scopolamine patch - Pre-op, Midazolam , Ondansetron  and Dexamethasone   Airway Management Planned: Oral ETT  Additional Equipment: None  Intra-op Plan:   Post-operative Plan: Extubation in OR  Informed Consent: I have reviewed the patients History and Physical, chart, labs and discussed the procedure including the risks, benefits and alternatives for the proposed anesthesia with the patient or authorized representative who has indicated his/her understanding and acceptance.      Dental advisory given  Plan Discussed with: CRNA and Anesthesiologist  Anesthesia Plan Comments:         Anesthesia Quick Evaluation

## 2024-03-20 NOTE — H&P (Signed)
 Date of Initial H&P: 03/18/2024  History reviewed, patient examined, no change in status, stable for surgery.

## 2024-03-20 NOTE — Anesthesia Postprocedure Evaluation (Signed)
 Anesthesia Post Note  Patient: Natalie Yoder  Procedure(s) Performed: HYSTERECTOMY, TOTAL, ROBOT-ASSISTED, LAPAROSCOPIC, WITH RIGHT SALPINGO-OOPHORECTOMY (Right: Pelvis)     Patient location during evaluation: PACU Anesthesia Type: General Level of consciousness: awake and alert and oriented Pain management: pain level controlled Vital Signs Assessment: post-procedure vital signs reviewed and stable Respiratory status: spontaneous breathing, nonlabored ventilation and respiratory function stable Cardiovascular status: blood pressure returned to baseline and stable Postop Assessment: no apparent nausea or vomiting Anesthetic complications: no   No notable events documented.  Last Vitals:  Vitals:   03/20/24 1345 03/20/24 1400  BP: 130/68 110/89  Pulse: 72 83  Resp: 17 18  Temp:    SpO2: 92% 96%    Last Pain:  Vitals:   03/20/24 1008  TempSrc:   PainSc: 6                  Kanijah Groseclose A.

## 2024-03-20 NOTE — Anesthesia Procedure Notes (Signed)
 Procedure Name: Intubation Date/Time: 03/20/2024 11:52 AM  Performed by: Andee Bamberger, CRNAPre-anesthesia Checklist: Patient identified, Emergency Drugs available, Suction available and Patient being monitored Patient Re-evaluated:Patient Re-evaluated prior to induction Oxygen Delivery Method: Circle system utilized Preoxygenation: Pre-oxygenation with 100% oxygen Induction Type: IV induction Ventilation: Mask ventilation without difficulty Laryngoscope Size: Mac and 3 Grade View: Grade I Tube type: Oral Tube size: 7.0 mm Number of attempts: 1 Airway Equipment and Method: Stylet and Oral airway Placement Confirmation: ETT inserted through vocal cords under direct vision, positive ETCO2 and breath sounds checked- equal and bilateral Secured at: 22 cm Tube secured with: Tape Dental Injury: Teeth and Oropharynx as per pre-operative assessment

## 2024-03-20 NOTE — Op Note (Signed)
 03/20/2024  1:52 PM  PATIENT:  Natalie Yoder  48 y.o. female  PRE-OPERATIVE DIAGNOSIS:  Abnormal uterine bleeding  Pelvic pain  Cyst of right ovary  Uterine leiomyoma unspecified location  POST-OPERATIVE DIAGNOSIS:  Abnormal uterine bleeding Pelvic pain Cyst of right ovary Uterine leiomyoma unspecified location  PROCEDURE:  Procedure(s): HYSTERECTOMY, TOTAL, ROBOT-ASSISTED, LAPAROSCOPIC, WITH RIGHT SALPINGO-OOPHORECTOMY (Right)  SURGEON:  Surgeons and Role:    Arlee Lace, MD - Primary  PHYSICIAN ASSISTANT:   ASSISTANTS: Lori Rondo RNFA An experienced assistant was required given the standard of surgical care given the complexity of the case.  This assistant was needed for exposure, dissection, suctioning, retraction, instrument exchange, assisting with delivery with administration of fundal pressure, and for overall help during the procedure.    ANESTHESIA:   general  EBL:  35 mL   BLOOD ADMINISTERED:none  DRAINS: Urinary Catheter (Foley)   LOCAL MEDICATIONS USED:  MARCAINE  with Exparel  ... Ropivicaine  SPECIMEN:  Source of Specimen:  Uterus cervix and right fallopian tube and ovary   DISPOSITION OF SPECIMEN:  PATHOLOGY  COUNTS:  YES  TOURNIQUET:  * No tourniquets in log *  DICTATION: .Note written in EPIC  PLAN OF CARE: Admit for overnight observation  PATIENT DISPOSITION:  PACU - hemodynamically stable.   Delay start of Pharmacological VTE agent (>24hrs) due to surgical blood loss or risk of bleeding: not applicable  Findings: The left followpian tube and ovary were absent. The right ovary contained a complex appearing cyst. The right fallopian tube appeared normal. The Uterus was normal size.   Procedure: The patient was taken to the operating room #6 [facility]  where she was placed under general anesthesia.Time out was performed. Aaron Aas She was placed in dorsal lithotomy position and prepped and draped in the usual sterile fashion. A weighted speculum  was placed into the vagina. A Deaver was placed anteriorly for retraction. The anterior lip of the cervix was grasped with a single-tooth tenaculum. The vaginal mucosa was injected with 2.5 cc of exparil/marcaine  at the 2/4/ 8 and 10 o'clock positions. The uterus was sounded to 7 cm. the cervix was dilated to 6 mm . 0 vicryl suture placed at the 12  position Of the cervix to facilitate placement of a uterine manipulator. The manipulator was placed without difficulty. Weighted speculum and Deaver were removed .   Attention was turned to the patient's abdomen where a 8 mm trocar was placed  at the umbilicus under direct visualization . The pneumoperitoneum was achieved with PCO2 gas.  An 8 mm trocar was placed in the right upper quadrant 10 centimeters from the umbilicus.later connected to robotic arm #3). An 8 mm incision was made in the left upper quadrant 10 cm from the umbilicus and connected to robot arm #1. ( All incision sites were injected with 10cc of exparel /marcaine  prior to port placement. )   Once all ports had been placed under direct visualization.The laparoscope was removed and the [employer] robotic system was then  docked. The robotic arms were connected to the corresponding trocars as listed above. The laparoscope was then reinserted. The long tip bipolar forceps were placed into port #1. A vessel sealer was placed in port #3. All instruments were directed into the pelvis under direct visualization.  Attention was turned to the surgeons console.. The  left broad ligament was cauterized and transected with the vessel sealer .The round ligament was cauterized and transected with the vessel sealer  The anterior leaf of  broad ligament was incised along the bladder reflection to the midline.  The right  infundibulo-pelvic ligament ligament was cauterized and transected with the vessel sealer. The right broad ligament was cauterized and transected with the vessel sealer. The right round ligament was  cauterized and transected with the vessel sealer The broad ligament was incised to the midline. The bladder was dissected off the lower uterine segments of the cervix via sharp and blunt dissection.   The uterine arteries were skeleton bilaterally. They were  cauterized and transected with the vessel sealer.  The KOH ring was identified. The anterior colpotomy was performed followed by the posterior colpotomy. Once the uterus and cervix were completely excised,  they were removed through the vagina. The  bipolar forceps and scissors were removed and cobra forceps were placed in the port #1 and the mega needle driver was placed in to port #3.  The vaginal cuff was closed with running suture if 0 v-lock. The pelvis was irrigated. Aaron AasAaron AasAaron AasExcellent hemostasis was noted.  Surgicel was placed along the vaginal cuff.  All pelvic pedicles were examined and hemostasis was noted.  All instruments removed from the ports. All ports were removed under direct Visualization. The pneumoperitoneum was released. The skin incisions were closed with 4-0 Vicryl and then covered with Derma bond.     Sponge lap and needle counts weIre correct x 2. The patient was awakened from anesthesia and taken to the recovery room in stable condition.

## 2024-03-21 ENCOUNTER — Encounter (HOSPITAL_COMMUNITY): Payer: Self-pay | Admitting: Obstetrics and Gynecology

## 2024-03-22 LAB — SURGICAL PATHOLOGY

## 2024-04-03 ENCOUNTER — Other Ambulatory Visit: Payer: Self-pay

## 2024-04-03 DIAGNOSIS — R232 Flushing: Secondary | ICD-10-CM | POA: Diagnosis not present

## 2024-04-04 ENCOUNTER — Other Ambulatory Visit: Payer: Self-pay

## 2024-04-05 ENCOUNTER — Other Ambulatory Visit: Payer: Self-pay

## 2024-04-18 ENCOUNTER — Other Ambulatory Visit: Payer: Self-pay

## 2024-04-24 ENCOUNTER — Other Ambulatory Visit: Payer: Self-pay

## 2024-04-25 NOTE — Discharge Summary (Signed)
 Physician Discharge Summary  Patient ID: Natalie Yoder MRN: [id number] DOB/AGE: December 02, 1975 48 y.o.  Admit date: 03/20/2024 Discharge date:03/20/2024  Admission Diagnoses:  1 Abnormal uterine bleeding  2 Pelvic pain  3 Cyst of right ovary 4  Uterine leiomyoma unspecified location  Discharge Diagnoses: Same as admission diagnosis  Principal Problem:   Abnormal uterine bleeding (AUB)   Discharged Condition: good  Hospital Course: pt was admitted for observation after undergoing a robotic assisted laparoscopic total hysterectomy with Right salpingo-oophorectomy . She was discharged home on pod #0 in stable condition. Prior to discharge her pain was well controlled. She was ambulating and voiding without difficulty.  She was tolerating po.   Consults: None  Significant Diagnostic Studies: None  Treatments: surgery: HYSTERECTOMY, TOTAL, ROBOT-ASSISTED, LAPAROSCOPIC, WITH RIGHT SALPINGO-OOPHORECTOMY   Discharge Exam: Blood pressure (!) 145/76, pulse 78, temperature 97.6 F (36.4 C), resp. rate 20, height 5' 6.5 (1.689 m), weight 93 kg, last menstrual period 02/06/2024, SpO2 97%. General appearance: alert, cooperative, and no distress Resp: no distress  GI: soft appropriately tender nondistended no rebound no guarding  Extremities: extremities normal, atraumatic, no cyanosis or edema Incision/Wound:  Disposition: Discharge disposition: 01-Home or Self Care       Discharge Instructions     Call MD for:  persistant nausea and vomiting   Complete by: As directed    Call MD for:  redness, tenderness, or signs of infection (pain, swelling, redness, odor or green/yellow discharge around incision site)   Complete by: As directed    Call MD for:  severe uncontrolled pain   Complete by: As directed    Call MD for:  temperature >100.4   Complete by: As directed    Diet Carb Modified   Complete by: As directed    Driving Restrictions   Complete by: As directed    Avoid driving  for 1 week   Increase activity slowly   Complete by: As directed    Lifting restrictions   Complete by: As directed    Avoid lifting over 10 lbs for 6 weeks   May shower / Bathe   Complete by: As directed    May walk up steps   Complete by: As directed    No wound care   Complete by: As directed    Sexual Activity Restrictions   Complete by: As directed    Avoid sex for 6 weeks      Allergies as of 03/20/2024       Reactions   Fentanyl  Nausea And Vomiting, Hives, Itching   Makes her fall and can not keep anything on her stomach   Hydrocodone Hives, Itching   Methocarbamol  Hives, Itching   Penicillins Itching, Rash, Hives   Has patient had a PCN reaction causing immediate rash, facial/tongue/throat swelling, SOB or lightheadedness with hypotension: no Has patient had a PCN reaction causing severe rash involving mucus membranes or skin necrosis: No Has patient had a PCN reaction that required hospitalization No Has patient had a PCN reaction occurring within the last 10 years: Yes If all of the above answers are NO, then may proceed with Cephalosporin use.   Adhesive [tape] Hives, Itching   Paper tape better   Oxycodone  Hcl Itching, Nausea Only   Exenatide Rash   BYETTA        Medication List     STOP taking these medications    meloxicam  15 MG tablet Commonly known as: MOBIC    naproxen  sodium 220 MG tablet Commonly known as:  ALEVE    traMADol  50 MG tablet Commonly known as: ULTRAM        TAKE these medications    Acetaminophen  Extra Strength 500 MG Caps Take 1,000 mg by mouth every 8 (eight) hours. What changed: additional instructions Notes to patient: Dose given before surgery at 10:08.   acetaminophen  500 MG tablet Commonly known as: TYLENOL  Take 2 tablets (1,000 mg total) by mouth every 8 (eight) hours as needed for mild pain (pain score 1-3). What changed: You were already taking a medication with the same name, and this prescription was added.  Make sure you understand how and when to take each.   BD Pen Needle Nano U/F 32G X 4 MM Misc Generic drug: Insulin  Pen Needle USE AS DIRECTED   cetirizine 10 MG tablet Commonly known as: ZYRTEC Take 10 mg by mouth at bedtime.   Farxiga  5 MG Tabs tablet Generic drug: dapagliflozin  propanediol Take 1 tablet (5 mg total) by mouth daily.   FeroSul 325 (65 Fe) MG tablet Generic drug: ferrous sulfate  Take 1 tablet (325 mg total) by mouth daily.   fexofenadine 180 MG tablet Commonly known as: ALLEGRA Take 180 mg by mouth daily.   FreeStyle Libre 2 Sensor Misc USE AS DIRECTED , CHANGE EVERY 14 DAYS   FreeStyle Libre 3 Sensor Misc Use 1 sensor to monitor blood sugars every 14 days   furosemide  20 MG tablet Commonly known as: LASIX  Take 1 tablet (20 mg total) by mouth daily. What changed:  when to take this reasons to take this   glucose blood test strip Commonly known as: ONE TOUCH ULTRA TEST Use as instructed   HYDROmorphone  2 MG tablet Commonly known as: Dilaudid  Take 1 tablet (2 mg total) by mouth every 4 (four) hours as needed for severe pain (pain score 7-10) or moderate pain (pain score 4-6).   hydrOXYzine  25 MG tablet Commonly known as: ATARAX  Take 1 tablet (25 mg total) by mouth up to 3 (three) times daily for itching/rash.   ibuprofen  800 MG tablet Commonly known as: ADVIL  Take 1 tablet (800 mg total) by mouth every 8 (eight) hours as needed. What changed:  when to take this reasons to take this Notes to patient: Dose given in surgery at 1:11 PM.   ipratropium 0.03 % nasal spray Commonly known as: ATROVENT  Place 2 sprays into the nose 2 (two) times daily.   losartan  50 MG tablet Commonly known as: COZAAR  Take 1 tablet (50 mg total) by mouth daily.   Mounjaro  5 MG/0.5ML Pen Generic drug: tirzepatide  Inject 5 mg into the skin once a week. What changed: additional instructions   multivitamin with minerals Tabs tablet Take 1 tablet by mouth daily.    ondansetron  8 MG tablet Commonly known as: ZOFRAN  Take 1 tablet (8 mg total) by mouth 3 (three) times daily for nausea for 10 days. What changed:  how much to take how to take this when to take this reasons to take this   onetouch ultrasoft lancets Use as instructed   Toujeo  Max SoloStar 300 UNIT/ML Solostar Pen Generic drug: insulin  glargine (2 Unit Dial ) Inject 40 units under the skin once daily 90 days What changed:  how much to take when to take this   Ubrelvy  100 MG Tabs Generic drug: Ubrogepant  Take 1 tablet by mouth at onset as needed, may take second dose at least 2 hours after first dose up to 2 tablets per day as needed.        Follow-up  Information     Rosalva Sawyer, MD. Go in 2 week(s).   Specialty: Obstetrics and Gynecology Contact information: [address] [place] [address] [phone]                 Signed: Sawyer Rosalva 04/25/2024, 11:52 AM

## 2024-05-08 DIAGNOSIS — E559 Vitamin D deficiency, unspecified: Secondary | ICD-10-CM | POA: Diagnosis not present

## 2024-05-08 DIAGNOSIS — I10 Essential (primary) hypertension: Secondary | ICD-10-CM | POA: Diagnosis not present

## 2024-05-08 DIAGNOSIS — E1169 Type 2 diabetes mellitus with other specified complication: Secondary | ICD-10-CM | POA: Diagnosis not present

## 2024-05-09 ENCOUNTER — Other Ambulatory Visit: Payer: Self-pay

## 2024-05-09 MED ORDER — MOUNJARO 7.5 MG/0.5ML ~~LOC~~ SOAJ
7.5000 mg | SUBCUTANEOUS | 3 refills | Status: DC
  Filled 2024-05-09: qty 2, 28d supply, fill #0
  Filled 2024-06-11: qty 2, 28d supply, fill #1
  Filled 2024-07-17: qty 2, 28d supply, fill #2
  Filled 2024-08-16: qty 2, 28d supply, fill #3

## 2024-05-10 ENCOUNTER — Other Ambulatory Visit: Payer: Self-pay

## 2024-05-10 MED ORDER — ESTRADIOL 0.05 MG/24HR TD PTTW
1.0000 | MEDICATED_PATCH | TRANSDERMAL | 11 refills | Status: AC
  Filled 2024-05-10: qty 8, 28d supply, fill #0
  Filled 2024-07-17 – 2024-09-19 (×2): qty 8, 28d supply, fill #1

## 2024-05-11 ENCOUNTER — Other Ambulatory Visit: Payer: Self-pay

## 2024-05-11 MED ORDER — FREESTYLE LIBRE 3 PLUS SENSOR MISC
11 refills | Status: AC
  Filled 2024-05-11: qty 2, 30d supply, fill #0
  Filled 2024-07-17: qty 2, 30d supply, fill #1
  Filled 2024-08-16: qty 2, 30d supply, fill #2
  Filled 2024-09-19: qty 2, 30d supply, fill #3

## 2024-05-17 ENCOUNTER — Other Ambulatory Visit: Payer: Self-pay

## 2024-05-22 ENCOUNTER — Other Ambulatory Visit: Payer: Self-pay

## 2024-05-22 DIAGNOSIS — E1169 Type 2 diabetes mellitus with other specified complication: Secondary | ICD-10-CM | POA: Diagnosis not present

## 2024-05-22 DIAGNOSIS — H6121 Impacted cerumen, right ear: Secondary | ICD-10-CM | POA: Diagnosis not present

## 2024-05-22 DIAGNOSIS — H6501 Acute serous otitis media, right ear: Secondary | ICD-10-CM | POA: Diagnosis not present

## 2024-05-22 DIAGNOSIS — H9201 Otalgia, right ear: Secondary | ICD-10-CM | POA: Diagnosis not present

## 2024-05-22 MED ORDER — DOXYCYCLINE HYCLATE 100 MG PO TABS
100.0000 mg | ORAL_TABLET | Freq: Two times a day (BID) | ORAL | 0 refills | Status: AC
  Filled 2024-05-22: qty 20, 10d supply, fill #0

## 2024-05-23 ENCOUNTER — Other Ambulatory Visit: Payer: Self-pay

## 2024-05-24 ENCOUNTER — Other Ambulatory Visit: Payer: Self-pay

## 2024-05-30 ENCOUNTER — Other Ambulatory Visit: Payer: Self-pay

## 2024-06-11 ENCOUNTER — Other Ambulatory Visit: Payer: Self-pay

## 2024-06-11 MED ORDER — LOSARTAN POTASSIUM 50 MG PO TABS
50.0000 mg | ORAL_TABLET | Freq: Every day | ORAL | 5 refills | Status: AC
  Filled 2024-06-11: qty 30, 30d supply, fill #0
  Filled 2024-07-17: qty 30, 30d supply, fill #1
  Filled 2024-08-16: qty 30, 30d supply, fill #2
  Filled 2024-09-19: qty 30, 30d supply, fill #3
  Filled 2024-10-23: qty 30, 30d supply, fill #4

## 2024-06-12 ENCOUNTER — Other Ambulatory Visit: Payer: Self-pay

## 2024-06-14 ENCOUNTER — Other Ambulatory Visit: Payer: Self-pay

## 2024-07-17 ENCOUNTER — Other Ambulatory Visit: Payer: Self-pay

## 2024-07-17 MED ORDER — FREESTYLE LIBRE 3 PLUS SENSOR MISC
11 refills | Status: AC
  Filled 2024-07-17 – 2024-10-23 (×2): qty 2, 30d supply, fill #0

## 2024-07-18 ENCOUNTER — Other Ambulatory Visit: Payer: Self-pay

## 2024-08-14 NOTE — Progress Notes (Addendum)
 Marina Boerner                                          MRN: [id number]   09/12/2024   The [facility] Quality Team Specialist reviewed this patient medical record for the purposes of chart review for care gap closure. The following were reviewed: chart review for care gap closure-kidney health evaluation for diabetes:uACR.    [facility] Quality Team

## 2024-08-16 ENCOUNTER — Other Ambulatory Visit: Payer: Self-pay

## 2024-09-19 ENCOUNTER — Other Ambulatory Visit: Payer: Self-pay

## 2024-09-19 MED ORDER — MOUNJARO 7.5 MG/0.5ML ~~LOC~~ SOAJ
7.5000 mg | SUBCUTANEOUS | 3 refills | Status: AC
  Filled 2024-09-19: qty 2, 28d supply, fill #0

## 2024-09-19 MED ORDER — FUROSEMIDE 20 MG PO TABS
20.0000 mg | ORAL_TABLET | Freq: Every day | ORAL | 3 refills | Status: AC
  Filled 2024-09-19: qty 30, 30d supply, fill #0
  Filled 2024-10-23: qty 30, 30d supply, fill #1

## 2024-09-20 ENCOUNTER — Other Ambulatory Visit: Payer: Self-pay

## 2024-09-21 ENCOUNTER — Other Ambulatory Visit: Payer: Self-pay

## 2024-10-23 ENCOUNTER — Other Ambulatory Visit: Payer: Self-pay

## 2024-10-23 MED ORDER — TOUJEO MAX SOLOSTAR 300 UNIT/ML ~~LOC~~ SOPN
PEN_INJECTOR | Freq: Every day | SUBCUTANEOUS | 3 refills | Status: AC
  Filled 2024-10-23: qty 12, 30d supply, fill #0

## 2024-11-02 ENCOUNTER — Other Ambulatory Visit: Payer: Self-pay
